# Patient Record
Sex: Male | Born: 1950 | Race: White | Hispanic: No | Smoking: Current every day smoker
Health system: Southern US, Community
[De-identification: ages and names within clinical notes are randomized; demographics above are authoritative.]

## PROBLEM LIST (undated history)

## (undated) DIAGNOSIS — F1010 Alcohol abuse, uncomplicated: Secondary | ICD-10-CM

## (undated) DIAGNOSIS — N400 Enlarged prostate without lower urinary tract symptoms: Secondary | ICD-10-CM

## (undated) DIAGNOSIS — K219 Gastro-esophageal reflux disease without esophagitis: Secondary | ICD-10-CM

## (undated) HISTORY — PX: CHOLECYSTECTOMY: SHX55

---

## 1998-04-15 ENCOUNTER — Emergency Department (HOSPITAL_COMMUNITY): Admission: EM | Admit: 1998-04-15 | Discharge: 1998-04-15 | Payer: Self-pay | Admitting: Emergency Medicine

## 2009-08-29 ENCOUNTER — Inpatient Hospital Stay: Payer: Self-pay | Admitting: Psychiatry

## 2012-04-21 ENCOUNTER — Emergency Department: Payer: Self-pay | Admitting: Unknown Physician Specialty

## 2012-04-21 LAB — BASIC METABOLIC PANEL
Anion Gap: 7 (ref 7–16)
BUN: 9 mg/dL (ref 7–18)
Chloride: 108 mmol/L — ABNORMAL HIGH (ref 98–107)
EGFR (African American): >60
EGFR (Non-African Amer.): >60
Glucose: 85 mg/dL (ref 65–99)
Osmolality: 283 (ref 275–301)
Potassium: 4 mmol/L (ref 3.5–5.1)

## 2012-04-21 LAB — CBC WITH DIFFERENTIAL/PLATELET
Basophil %: 1.3 %
Eosinophil %: 0.7 %
HCT: 40.6 % (ref 40.0–52.0)
HGB: 14.1 g/dL (ref 13.0–18.0)
Lymphocyte #: 2 10*3/uL (ref 1.0–3.6)
MCH: 33.5 pg (ref 26.0–34.0)
MCV: 96 fL (ref 80–100)
Monocyte #: 1.1 x10 3/mm — ABNORMAL HIGH (ref 0.2–1.0)
Neutrophil #: 7.1 10*3/uL — ABNORMAL HIGH (ref 1.4–6.5)
Neutrophil %: 68.2 %
Platelet: 376 10*3/uL (ref 150–440)
RBC: 4.21 10*6/uL — ABNORMAL LOW (ref 4.40–5.90)

## 2012-04-21 LAB — URIC ACID: Uric Acid: 5.7 mg/dL (ref 3.5–7.2)

## 2012-05-23 ENCOUNTER — Ambulatory Visit: Payer: Self-pay

## 2012-06-28 ENCOUNTER — Ambulatory Visit: Payer: Self-pay | Admitting: Internal Medicine

## 2012-10-27 ENCOUNTER — Inpatient Hospital Stay: Payer: Self-pay | Admitting: Internal Medicine

## 2012-10-27 LAB — CBC
HCT: 33.9 % — ABNORMAL LOW (ref 40.0–52.0)
HGB: 11.4 g/dL — ABNORMAL LOW (ref 13.0–18.0)
MCH: 29.2 pg (ref 26.0–34.0)
MCHC: 33.8 g/dL (ref 32.0–36.0)
MCV: 87 fL (ref 80–100)
Platelet: 524 10*3/uL — ABNORMAL HIGH (ref 150–440)
RBC: 3.92 10*6/uL — ABNORMAL LOW (ref 4.40–5.90)

## 2012-10-27 LAB — URINALYSIS, COMPLETE
Glucose,UR: NEGATIVE mg/dL (ref 0–75)
Ketone: NEGATIVE
Specific Gravity: 1.049 (ref 1.003–1.030)
Squamous Epithelial: NONE SEEN

## 2012-10-27 LAB — LIPASE, BLOOD
Lipase: >3000 U/L (ref 73–393)
Lipase: >3000 U/L (ref 73–393)

## 2012-10-27 LAB — CBC WITH DIFFERENTIAL/PLATELET
Basophil #: 0.1 10*3/uL (ref 0.0–0.1)
Eosinophil #: 0 10*3/uL (ref 0.0–0.7)
HCT: 29.7 % — ABNORMAL LOW (ref 40.0–52.0)
HGB: 10 g/dL — ABNORMAL LOW (ref 13.0–18.0)
Lymphocyte #: 1.3 10*3/uL (ref 1.0–3.6)
Lymphocyte %: 6.7 %
Monocyte #: 1.2 x10 3/mm — ABNORMAL HIGH (ref 0.2–1.0)
Neutrophil #: 16.6 10*3/uL — ABNORMAL HIGH (ref 1.4–6.5)
RDW: 16.5 % — ABNORMAL HIGH (ref 11.5–14.5)

## 2012-10-27 LAB — BILIRUBIN, DIRECT: Bilirubin, Direct: 8 mg/dL — ABNORMAL HIGH (ref 0.00–0.20)

## 2012-10-27 LAB — COMPREHENSIVE METABOLIC PANEL
Anion Gap: 8 (ref 7–16)
Bilirubin,Total: 11 mg/dL — ABNORMAL HIGH (ref 0.2–1.0)
Calcium, Total: 9.9 mg/dL (ref 8.5–10.1)
Creatinine: 0.74 mg/dL (ref 0.60–1.30)
EGFR (African American): >60
Osmolality: 275 (ref 275–301)
Sodium: 136 mmol/L (ref 136–145)

## 2012-10-27 LAB — HEPATIC FUNCTION PANEL A (ARMC)
Albumin: 2.5 g/dL — ABNORMAL LOW (ref 3.4–5.0)
Alkaline Phosphatase: 521 U/L — ABNORMAL HIGH (ref 50–136)
Bilirubin,Total: 9.4 mg/dL — ABNORMAL HIGH (ref 0.2–1.0)
SGOT(AST): 46 U/L — ABNORMAL HIGH (ref 15–37)
Total Protein: 6.5 g/dL (ref 6.4–8.2)

## 2012-10-27 LAB — PROTIME-INR
INR: 0.9
Prothrombin Time: 12.3 secs (ref 11.5–14.7)

## 2012-10-28 LAB — CBC WITH DIFFERENTIAL/PLATELET
Basophil #: 0.1 10*3/uL (ref 0.0–0.1)
Basophil %: 0.4 %
Eosinophil #: 0 10*3/uL (ref 0.0–0.7)
HCT: 28.3 % — ABNORMAL LOW (ref 40.0–52.0)
Lymphocyte #: 2 10*3/uL (ref 1.0–3.6)
Lymphocyte %: 12.3 %
Lymphocytes: 8 %
MCH: 29.2 pg (ref 26.0–34.0)
MCV: 88 fL (ref 80–100)
Monocyte %: 7.2 %
Monocytes: 8 %
Neutrophil #: 13.3 10*3/uL — ABNORMAL HIGH (ref 1.4–6.5)
Neutrophil %: 79.9 %
Platelet: 414 10*3/uL (ref 150–440)
RBC: 3.22 10*6/uL — ABNORMAL LOW (ref 4.40–5.90)
RDW: 16 % — ABNORMAL HIGH (ref 11.5–14.5)
RDW: 16.8 % — ABNORMAL HIGH (ref 11.5–14.5)

## 2012-10-28 LAB — BASIC METABOLIC PANEL
BUN: 22 mg/dL — ABNORMAL HIGH (ref 7–18)
Calcium, Total: 8.8 mg/dL (ref 8.5–10.1)
Co2: 28 mmol/L (ref 21–32)
EGFR (African American): >60
EGFR (Non-African Amer.): >60
Glucose: 53 mg/dL — ABNORMAL LOW (ref 65–99)
Osmolality: 282 (ref 275–301)
Sodium: 141 mmol/L (ref 136–145)

## 2012-10-28 LAB — HEPATIC FUNCTION PANEL A (ARMC)
Albumin: 2.4 g/dL — ABNORMAL LOW (ref 3.4–5.0)
Bilirubin, Direct: 8.5 mg/dL — ABNORMAL HIGH (ref 0.00–0.20)
Bilirubin,Total: 10.1 mg/dL — ABNORMAL HIGH (ref 0.2–1.0)
SGOT(AST): 45 U/L — ABNORMAL HIGH (ref 15–37)
SGPT (ALT): 53 U/L (ref 12–78)
Total Protein: 6.6 g/dL (ref 6.4–8.2)

## 2012-10-28 LAB — LIPASE, BLOOD: Lipase: >3000 U/L (ref 73–393)

## 2012-10-28 LAB — COMPREHENSIVE METABOLIC PANEL
Anion Gap: 7 (ref 7–16)
BUN: 19 mg/dL — ABNORMAL HIGH (ref 7–18)
Chloride: 107 mmol/L (ref 98–107)
Co2: 26 mmol/L (ref 21–32)
Glucose: 63 mg/dL — ABNORMAL LOW (ref 65–99)
Osmolality: 280 (ref 275–301)
SGPT (ALT): 56 U/L (ref 12–78)
Sodium: 140 mmol/L (ref 136–145)
Total Protein: 6.5 g/dL (ref 6.4–8.2)

## 2012-11-02 LAB — CULTURE, BLOOD (SINGLE)

## 2014-06-11 IMAGING — CR DG CHEST 2V
1 series · 2 of 2 positions shown · non-contrast
Comparison: none

REASON FOR EXAM: tobacco use disorder
COMMENTS:

[Series 1: pa · 0.17mm/px · 2 of 2 slices shown]
[im 1/2]
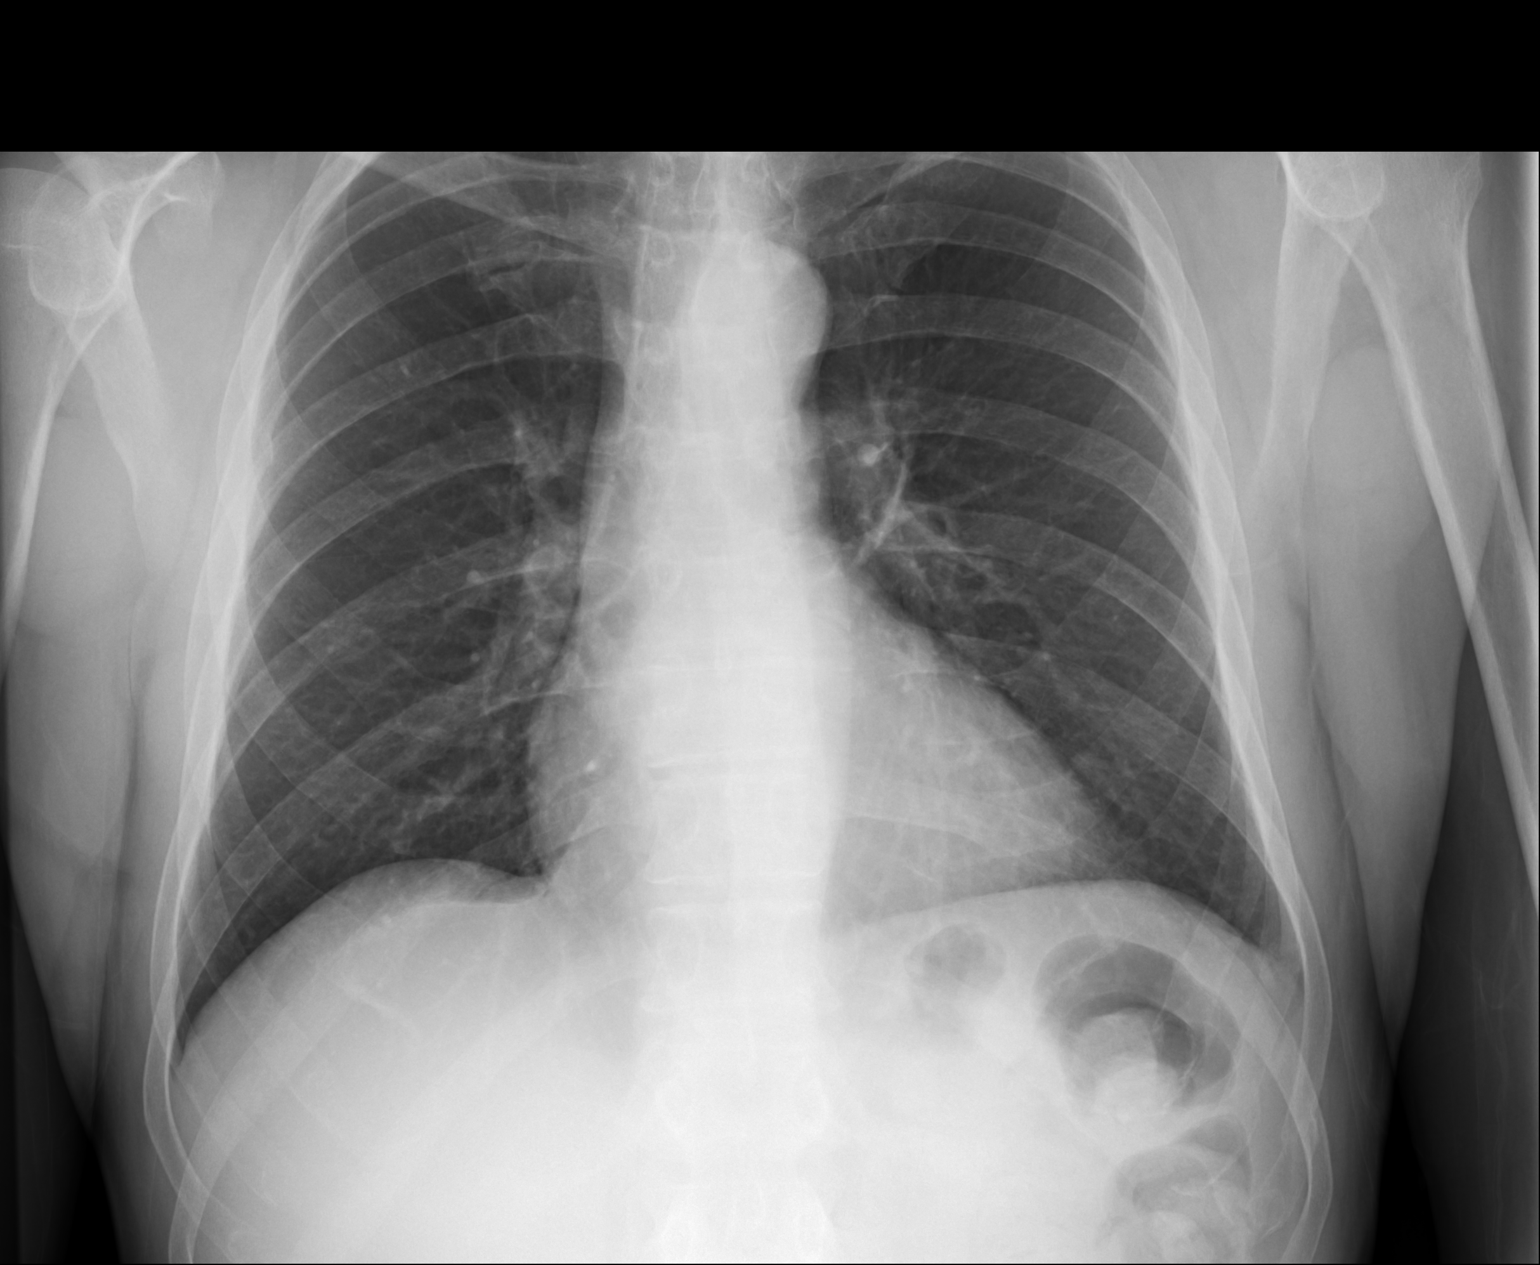
[im 2/2]
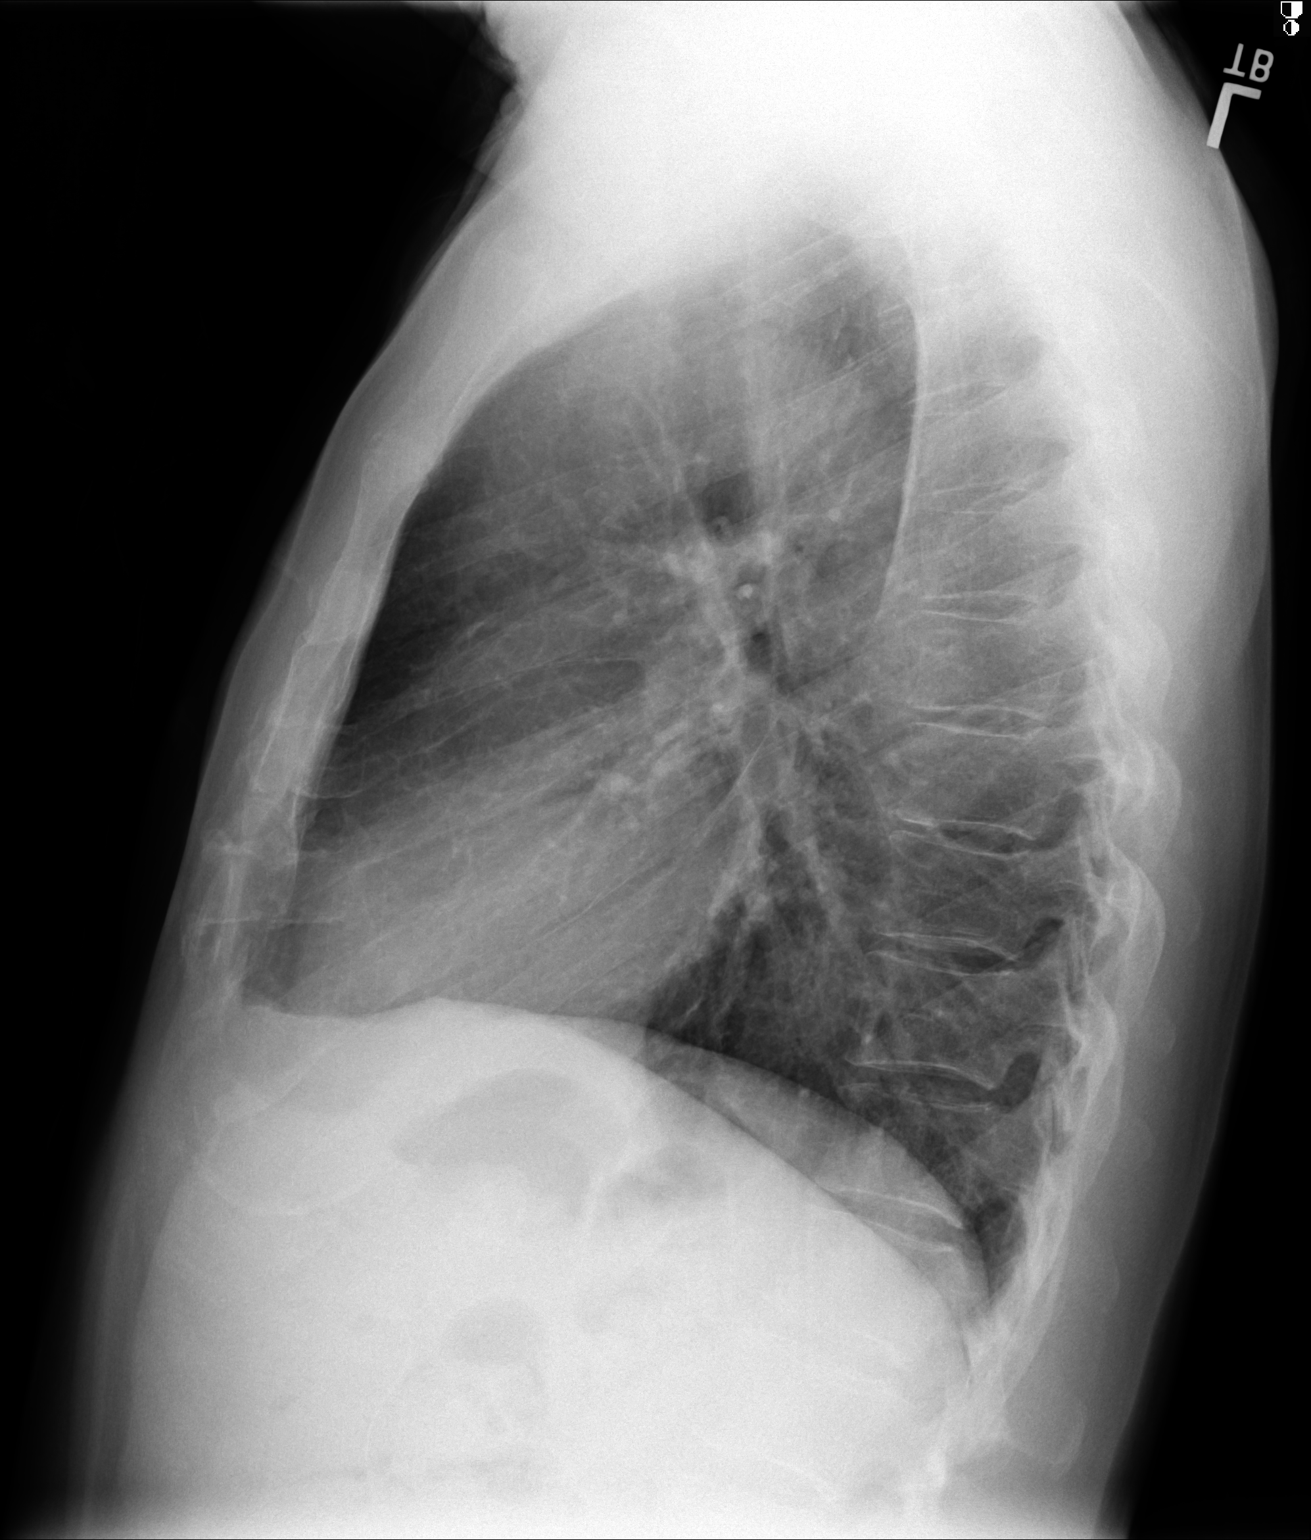

[2 of 2 positions shown; findings below may reference images not displayed]

PROCEDURE:     DXR - DXR CHEST PA (OR AP) AND LATERAL  - May 23, 2012 [DATE]

RESULT:     There is no previous exam for comparison.

The lungs are clear. The heart and pulmonary vessels are normal. The bony
and mediastinal structures are unremarkable. There is no effusion. There is
no pneumothorax or evidence of congestive failure.
IMPRESSION: No acute cardiopulmonary disease.

[REDACTED]

## 2015-01-16 NOTE — Consult Note (Signed)
Chief Complaint:   Subjective/Chief Complaint seen for pancreatitis.  patient with continued abdominal pain, about the same as yesterday.  patietn has had no bm, but has been passing flatus.  no emesis.   VITAL SIGNS/ANCILLARY NOTES: **Vital Signs.:   02-Feb-14 05:32   Vital Signs Type Routine   Temperature Temperature (F) 98.2   Celsius 36.7   Temperature Source Oral   Pulse Pulse 80   Respirations Respirations 18   Systolic BP Systolic BP 116   Diastolic BP (mmHg) Diastolic BP (mmHg) 72   Mean BP 86   Pulse Ox % Pulse Ox % 97   Pulse Ox Activity Level  At rest   Oxygen Delivery Room Air/ 21 %    12:45   Vital Signs Type Routine   Temperature Temperature (F) 97.8   Celsius 36.5   Temperature Source PediatricAxillary   Pulse Pulse 81   Respirations Respirations 20   Systolic BP Systolic BP 145   Diastolic BP (mmHg) Diastolic BP (mmHg) 82   Mean BP 103   Brief Assessment:   Cardiac Regular    Respiratory clear BS    Gastrointestinal details normal tender to palpation generaally ,  mostly in the epigastrum and ruq.  no rebound, bs positive, but decreased from yesterday.  minimal distension.   Assessment/Plan:  Assessment/Plan:   Assessment 1) biliary pancreatitis, choledocholithiasis-stable, some improvement of labs.  Pain seems adequately controlled.  2) h/o previous pancreatitis, remote, likely etoh related 3) h/o etoh abuse, however abstinent for about 6 months.    Plan 1) continue current, low threshold for transfer to tertiary facility for ERCP, as ERCP will not be available here until tomorrow.  Will recheck labs late this afternoon.   Electronic Signatures: Barnetta ChapelSkulskie, Martin (MD)  (Signed 02-Feb-14 15:14)  Authored: Chief Complaint, VITAL SIGNS/ANCILLARY NOTES, Brief Assessment, Assessment/Plan   Last Updated: 02-Feb-14 15:14 by Barnetta ChapelSkulskie, Martin (MD)

## 2015-01-16 NOTE — H&P (Signed)
PATIENT NAME:  Luis [name], Luis [name] MR#:  [id number] DATE OF BIRTH:  16-Jul-1951  DATE OF ADMISSION:  10/27/2012  PRIMARY CARE PROVIDER:  None.  He usually goes to the walk-in clinic  ED REFERRING PHYSICIAN:  Dr. Jasmine December   CHIEF COMPLAINT:  Epigastric pain.   HISTORY OF PRESENT ILLNESS: The patient is a 64 year old white male with previous history of alcohol abuse in the past, but reports that he has not had anything to drink since July 1st who has been having abdominal pain ongoing for about a one-month duration. He describes the pain as a constant type of pain. Initially it would get worse at certain time of the day but now has gotten constant and sharp in nature and progressive. He also has had some emesis since yesterday. He reports that eating food makes it worse. He otherwise has not had any diarrhea; otherwise, reports that he feels hot and cold, but has not had any fevers, denies any chest pains or shortness of breath.   PAST MEDICAL HISTORY:  Significant for: 1.  GERD. 2.  Hypertension.   PAST SURGICAL HISTORY:  None.   ALLERGIES:  None.   MEDICATIONS:  The patient stays on a antihypertensive. He is not sure what the name of it is. He does use ibuprofen 2 to 3 tablets at nighttime to help him sleep.   SOCIAL HISTORY:  He smokes 6 to 7 cigarettes per day.  History of heavy drinking in the past but none since July 1st, according to him. History of cocaine use 4 years ago.   FAMILY HISTORY:  No history of coronary artery disease, hypertension.  REVIEW OF SYSTEMS:   CONSTITUTIONAL:  He complains of feeling hot and cold, but no measurable fevers. Complains of generalized weakness, abdominal pain. Reports 5-pound weight loss.  EYES:  No blurred or double vision. No pain. No redness. No inflammation. No glaucoma. No cataracts.  ENT:  No tinnitus. No ear pain. No hearing loss. No seasonal or year-round allergies. No epistaxis. No nasal discharge. No postnasal drip. No difficulty swallowing.   RESPIRATORY:  Denies any cough, wheezing, hemoptysis. No dyspnea. No asthma. No painful respirations.  CARDIOVASCULAR:  Denies any chest pain, orthopnea, edema or arrhythmia.  GASTROINTESTINAL:  Complains of some nausea, vomiting. Epigastric pain as above. No hematemesis. No melena. Does have history of GERD.  Denies any IBS.  He has not noticed jaundice until he came to the ED and was pointed out that his skin was yellow. Denies any rectal bleeding. No changes in bowel habits.  GENITOURINARY:  Denies any dysuria, hematuria, renal colic or frequency. ENDOCRINE:  Denies any polyuria, nocturia or thyroid problems.  HEMATOLOGIC AND LYMPHATICS:  Denies anemia, easy bruisability or bleeding.  SKIN:  No acne. No rash. No changes in mole, hair or skin.  MUSCULOSKELETAL:  Denies any pain in the neck, back or shoulder.  NEUROLOGIC:  No numbness. No CVA. No TIA.  No seizures.  PSYCHIATRIC: Denies any anxiety, insomnia. No ADD. No OCD. Has had psychiatric admissions for detox.   PHYSICAL EXAMINATION: VITAL SIGNS:  Temperature 98, pulse 100, respirations 18, blood pressure 106/56.  GENERAL: The patient is a well-developed, well-nourished male currently not in any acute distress.  HEENT: Head atraumatic, normocephalic. Pupils equally round, reactive to light and accommodation. The lid has scleral icterus. There is no conjunctivae or pallor. Nasal exam shows no drainage or ulceration.  Oropharynx is clear without any exudate.  NECK:  No thyromegaly. No carotid bruits.  CARDIOVASCULAR:  Regular rate and rhythm. No murmurs, rubs, clicks or gallops. PMI is not displaced.  LUNGS:  Clear to auscultation bilaterally without any rales, rhonchi or wheezing.  ABDOMEN:  Soft. There is mild tenderness. There is no guarding. No rebound. SKIN:  He does have jaundice. There are no other rashes noted.  LYMPHATICS:  No lymph nodes palpable.  MUSCULOSKELETAL:  There is no erythema or swelling.  VASCULAR:  Good DP, PT  pulses.  PSYCHIATRIC:  Not anxious or depressed. NEUROLOGIC:  Awake, alert, oriented x 3. No focal deficits.  PERTINENT LABORATORY EVALUATIONS:  WBC count 28.5, hemoglobin 11.4, platelet count 524, glucose 111, BUN 18, creatinine 0.74, sodium 136, potassium 3.4, chloride 100, CO2 28, bili total 11.0, alk phos is 651, ALT is 73 AST 58, total protein 7.7, albumin of 3.1, lipase greater than 3000, INR 0.9. LDH is 212.  Ultrasound of the abdomen shows a nonspecific area of relative hypoechogenicity which may be within the gallbladder wall versus adjacent to the gallbladder wall measuring 1.5 cm may represent a gallbladder mass versus hepatic mass.  CT scan of the abdomen shows acute pancreatitis likely resulting from 5 mm cholelithiasis at the level of the ampulla of Vater. There is no evidence of pancreatic necrosis.   ASSESSMENT AND PLAN: The patient is a 64 year old white male with history of heavy alcohol use, last use July of last year presents with abdominal pain, has acute gallstone pancreatitis.  1.  Acute gallstone pancreatitis:  At this time, IV fluids, pain control and gastrointestinal evaluation will eventually need surgical evaluation for cholecystectomy.  2.  Leukocytosis:  Likely due to pancreatitis, but due to severity of leukocytosis, I will place him on Zosyn for now.  3.  Gastroesophageal reflux disease:  Will place him on IV proton pump inhibitors.  4.  Hypertension:  Blood pressure is currently normal, unknown home medication.  Hold for now.  5.  Miscellaneous:  Will place him on Lovenox for deep vein thrombosis prophylaxis.  TIME SPENT:  35 minutes   ____________________________ Chana Bode H. Posey Pronto, MD shp:ce D: 10/27/2012 13:41:10 ET T: 10/27/2012 15:08:38 ET JOB#: [id number]  cc: Himmat Enberg H. Posey Pronto, MD, <Dictator> Alric Seton MD ELECTRONICALLY SIGNED 10/28/2012 16:32

## 2015-01-16 NOTE — Consult Note (Signed)
Brief Consult Note: Diagnosis: biliary pancreatitis, obstructive jaundice.   Patient was seen by consultant.   Consult note dictated.   Recommend further assessment or treatment.   Discussed with Attending MD.   Comments: Patient seen and examined.  Patient admitted with jaundice, pancreatitis.   CT indicating stone impacted at the level of the ampulla.  Patient does not appear toxic or overtly ill, pain seems to be controlled, no abdominal distension.   Recommend to increase ivf rate, low threshold for transfer to tertiary institution over the weekend if there is any evidence of decline.  ERCP may be needed to open the dut/removal of stone, and this is not available at [facility] until monday.  Discussed with Dr Eliane DecreeS Patel. Will recheck labs this evening and am.  Electronic Signatures: Barnetta ChapelSkulskie, Leavy Heatherly (MD)  (Signed 01-Feb-14 19:01)  Authored: Brief Consult Note   Last Updated: 01-Feb-14 19:01 by Barnetta ChapelSkulskie, Verne Lanuza (MD)

## 2015-01-16 NOTE — Consult Note (Signed)
PATIENT NAME:  Luis [name], Kin MR#:  [id number] DATE OF BIRTH:  01-May-1951  DATE OF CONSULTATION:  10/28/2012  REFERRING PHYSICIAN:   CONSULTING PHYSICIAN:  Christena DeemMartin U. Skulskie, MD  REASON FOR CONSULTATION: Pancreatitis.   HISTORY OF PRESENT ILLNESS: The patient is a 64 year old Caucasian male who presented today to the Emergency Room with a complaint of nausea, vomiting and epigastric pain. He states he has been having abdominal pain intermittently for about 5 weeks. He says it increases in the evening. He has felt weak. His stomach has continued to hurt. He has had problems of  increasing frequency of nausea and emesis. Several weeks ago, he thought he had an ulcer but as his symptoms got worse over the past couple of days he decided to come to the ER. Pain increases with eating. He states he has had a weight loss about 8 to 10 pounds over the period of the past 2 months. He states he has been taking ibuprofen 2 to 3 a night. This is for problems with gout as well as the abdominal pain. He states that his urine became dark about 2 days ago. Although he denies pancreatitis in the past, review of his chart indicates not only an alcohol history but also a history of pancreatitis in the past. He states that this is "first time I have been in the hospital." He states that he has had no alcohol for about 8 months. Previous to that, he used to drink 6 to 12 beers on a daily basis for a number of years. He has a history of being seen at the [facility] for routine medical problems. He had a barium swallow done on 06/28/2012 because of reflux. This stated that a barium pill passed without difficulty. There was a small reducible hiatal hernia, asymmetry of the hypopharyngeal wall on the right compared to the left. Direct visualization was recommended. Currently, he states his pain is 8 out of 10 although he appears to be relatively comfortable.   PAST MEDICAL HISTORY:  He has a history of gastroesophageal  reflux, hypertension and gout.  History of pancreatitis as noted. History of alcohol abuse as noted, states no alcohol since this past July. There is a remote history of cocaine use.   GI FAMILY HISTORY: Negative for colorectal cancer, liver disease, or ulcers. His father did have a history of coronary artery disease and coronary bypasses.   REVIEW OF SYSTEMS: Ten systems reviewed per admission history and physical, agree with same. It is of note that he has had detox in the past for alcohol abuse and he has undergone alcohol withdrawal in the past.   PHYSICAL EXAMINATION: VITAL SIGNS: Temperature 97.9, pulse 76, respirations 18, blood pressure 123/80.  GENERAL: He is a 64 year old Caucasian male, no acute distress, thin.  HEENT: Normocephalic, atraumatic. Eyes show a scleral icterus. Nose septum midline. Oropharynx fair dentition.  NECK: No JVD.  HEART: Regular rate and rhythm.  LUNGS: Clear.  ABDOMEN: Soft. He is tender to palpation throughout the epigastrium, less so into the left upper quadrant but more so into the right upper quadrant. There are no masses or rebound. Bowel sounds are positive.  EXTREMITIES: No clubbing, cyanosis or edema.  NEUROLOGICAL: Cranial nerves II through XII grossly intact. Muscle strength bilaterally equal and symmetric. DTRs bilaterally equal and symmetric.   LABORATORY DATA: Includes the following:  He had a glucose of 111, BUN 18, creatinine 0.74, sodium 136, potassium 3.4, chloride 100, bicarb 28, calcium 9.9. LDH 212,  lipase greater than 3000. Hepatic profile showing a total protein of 7.7, albumin of 3.1, total bilirubin this morning at 10:26 was 11.0, direct bilirubin at 16:18 was 8.0, alkaline phosphatase 651, AST 58, ALT 73. His hemogram shows a white count of 28.5, H and H  of 11.4/33.9, platelet count 524, MCV is 87. INR was 0.9. Urinalysis shows 2+ bilirubin, negative protein, negative nitrite, negative leukocyte esterase.   He has had 2 imaging studies.  He had an abdominal ultrasound for right upper quadrant pain and jaundice, this showing no cholelithiasis or sonographic evidence of acute cholecystitis. It did, however, indicate that there was a nonspecific dilatation of the common bile duct without evidence of obstructing mass or choledocholithiasis. Recommendation was for ERCP or MRCP.  There was an unusual finding in that there was a nonspecific area of relative hypoechogenicity along the gallbladder wall versus adjacent to the gallbladder wall measuring 1.5 cm in size, possibly a gallbladder wall mass versus a hepatic mass abutting the gallbladder. There was a CT scan of the abdomen and pelvis done with contrast, this showing peripancreatic inflammatory changes around the pancreatic head consistent with pancreatitis. There was a 5 mm stone in the common bile duct common with common bile duct dilatation up to 15 mm. There was some mild intrahepatic biliary ductal dilatation. There was likely ileus.   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 There is evidence of biliary ductal dilatation and obstructive jaundice. The patient currently is stable. He does, however, have a markedly elevated white count. He has been placed on IV antibiotics. My concern is for further problems related to common bile duct obstruction including worsening of the pancreatitis. ERCP is not available at [facility] until Monday. MRCP  would be helpful in looking at the duct, however, the CT scan was fairly explicit in the finding of choledocholithiasis and ductal dilatation. Laboratory-wise this looks to be also obstructive jaundice in addition to biliary pancreatitis.   RECOMMENDATION:   1.  Continue antibiotics as you are. We will recheck laboratories tonight and again in the morning. If there is any evidence of decompensation with change of kidney function, would recommend transfer to tertiary institution for further  evaluation. We will follow with you.  2.  Would increase his IV fluid rate from 175 mL to 200 mL/h.     ____________________________ Christena Deem, MD mus:cs D: 10/27/2012 18:51:28 ET T: 10/28/2012 15:52:59 ET JOB#: [id number]  cc: Christena Deem, MD, <Dictator> Christena Deem MD ELECTRONICALLY SIGNED 11/02/2012 6:14

## 2015-01-16 NOTE — Discharge Summary (Signed)
DATE OF BIRTH:  March 28, 1951  DATE OF ADMISSION:  10/27/2012 DATE OF DISCHARGE:  11/15/2012  REASON FOR TRANSFER:  Unavailability of ERCP at [facility].   TRANSFER TO:  [facility].   ACCEPTING PHYSICIAN:  Dr. Payton Doughty.  TRANSFER DIAGNOSES: 1.  Gallstone pancreatitis with ampulla of Vater stone.  2.  Obstructive jaundice.  3.  Leukocytosis.  4.  Normocytic anemia.  5.  Hypertension.  6.  Sepsis.   CONSULTANTS:  Dr. Gustavo Lah of GI.   IMAGING STUDIES:   1.  A CT scan of the abdomen and pelvis showed acute pancreatitis with 5-mm choledocholithiasis at level of ampulla of Vater.  2.  Ultrasound of the abdomen showed no cholecystitis or gallstones.   ADMITTING HISTORY AND PHYSICAL:  Please see detailed H and P dictated on 10/27/1012.   A 64 year old white male patient with prior history of alcohol abuse presented to the hospital with acute onset of epigastric pain. The patient was found to have acute pancreatitis with lipase greater than 3000 and CT scan showing acute pancreatitis with 5-mm ampulla of Vater gallstone. The patient was admitted to the hospitalist service for an ERCP.   HOSPITAL COURSE:  The patient was admitted onto a medical floor, aggressive IV fluid hydration with IV antibiotics of Zosyn. The patient did have obstructive jaundice with elevated bilirubin. Dr. Gustavo Lah of GI was constituted. Initially, the patient was kept at [facility] to see if any ERCP can be done, but ERCP is unavailable at this time, and the patient is being transferred to [facility]. Case was discussed with Dr. Payton Doughty at [facility], who has graciously accepted transfer, and the patient will be transferred when a bed is available.   Presently, the patient's vitals are temperature 97.8, pulse of 81, respirations 20, blood pressure 145/82, saturating 97% on room air.   The patient's initial liver function tests showed bilirubin of 11, alkaline phosphatase  651 with AST of 58 and ALT 73. The last blood work shows bilirubin 10.1 with alk phos of 499. His white blood cell count has trended down from 28.5 to 14.3. The patient is afebrile at this time.   ACTIVE INPATIENT MEDICATIONS INCLUDE:  1.  Lovenox 40 mg subcutaneous once a day.  2.  Dilaudid 1 to 2 mg IV q. 4 hours p.r.n.  3.  Zofran 4 mg IV q. 4 p.r.n.  4.  Protonix 40 mg IV once a day.  5.  Zosyn 3.375 grams IV q. 8 hours.   Thank you for accepting the transfer.   Total time spent on this severely ill patient with obstructive jaundice, needing emergent transfer to [facility] for an ERCP, was 35 minutes.    ____________________________ Leia Alf Romani Wilbon, MD srs:ms D: 10/28/2012 18:53:13 ET T: 10/28/2012 19:38:03 ET JOB#: [id number]  cc: Alveta Heimlich R. Darvin Neighbours, MD, <Dictator> Dr. Johnna Acosta, MD Neita Carp MD ELECTRONICALLY SIGNED 11/01/2012 13:18

## 2015-01-16 NOTE — Consult Note (Signed)
Chief Complaint:   Subjective/Chief Complaint labs repeated this evening.  although the wbc is improving, bili is increasing, minimal change of other lfts.  Gallstone likely still  in distal cbd,  no availability of ercp here until tomottow, possibly pm.  Discussed with Dr Elpidio AnisSudini, recommend transfer to tertiary instution for ercp.   VITAL SIGNS/ANCILLARY NOTES:  **Vital Signs.:   02-Feb-14 12:45   Vital Signs Type Routine   Temperature Temperature (F) 97.8   Celsius 36.5   Temperature Source PediatricAxillary   Pulse Pulse 81   Respirations Respirations 20   Systolic BP Systolic BP 145   Diastolic BP (mmHg) Diastolic BP (mmHg) 82   Mean BP 103   Electronic Signatures: Barnetta ChapelSkulskie, Wyndell Cardiff (MD)  (Signed 02-Feb-14 17:36)  Authored: Chief Complaint, VITAL SIGNS/ANCILLARY NOTES   Last Updated: 02-Feb-14 17:36 by Barnetta ChapelSkulskie, Galen Malkowski (MD)

## 2015-06-18 ENCOUNTER — Inpatient Hospital Stay: Payer: Medicare Other

## 2015-06-18 ENCOUNTER — Inpatient Hospital Stay
Admission: EM | Admit: 2015-06-18 | Discharge: 2015-06-20 | DRG: 184 | Disposition: A | Discharge status: Home / Self Care | Payer: Medicare Other | Attending: Internal Medicine | Admitting: Internal Medicine

## 2015-06-18 ENCOUNTER — Encounter: Payer: Self-pay | Admitting: Emergency Medicine

## 2015-06-18 DIAGNOSIS — S2242XA Multiple fractures of ribs, left side, initial encounter for closed fracture: Principal | ICD-10-CM | POA: Diagnosis present

## 2015-06-18 DIAGNOSIS — F17210 Nicotine dependence, cigarettes, uncomplicated: Secondary | ICD-10-CM | POA: Diagnosis present

## 2015-06-18 DIAGNOSIS — K219 Gastro-esophageal reflux disease without esophagitis: Secondary | ICD-10-CM | POA: Diagnosis present

## 2015-06-18 DIAGNOSIS — E860 Dehydration: Secondary | ICD-10-CM | POA: Diagnosis present

## 2015-06-18 DIAGNOSIS — W010XXA Fall on same level from slipping, tripping and stumbling without subsequent striking against object, initial encounter: Secondary | ICD-10-CM | POA: Diagnosis present

## 2015-06-18 DIAGNOSIS — R0781 Pleurodynia: Secondary | ICD-10-CM

## 2015-06-18 DIAGNOSIS — R0789 Other chest pain: Secondary | ICD-10-CM

## 2015-06-18 DIAGNOSIS — F1010 Alcohol abuse, uncomplicated: Secondary | ICD-10-CM | POA: Diagnosis present

## 2015-06-18 DIAGNOSIS — E871 Hypo-osmolality and hyponatremia: Secondary | ICD-10-CM

## 2015-06-18 HISTORY — DX: Gastro-esophageal reflux disease without esophagitis: K21.9

## 2015-06-18 HISTORY — DX: Alcohol abuse, uncomplicated: F10.10

## 2015-06-18 LAB — APTT: aPTT: 34 seconds (ref 24–36)

## 2015-06-18 LAB — CBC WITH DIFFERENTIAL/PLATELET
Basophils Absolute: 0 10*3/uL (ref 0–0.1)
Basophils Relative: 1 %
EOS ABS: 0.1 10*3/uL (ref 0–0.7)
Eosinophils Relative: 2 %
HEMATOCRIT: 38.1 % — AB (ref 40.0–52.0)
HEMOGLOBIN: 12.9 g/dL — AB (ref 13.0–18.0)
LYMPHS ABS: 0.9 10*3/uL — AB (ref 1.0–3.6)
Lymphocytes Relative: 16 %
MCH: 31.5 pg (ref 26.0–34.0)
MCHC: 33.9 g/dL (ref 32.0–36.0)
MCV: 92.9 fL (ref 80.0–100.0)
MONOS PCT: 9 %
Monocytes Absolute: 0.5 10*3/uL (ref 0.2–1.0)
NEUTROS ABS: 4.2 10*3/uL (ref 1.4–6.5)
NEUTROS PCT: 72 %
Platelets: 220 10*3/uL (ref 150–440)
RBC: 4.1 MIL/uL — ABNORMAL LOW (ref 4.40–5.90)
RDW: 16.9 % — ABNORMAL HIGH (ref 11.5–14.5)
WBC: 5.7 10*3/uL (ref 3.8–10.6)

## 2015-06-18 LAB — PROTIME-INR
INR: 1.03
Prothrombin Time: 13.7 seconds (ref 11.4–15.0)

## 2015-06-18 LAB — TROPONIN I: Troponin I: <0.03 ng/mL (ref <0.031)

## 2015-06-18 LAB — SODIUM
SODIUM: 119 mmol/L — AB (ref 135–145)
Sodium: 120 mmol/L — ABNORMAL LOW (ref 135–145)
Sodium: 122 mmol/L — ABNORMAL LOW (ref 135–145)

## 2015-06-18 LAB — SODIUM, URINE, RANDOM

## 2015-06-18 LAB — ETHANOL: Alcohol, Ethyl (B): 275 mg/dL — ABNORMAL HIGH (ref <5)

## 2015-06-18 MED ORDER — SODIUM CHLORIDE 0.9 % IJ SOLN
3.0000 mL | Freq: Two times a day (BID) | INTRAMUSCULAR | Status: DC
  Administered 2015-06-18 – 2015-06-19 (×2): 3 mL via INTRAVENOUS

## 2015-06-18 MED ORDER — FOLIC ACID 1 MG PO TABS
1.0000 mg | ORAL_TABLET | Freq: Every day | ORAL | Status: DC
  Administered 2015-06-18 – 2015-06-20 (×3): 1 mg via ORAL
  Filled 2015-06-18 (×3): qty 1

## 2015-06-18 MED ORDER — PANTOPRAZOLE SODIUM 40 MG PO TBEC
40.0000 mg | DELAYED_RELEASE_TABLET | Freq: Every day | ORAL | Status: DC
  Administered 2015-06-19 – 2015-06-20 (×2): 40 mg via ORAL
  Filled 2015-06-18 (×2): qty 1

## 2015-06-18 MED ORDER — ADULT MULTIVITAMIN W/MINERALS CH
1.0000 | ORAL_TABLET | Freq: Every day | ORAL | Status: DC
  Administered 2015-06-18 – 2015-06-20 (×3): 1 via ORAL
  Filled 2015-06-18 (×3): qty 1

## 2015-06-18 MED ORDER — LORAZEPAM 1 MG PO TABS
1.0000 mg | ORAL_TABLET | Freq: Four times a day (QID) | ORAL | Status: DC | PRN
  Administered 2015-06-18: 13:00:00 1 mg via ORAL
  Filled 2015-06-18 (×6): qty 1

## 2015-06-18 MED ORDER — ACETAMINOPHEN 650 MG RE SUPP: 650.0000 mg | Freq: Four times a day (QID) | RECTAL | Status: DC | PRN

## 2015-06-18 MED ORDER — VITAMIN B-1 100 MG PO TABS
100.0000 mg | ORAL_TABLET | Freq: Every day | ORAL | Status: DC
  Administered 2015-06-19 – 2015-06-20 (×2): 100 mg via ORAL
  Filled 2015-06-18 (×3): qty 1

## 2015-06-18 MED ORDER — LORAZEPAM 2 MG/ML IJ SOLN: 1.0000 mg | Freq: Four times a day (QID) | INTRAMUSCULAR | Status: DC | PRN

## 2015-06-18 MED ORDER — LORAZEPAM 2 MG PO TABS
0.0000 mg | ORAL_TABLET | Freq: Four times a day (QID) | ORAL | Status: AC
  Administered 2015-06-18 – 2015-06-19 (×4): 1 mg via ORAL

## 2015-06-18 MED ORDER — OXYCODONE HCL 5 MG PO TABS: 5.0000 mg | ORAL_TABLET | Freq: Four times a day (QID) | ORAL | Status: DC | PRN

## 2015-06-18 MED ORDER — ENOXAPARIN SODIUM 40 MG/0.4ML ~~LOC~~ SOLN
40.0000 mg | SUBCUTANEOUS | Status: DC
  Administered 2015-06-18 – 2015-06-20 (×3): 40 mg via SUBCUTANEOUS
  Filled 2015-06-18 (×3): qty 0.4

## 2015-06-18 MED ORDER — SODIUM CHLORIDE 0.9 % IV BOLUS (SEPSIS)
1000.0000 mL | Freq: Once | INTRAVENOUS | Status: AC
  Administered 2015-06-18: 1000 mL via INTRAVENOUS

## 2015-06-18 MED ORDER — ACETAMINOPHEN 325 MG PO TABS
650.0000 mg | ORAL_TABLET | Freq: Four times a day (QID) | ORAL | Status: DC | PRN
  Administered 2015-06-19 – 2015-06-20 (×3): 650 mg via ORAL
  Filled 2015-06-18 (×3): qty 2

## 2015-06-18 MED ORDER — THIAMINE HCL 100 MG/ML IJ SOLN
Freq: Once | INTRAVENOUS | Status: AC
  Administered 2015-06-18: 13:00:00 via INTRAVENOUS
  Filled 2015-06-18: qty 1000

## 2015-06-18 MED ORDER — THIAMINE HCL 100 MG/ML IJ SOLN
100.0000 mg | Freq: Every day | INTRAMUSCULAR | Status: DC
  Filled 2015-06-18: qty 2

## 2015-06-18 MED ORDER — LORAZEPAM 2 MG PO TABS: 0.0000 mg | ORAL_TABLET | Freq: Two times a day (BID) | ORAL | Status: DC

## 2015-06-18 MED ORDER — SODIUM CHLORIDE 0.9 % IV SOLN
INTRAVENOUS | Status: DC
  Administered 2015-06-18: 23:00:00 via INTRAVENOUS

## 2015-06-18 NOTE — Plan of Care (Signed)
Problem: Discharge Progression Outcomes Goal: Hemodynamically stable Individualization of care . Pt resides at [facility] . Hx of alcohol abuse. Came in with Na level of 115. Outcome: Not Progressing Na  Level 115.  Na levels ordered  Specific times. Will moniter levels and  Report  To md for further   orderes if needed Goal: Complications resolved/controlled Outcome: Not Progressing See above note Goal: Tolerating diet Outcome: Progressing Regular diet. Pt reports  Having not eaten in a week. Will moniter diet  And ability  To tol.

## 2015-06-18 NOTE — H&P (Signed)
[facility] Physicians - [place] at [facility]   PATIENT NAME: Luis [name]    MR#:  [id number]  DATE OF BIRTH:  1950/11/10  DATE OF ADMISSION:  06/18/2015  PRIMARY CARE PHYSICIAN: No PCP Per Patient   REQUESTING/REFERRING PHYSICIAN: Dr Minna Antis  CHIEF COMPLAINT:   Chief Complaint  Patient presents with  . Fall    HISTORY OF PRESENT ILLNESS:  Luis [name]  is a 64 y.o. male with a known history of gastroesophageal reflux disease and alcohol abuse. He was out drinking last night and he slipped on his flip flops with the rain and he had a fall onto the bushes. The police picked him up and called EMS. The patient states that he was drinking last night 12 beers last night and his last drink was around 3 AM. Of note last week he was moving cushions on the couch and his side hurts. He has not been eating in the past one week he has no appetite. He continues to drink 8-10 beers a day. In the ER he was found to have a sodium of 115 and hospitalist services were contacted for further evaluation.  PAST MEDICAL HISTORY:   Past Medical History  Diagnosis Date  . GERD (gastroesophageal reflux disease)   . Alcohol abuse     PAST SURGICAL HISTORY:   Past Surgical History  Procedure Laterality Date  . Cholecystectomy      SOCIAL HISTORY:   Social History  Substance Use Topics  . Smoking status: Current Every Day Smoker -- 0.50 packs/day  . Smokeless tobacco: Not on file  . Alcohol Use: 36.0 oz/week    60 Cans of beer per week    FAMILY HISTORY:   Family History  Problem Relation Age of Onset  . Alcohol abuse Mother   . Alcohol abuse Father     DRUG ALLERGIES:  No Known Allergies  REVIEW OF SYSTEMS:  CONSTITUTIONAL: No fever. Positive for fatigue. Positive for weight loss 6 pounds. Positive for chills EYES: No blurred or double vision. Wears reading glasses. Positive for dysphagia occasionally to solids. EARS, NOSE, AND THROAT: No tinnitus or ear  pain. No sore throat RESPIRATORY: Positive for cough cream colored phlegm. No shortness of breath, wheezing or hemoptysis.  CARDIOVASCULAR: No chest pain. Lower left rib pain GASTROINTESTINAL: No nausea, vomiting, or abdominal pain. No blood in bowel movements. Positive for diarrhea GENITOURINARY: No dysuria, hematuria.  ENDOCRINE: No polyuria, nocturia,  HEMATOLOGY: No anemia, easy bruising or bleeding SKIN: No rash or lesion. MUSCULOSKELETAL: No joint pain or arthritis.   NEUROLOGIC: No tingling, numbness, weakness. Unsure if he passed out.  PSYCHIATRY: No anxiety or depression.   MEDICATIONS AT HOME:   Prior to Admission medications   Medication Sig Start Date End Date Taking? Authorizing [name]  esomeprazole (NEXIUM) 20 MG capsule Take 20 mg by mouth daily at 12 noon.   Yes Historical Georgeanna Radziewicz, MD      VITAL SIGNS:  Blood pressure 134/82, pulse 75, temperature 97.5 F (36.4 C), temperature source Oral, resp. rate 13, height 6' (1.829 m), weight 77.111 kg (170 lb), SpO2 93 %.  PHYSICAL EXAMINATION:  GENERAL:  64 y.o.-year-old patient lying in the bed with no acute distress.  EYES: Pupils equal, round, reactive to light and accommodation. No scleral icterus. Extraocular muscles intact.  HEENT: Head atraumatic, normocephalic. Oropharynx and nasopharynx clear.  NECK:  Supple, no jugular venous distention. No thyroid enlargement, no tenderness.  LUNGS: Normal breath sounds bilaterally, slight expiratory wheezing  right lung, no rales,rhonchi or crepitation. No use of accessory muscles of respiration.  CARDIOVASCULAR: S1, S2 normal. No murmurs, rubs, or gallops. Pain to palpation over left lower rib anteriorly ABDOMEN: Soft, nontender, nondistended. Bowel sounds present. No organomegaly or mass.  EXTREMITIES: No pedal edema, cyanosis, or clubbing.  NEUROLOGIC: Cranial nerves II through XII are intact. Muscle strength 5/5 in all extremities. Sensation intact. Gait not checked.   PSYCHIATRIC: The patient is alert and oriented x 3.  SKIN: No rash, lesion, or ulcer.   LABORATORY PANEL:   CBC  Recent Labs Lab 06/18/15 0929  WBC 5.7  HGB 12.9*  HCT 38.1*  PLT 220   ------------------------------------------------------------------------------------------------------------------  Chemistries   Recent Labs Lab 06/18/15 0929  NA 115*  K 3.7  CL 80*  CO2 22  GLUCOSE 109*  BUN <5*  CREATININE 0.89  CALCIUM 8.9  AST 189*  ALT 117*  ALKPHOS 136*  BILITOT 1.2   ------------------------------------------------------------------------------------------------------------------  Cardiac Enzymes  Recent Labs Lab 06/18/15 0929  TROPONINI <0.03   ------------------------------------------------------------------------------------------------------------------    EKG:   Normal sinus rhythm with First-degree AV block and left axis deviation Q waves inferiorly.  IMPRESSION AND PLAN:   1. Severe hyponatremia. Likely this is beer Potomania. I will get serial sodiums. Give IV fluid hydration with normal saline. Send off a urine osmolarity and urine sodium. The patient is holding a conversation with me so likely this is gone down over a gradual period of time. 2. Alcohol abuse and elevated liver function tests. Patient interested in quitting drinking. He has never stopped drinking before so he doesn't know if he will go through withdrawal or not. I will put on Ciwa protocol. 3. Tobacco abuse- smoking cessation counseling done 3 minutes by me refused nicotine patch. 4. Gastroesophageal reflux disease without esophagitis- patient on Nexium as outpatient will put on Protonix while here 5. Left rib pain- I will get x-ray of the left ribs and chest x-ray.   All the records are reviewed and case discussed with ED Ellianah Cordy. Management plans discussed with the patient, family and they are in agreement.  CODE STATUS: Full code  TOTAL TIME TAKING CARE OF THIS  PATIENT: 50 minutes.    Alford Highland M.D on 06/18/2015 at 10:49 AM  Between 7am to 6pm - Pager - [phone]  After 6pm call admission pager [phone]  [facility] Hospitalists  Office  [phone]  CC: Primary care physician; No PCP Per Patient

## 2015-06-18 NOTE — Plan of Care (Signed)
Problem: Discharge Progression Outcomes Goal: Activity appropriate for discharge plan Outcome: Not Progressing Pt  Unsteady on feet.  Instructed to call for assist  To be oob.   Goal: Other Discharge Outcomes/Goals Outcome: Progressing Plan to go back to [place] [facility] per  Pt.

## 2015-06-18 NOTE — ED Provider Notes (Signed)
[facility] Emergency Department Provider Note  Time seen: 9:14 AM  I have reviewed the triage vital signs and the nursing notes.   HISTORY  Chief Complaint Fall    HPI Luis [name] is a 64 y.o. male with a past medical history of gastric reflux and alcohol abuse presents the emergency department for evaluation. According to EMS the patient was found in some bushes in downtown [place]. Patient states he fell into the bushes. Admits to drinking alcohol overnight. Denies hitting his head or any loss of consciousness. Denies any weakness or numbness, denies any headache. Patient states he has not eaten for several days and is feeling weak. Denies any pain complaints.     No past medical history on file.  There are no active problems to display for this patient.   No past surgical history on file.  No current outpatient prescriptions on file.  Allergies Review of patient's allergies indicates not on file.  No family history on file.  Social History Social History  Substance Use Topics  . Smoking status: Not on file  . Smokeless tobacco: Not on file  . Alcohol Use: Not on file    Review of Systems Constitutional: Negative for fever. Positive for generalized weakness. Cardiovascular: Negative for chest pain. Respiratory: Negative for shortness of breath. Gastrointestinal: Negative for abdominal pain Musculoskeletal: Negative for back pain. Negative for neck pain. Skin: No abrasions or lacerations  Neurological: Negative for headaches, focal weakness or numbness. 10-point ROS otherwise negative.  ____________________________________________   PHYSICAL EXAM:  Constitutional: Alert and oriented. Well appearing and in no distress. Admits recent alcohol intake. Eyes: Normal exam, 2 mm PERRL. ENT   Head: Normocephalic and atraumatic.   Mouth/Throat: Mucous membranes are moist. Cardiovascular: Normal rate, regular rhythm. No  murmur Respiratory: Normal respiratory effort without tachypnea nor retractions. Breath sounds are clear and equal bilaterally. No wheezes/rales/rhonchi. Gastrointestinal: Soft and nontender. No distention.   Musculoskeletal: Nontender with normal range of motion in all extremities. No lower extremity tenderness or edema. Neurologic:  Normal speech and language. No gross focal neurologic deficits are appreciated. Speech is normal. Skin:  Skin is warm, dry and intact. No lacerations/abrasions noted. Psychiatric: Mood and affect are normal. Speech and behavior are normal. Patient exhibits appropriate insight and judgment.  ____________________________________________    EKG  EKG reviewed and interpreted by myself shows normal sinus rhythm at 83 bpm, narrow QRS, left axis deviation, prolonged PR interval consistent with first-degree AV block. No ST changes noted.  ____________________________________________     INITIAL IMPRESSION / ASSESSMENT AND PLAN / ED COURSE  Pertinent labs & imaging results that were available during my care of the patient were reviewed by me and considered in my medical decision making (see chart for details).  Patient presents to the emergency department for evaluation. Patient was found in bushes, and somebody called EMS. Patient admits alcohol intoxication. States he fell into the bushes but denies hitting his head or loss of consciousness. Denies weakness or numbness. Denies any pain. His only complaint is of hunger and weakness. We will check basic labs on the patient, monitor in the emergency department, and feed him.  Labs have resulted showing hyponatremia. I discussed this with the patient, he admits drinking large amounts of alcohol without consuming any food. Patient is agreeable to admission. We will start on IV fluids, patient is eating currently. Denies any known seizures.  ____________________________________________   FINAL CLINICAL IMPRESSION(S) /  ED DIAGNOSES  Hyponatremia Alcohol intoxication Generalized  weakness   Minna Antis, MD 06/18/15 1019

## 2015-06-18 NOTE — ED Notes (Signed)
Critical value sodium from lab of 115. Dr Lenard Lance notified

## 2015-06-18 NOTE — ED Notes (Signed)
Report called to [place], RN.  

## 2015-06-18 NOTE — Progress Notes (Signed)
Clinical Child psychotherapist (CSW) received substance abuse consult. CSW attempted to meet with patient however he was asleep. CSW will attempt to meet with patient at a later time.   Jetta Lout, LCSWA [phone]

## 2015-06-18 NOTE — ED Notes (Signed)
Pt via ems from [place] where he fell after a night of drinking. He admits to 10 beers last night. Pt alert & laughing during triage.

## 2015-06-18 NOTE — ED Notes (Signed)
Dr Weiting at bedside. 

## 2015-06-19 LAB — BASIC METABOLIC PANEL
ANION GAP: 8 (ref 5–15)
BUN: 5 mg/dL — AB (ref 6–20)
CALCIUM: 8.7 mg/dL — AB (ref 8.9–10.3)
CO2: 24 mmol/L (ref 22–32)
Chloride: 93 mmol/L — ABNORMAL LOW (ref 101–111)
Creatinine, Ser: 0.78 mg/dL (ref 0.61–1.24)
GFR calc Af Amer: >60 mL/min (ref >60)
GFR calc non Af Amer: >60 mL/min (ref >60)
GLUCOSE: 93 mg/dL (ref 65–99)
Potassium: 4.2 mmol/L (ref 3.5–5.1)
SODIUM: 125 mmol/L — AB (ref 135–145)

## 2015-06-19 LAB — COMPREHENSIVE METABOLIC PANEL
ALBUMIN: 4 g/dL (ref 3.5–5.0)
ALK PHOS: 136 U/L — AB (ref 38–126)
ALT: 117 U/L — ABNORMAL HIGH (ref 17–63)
ANION GAP: 13 (ref 5–15)
AST: 189 U/L — ABNORMAL HIGH (ref 15–41)
BUN: <5 mg/dL — ABNORMAL LOW (ref 6–20)
CALCIUM: 8.9 mg/dL (ref 8.9–10.3)
CO2: 22 mmol/L (ref 22–32)
Chloride: 80 mmol/L — ABNORMAL LOW (ref 101–111)
Creatinine, Ser: 0.89 mg/dL (ref 0.61–1.24)
GFR calc Af Amer: >60 mL/min (ref >60)
GFR calc non Af Amer: >60 mL/min (ref >60)
GLUCOSE: 109 mg/dL — AB (ref 65–99)
Potassium: 3.7 mmol/L (ref 3.5–5.1)
SODIUM: 115 mmol/L — AB (ref 135–145)
Total Bilirubin: 1.2 mg/dL (ref 0.3–1.2)
Total Protein: 7.7 g/dL (ref 6.5–8.1)

## 2015-06-19 LAB — CBC
HCT: 32.5 % — ABNORMAL LOW (ref 40.0–52.0)
HEMOGLOBIN: 11.4 g/dL — AB (ref 13.0–18.0)
MCH: 32.5 pg (ref 26.0–34.0)
MCHC: 35 g/dL (ref 32.0–36.0)
MCV: 92.8 fL (ref 80.0–100.0)
Platelets: 209 10*3/uL (ref 150–440)
RBC: 3.5 MIL/uL — ABNORMAL LOW (ref 4.40–5.90)
RDW: 17 % — AB (ref 11.5–14.5)
WBC: 5.5 10*3/uL (ref 3.8–10.6)

## 2015-06-19 LAB — SODIUM: SODIUM: 125 mmol/L — AB (ref 135–145)

## 2015-06-19 LAB — OSMOLALITY, URINE: OSMOLALITY UR: 73 mosm/kg — AB (ref 300–900)

## 2015-06-19 MED ORDER — METOPROLOL TARTRATE 25 MG PO TABS
25.0000 mg | ORAL_TABLET | Freq: Two times a day (BID) | ORAL | Status: DC
  Administered 2015-06-19 – 2015-06-20 (×3): 25 mg via ORAL
  Filled 2015-06-19 (×3): qty 1

## 2015-06-19 MED ORDER — LABETALOL HCL 5 MG/ML IV SOLN
10.0000 mg | Freq: Once | INTRAVENOUS | Status: AC
Start: 2015-06-19 — End: 2015-06-19
  Administered 2015-06-19: 10 mg via INTRAVENOUS
  Filled 2015-06-19: qty 4

## 2015-06-19 NOTE — Progress Notes (Signed)
[facility] Physicians - [place] at [facility]   PATIENT NAME: Luis [name]    MR#:  [id number]  DATE OF BIRTH:  08/23/1951  SUBJECTIVE:  Patient is sitting up eating breakfast and drinking quite well.  REVIEW OF SYSTEMS:    Review of Systems  Constitutional: Negative for fever, chills and malaise/fatigue.  HENT: Negative for sore throat.   Eyes: Negative for blurred vision.  Respiratory: Negative for cough, hemoptysis, shortness of breath and wheezing.   Cardiovascular: Negative for chest pain, palpitations and leg swelling.  Gastrointestinal: Negative for nausea, vomiting, abdominal pain, diarrhea and blood in stool.  Genitourinary: Negative for dysuria.  Musculoskeletal: Negative for back pain.  Neurological: Negative for dizziness, tremors and headaches.  Endo/Heme/Allergies: Does not bruise/bleed easily.    Tolerating Diet:yes      DRUG ALLERGIES:  No Known Allergies  VITALS:  Blood pressure 160/87, pulse 85, temperature 98.8 F (37.1 C), temperature source Oral, resp. rate 18, height 6' (1.829 m), weight 77.111 kg (170 lb), SpO2 97 %.  PHYSICAL EXAMINATION:   Physical Exam  Constitutional: He is oriented to person, place, and time and well-developed, well-nourished, and in no distress. No distress.  HENT:  Head: Normocephalic.  Eyes: No scleral icterus.  Neck: Normal range of motion. Neck supple. No JVD present. No tracheal deviation present.  Cardiovascular: Normal rate, regular rhythm and normal heart sounds.  Exam reveals no gallop and no friction rub.   No murmur heard. Pulmonary/Chest: Effort normal and breath sounds normal. No respiratory distress. He has no wheezes. He has no rales. He exhibits no tenderness.  Abdominal: Soft. Bowel sounds are normal. He exhibits no distension and no mass. There is no tenderness. There is no rebound and no guarding.  Musculoskeletal: Normal range of motion. He exhibits no edema.  Left lower rib pain   Neurological: He is alert and oriented to person, place, and time.  Skin: Skin is warm. No rash noted. No erythema.  Psychiatric: Affect and judgment normal.      LABORATORY PANEL:   CBC  Recent Labs Lab 06/19/15 0505  WBC 5.5  HGB 11.4*  HCT 32.5*  PLT 209   ------------------------------------------------------------------------------------------------------------------  Chemistries   Recent Labs Lab 06/18/15 0929  06/19/15 0505  NA 115*  < > 125*  K 3.7  --  4.2  CL 80*  --  93*  CO2 22  --  24  GLUCOSE 109*  --  93  BUN <5*  --  5*  CREATININE 0.89  --  0.78  CALCIUM 8.9  --  8.7*  AST 189*  --   --   ALT 117*  --   --   ALKPHOS 136*  --   --   BILITOT 1.2  --   --   < > = values in this interval not displayed. ------------------------------------------------------------------------------------------------------------------  Cardiac Enzymes  Recent Labs Lab 06/18/15 0929  TROPONINI <0.03   ------------------------------------------------------------------------------------------------------------------  RADIOLOGY:  Dg Ribs Unilateral Left  06/18/2015   ADDENDUM REPORT: 06/18/2015 12:46 ADDENDUM: Normal mediastinum and cardiac silhouette. Normal pulmonary vasculature. No evidence of effusion, infiltrate, or pneumothorax. Electronically Signed   By: Genevive Bi M.D.   On: 06/18/2015 12:46  06/18/2015   CLINICAL DATA:  Fall.  Rib pain.  EXAM: LEFT RIBS - 2 VIEW  COMPARISON:  None.  FINDINGS: There is a minimally displaced fracture of the anterior lateral seventh rib. Potential fracture the eighth rib additionally. No pneumothorax.  IMPRESSION: Minimally displaced fracture of  the anterior lateral seventh and eighth ribs on the LEFT.  Electronically Signed: By: Genevive Bi M.D. On: 06/18/2015 11:44     ASSESSMENT AND PLAN:    64 year old male with EtOH abuse who was brought in via EMS after a fall and found to have minimally displaced fracture  of the anterior seventh and eighth ribs on the left along with severe hyponatremia.  1. Severe hyponatremia: This is secondary to EtOH abuse with a component of dehydration. Patient's sodium is improving. I will check another sodium level at noon to assure that he is increasing.  2. Minimally displaced left seventh and eighth anterior Rib fractures: Continue when necessary pain meds.  3. GERD: Continue PPI  4. EtOH abuse: Patient is on CIWA protocol.   Patient needs PT and social work consult    Management plans discussed with the patient and he is in agreement.  CODE STATUS: full  TOTAL TIME TAKING CARE OF THIS PATIENT: 0 minutes.     POSSIBLE D/C tomorrow, DEPENDING ON CLINICAL CONDITION.   MODY, SITAL M.D on 06/19/2015 at 11:46 AM  Between 7am to 6pm - Pager - [phone] After 6pm go to [email or web address] - password [email or web address] [facility]  [place] Hospitalists  Office  [phone]  CC: Primary care physician; No PCP Per Patient

## 2015-06-19 NOTE — Plan of Care (Signed)
Problem: Discharge Progression Outcomes Goal: Other Discharge Outcomes/Goals Became hypertensive this am, MD notified, labetalol  IV once given per MD.

## 2015-06-19 NOTE — Care Management (Signed)
Admitted to [facility] with the diagnosis of hyponatremia. Daughter is Nash Dimmer [phone]) and she lives in [place]. Lives at [facility] x 2.5 years. Takes care of all basic activities of daily living himself, doesn't drive. No skilled facility. No home health. Uses no aids for ambulation. Uses local bus or [employer] for transportation. States he has been to the [facility] about 3 years ago. States he hasn'y seen a physician in 3 years. Will give physician's accepting new patient's list. Gwenette Greet RN MSN Care Management [phone]

## 2015-06-19 NOTE — Clinical Social Work Note (Signed)
Clinical Social Work Assessment  Patient Details  Name: Luis [name] MRN: [id number] Date of Birth: Sep 16, 1951  Date of referral:  06/12/15               Reason for consult:  Substance Use/ETOH Abuse                Permission sought to share information with:    Permission granted to share information::  No  Name::        Agency::     Relationship::     Contact Information:     Housing/Transportation Living arrangements for the past 2 months:  [place] of Information:  Patient Patient Interpreter Needed:  None Criminal Activity/Legal Involvement Pertinent to Current Situation/Hospitalization:  No - Comment as needed Significant Relationships:  Adult Children Lives with:  Self Do you feel safe going back to the place where you live?  Yes Need for family participation in patient care:  No (Coment)  Care giving concerns: Patient lives in [address], [place] in [place].    Social Worker assessment / plan: Holiday representative (CSW) received substance abuse consult. CSW met with patient alone at bedside. CSW introduced self and explained role of CSW department. Patient was alert and oriented and laying in the bed. Patient reported that he lives at [facility] alone. Per patient his social worker Luis [name] assists him at [employer]. Patient spoke fondly of his social worker Luis [name]. Patient reported that he has a wife that he has been separated from for 16 years but still offers him support. Patient reported that he has 2 children and 3 grandchildren. Per patient he drinks 8 to 10 beers a day. Patient stated that he has been drinking that much for about a month now. CSW provided substance abuse resources including [facility]. Patient was open to resources. Patient reported that he has plenty of food in the apartment and denied other needs. Please reconsult if future social work needs arise. CSW signing off.   Employment status:  Disabled  (Comment on whether or not currently receiving Disability), Retired Forensic scientist:  Medicare PT Recommendations:  No Follow Up Information / Referral to community resources:  Outpatient Substance Abuse Treatment Options  Patient/Family's Response to care: Patient is agreeable to following up on substance abuse resources.   Patient/Family's Understanding of and Emotional Response to Diagnosis, Current Treatment, and Prognosis: Patient was pleasant and thanked CSW for visit.   Emotional Assessment Appearance:  Appears stated age Attitude/Demeanor/Rapport:    Affect (typically observed):  Accepting, Adaptable, Pleasant Orientation:  Oriented to Self, Oriented to Place, Oriented to  Time, Oriented to Situation Alcohol / Substance use:  Alcohol Use Psych involvement (Current and /or in the community):  No (Comment)  Discharge Needs  Concerns to be addressed:  Discharge Planning Concerns Readmission within the last 30 days:  No Current discharge risk:  Substance Abuse Barriers to Discharge:  Continued Medical Work up   Loralyn Freshwater, LCSW 06/19/2015, 5:09 PM

## 2015-06-19 NOTE — Plan of Care (Addendum)
Problem: Discharge Progression Outcomes Goal: Other Discharge Outcomes/Goals Outcome: Progressing Plan of care progress to goal for: 1. Pain-pt c/o head ache, prn meds given with improvement 2. Hemodynamically-             -VSS, pt remains afebrile this shift             -IV fluids continue per orders 3. Complications-no evidence of this shift 4. Diet-pt is tolerating diet this shift 5. Activity-pt worked with PT and up to BR with assistance

## 2015-06-19 NOTE — Evaluation (Signed)
Physical Therapy Evaluation Patient Details Name: Luis [name] MRN: [id number] DOB: 01-26-1951 Today's Date: 06/19/2015   History of Present Illness  Pt is a 64 y.o. male presenting to hospital s/p fall after drinking and slipped on flip flop d/t rain and fell into the bushes.  Per notes, pt drinks 8-10 beer/day.  Imaging shows minimally displaced fx of anterior lateral 7th and 8th ribs L.  PMH:  GERD, EtOH.  Clinical Impression  Currently pt demonstrates impairments with balance and limitations with functional mobility related to balance.  Prior to admission, pt was independent without AD.  Pt lives alone at [facility].  Currently pt is CGA to min assist with ambulation (no AD) d/t occasional altered stepping pattern (pt usually able to self correct or steady himself with railing; pt also c/o hallway being too bright which may also complicate balance).  Pt would benefit from skilled PT to address above noted impairments and functional limitations although anticipate with continued mobility with staff pt's ambulation and balance with continue to improve.  Recommend pt discharge to home when medically appropriate.     Follow Up Recommendations No PT follow up    Equipment Recommendations       Recommendations for Other Services       Precautions / Restrictions Precautions Precautions: Fall Restrictions Weight Bearing Restrictions: No      Mobility  Bed Mobility Overal bed mobility: Independent             General bed mobility comments: supine to/from sit  Transfers Overall transfer level: Needs assistance Equipment used: None Transfers: Sit to/from Stand;Stand Pivot Transfers Sit to Stand: Min guard Stand pivot transfers: Min guard          Ambulation/Gait Ambulation/Gait assistance: Min guard;Min assist Ambulation Distance (Feet): 200 Feet Assistive device: None   Gait velocity: WNL   General Gait Details: occasional altered stepping  pattern (pt able to regain balance by himself or used railing in hallway to steady as needed on L side)  Stairs            Wheelchair Mobility    Modified Rankin (Stroke Patients Only)       Balance Overall balance assessment: Needs assistance Sitting-balance support: No upper extremity supported;Feet supported Sitting balance-Leahy Scale: Normal     Standing balance support: No upper extremity supported;During functional activity (washing face at sink) Standing balance-Leahy Scale: Good                               Pertinent Vitals/Pain Pain Assessment: No/denies pain  Vitals stable and WFL throughout treatment session.    Home Living Family/patient expects to be discharged to:: Independent living               Home Equipment: None Additional Comments: [employer] Living    Prior Function Level of Independence: Independent         Comments: Does not drive     Hand Dominance        Extremity/Trunk Assessment   Upper Extremity Assessment: Overall WFL for tasks assessed           Lower Extremity Assessment: Overall WFL for tasks assessed      Cervical / Trunk Assessment: Normal  Communication   Communication: No difficulties  Cognition Arousal/Alertness: Awake/alert Behavior During Therapy: WFL for tasks assessed/performed Overall Cognitive Status: Within Functional Limits for tasks assessed  General Comments   Nursing cleared pt for participation in physical therapy.  Pt agreeable to PT session.  Pt initially sleeping in bed with room darkened and requesting to wash his face prior to ambulating with PT.    Exercises        Assessment/Plan    PT Assessment Patient needs continued PT services  PT Diagnosis Difficulty walking   PT Problem List Decreased balance;Decreased mobility  PT Treatment Interventions DME instruction;Gait training;Functional mobility  training;Therapeutic activities;Therapeutic exercise;Balance training;Patient/family education   PT Goals (Current goals can be found in the Care Plan section) Acute Rehab PT Goals Patient Stated Goal: To go home PT Goal Formulation: With patient Time For Goal Achievement: 07/03/15 Potential to Achieve Goals: Good    Frequency Min 2X/week   Barriers to discharge        Co-evaluation               End of Session Equipment Utilized During Treatment: Gait belt Activity Tolerance: Patient tolerated treatment well Patient left: in bed;with call bell/phone within reach;with bed alarm set Nurse Communication: Mobility status         Time: 1610-9604 PT Time Calculation (min) (ACUTE ONLY): 19 min   Charges:   PT Evaluation $Initial PT Evaluation Tier I: 1 Procedure     PT G CodesHendricks Limes July 06, 2015, 3:12 PM Hendricks Limes, PT [phone]

## 2015-06-19 NOTE — Plan of Care (Signed)
Problem: Discharge Progression Outcomes Goal: Other Discharge Outcomes/Goals Outcome: Progressing Patient on CIWA started showing some signs of DT ativan given and symptoms improved, Patient's sodium also improved on last check.  Patient remains alert and oriented, cooperative and pleasant.  No other changes at this time.

## 2015-06-20 LAB — CBC
HCT: 32.2 % — ABNORMAL LOW (ref 40.0–52.0)
Hemoglobin: 10.9 g/dL — ABNORMAL LOW (ref 13.0–18.0)
MCH: 31.7 pg (ref 26.0–34.0)
MCHC: 33.7 g/dL (ref 32.0–36.0)
MCV: 94.1 fL (ref 80.0–100.0)
PLATELETS: 210 10*3/uL (ref 150–440)
RBC: 3.43 MIL/uL — AB (ref 4.40–5.90)
RDW: 16.7 % — AB (ref 11.5–14.5)
WBC: 4.3 10*3/uL (ref 3.8–10.6)

## 2015-06-20 LAB — BASIC METABOLIC PANEL
Anion gap: 6 (ref 5–15)
CALCIUM: 8.8 mg/dL — AB (ref 8.9–10.3)
CHLORIDE: 97 mmol/L — AB (ref 101–111)
CO2: 25 mmol/L (ref 22–32)
CREATININE: 0.85 mg/dL (ref 0.61–1.24)
GFR calc non Af Amer: >60 mL/min (ref >60)
Glucose, Bld: 94 mg/dL (ref 65–99)
Potassium: 3.8 mmol/L (ref 3.5–5.1)
Sodium: 128 mmol/L — ABNORMAL LOW (ref 135–145)

## 2015-06-20 LAB — MAGNESIUM: MAGNESIUM: 1.8 mg/dL (ref 1.7–2.4)

## 2015-06-20 MED ORDER — METOPROLOL TARTRATE 25 MG PO TABS: 25.0000 mg | ORAL_TABLET | Freq: Two times a day (BID) | ORAL | Status: DC

## 2015-06-20 MED ORDER — THIAMINE HCL 100 MG PO TABS: 100.0000 mg | ORAL_TABLET | Freq: Every day | ORAL | Status: DC

## 2015-06-20 MED ORDER — FOLIC ACID 1 MG PO TABS: 1.0000 mg | ORAL_TABLET | Freq: Every day | ORAL | Status: DC

## 2015-06-20 NOTE — Discharge Instructions (Signed)
Heart healthy diet. Activity as tolerated. Follow up BMP with PCP Smoking cessation. Outpatient alcohol detox.

## 2015-06-20 NOTE — Progress Notes (Signed)
Discussed discharge instructions and medications with pt.  No questions at this time.  IV removed per policy. Pt transported home via car by his ex-wife.  Orson Ape, RN

## 2015-06-20 NOTE — Care Management Note (Signed)
Case Management Note  Patient Details  Name: Kellen Dutch MRN: [id number] Date of Birth: [birth year]  Subjective/Objective:     Discharged back to [facility]. Has a Child psychotherapist on site there. Provided with resources for substance abuse assistance by [facility] social worker, and was given a [employer] physicians accepting new patients by Terrilee Croak, CM last week. No home health orders.                Action/Plan:   Expected Discharge Date:                  Expected Discharge Plan:     In-House Referral:     Discharge planning Services     Post Acute Care Choice:    Choice offered to:     DME Arranged:    DME Agency:     HH Arranged:    HH Agency:     Status of Service:     Medicare Important Message Given:    Date Medicare IM Given:    Medicare IM give by:    Date Additional Medicare IM Given:    Additional Medicare Important Message give by:     If discussed at Long Length of Stay Meetings, dates discussed:    Additional Comments:  Rockett,Marilyn A, RN 06/20/2015, 9:34 AM

## 2015-06-20 NOTE — Discharge Summary (Signed)
[facility] [place] at [facility]   PATIENT NAME: Luis [name]    MR#:  [id number]  DATE OF BIRTH:  1950/10/07  DATE OF ADMISSION:  06/18/2015 ADMITTING PHYSICIAN: Alford Highland, MD  DATE OF DISCHARGE: 06/20/2015 11:52 AM  PRIMARY CARE PHYSICIAN: No PCP Per Patient    ADMISSION DIAGNOSIS:  Alcohol abuse [F10.10] Hyponatremia [E87.1] Rib pain on left side [R07.81]   DISCHARGE DIAGNOSIS:  Hyponatremia  SECONDARY DIAGNOSIS:   Past Medical History  Diagnosis Date  . GERD (gastroesophageal reflux disease)   . Alcohol abuse     HOSPITAL COURSE:   1. Severe hyponatremia: This is secondary to EtOH abuse with a component of dehydration. Patient's sodium has been improving. Sodium increased from 115 to 128. The patient has no complaints.  2. Minimally displaced left seventh and eighth anterior Rib fractures: Continue when necessary pain meds.  3. GERD: Continue PPI  4. EtOH abuse: Patient is on CIWA protocol. No signs of withdrawal. He has been treated with folic acid and thiamine.  DISCHARGE CONDITIONS:   Stable, discharged home today.  CONSULTS OBTAINED:  Treatment Team:  Shaune Pollack, MD  DRUG ALLERGIES:  No Known Allergies  DISCHARGE MEDICATIONS:   Discharge Medication List as of 06/20/2015 10:23 AM    START taking these medications   Details  folic acid (FOLVITE) 1 MG tablet Take 1 tablet (1 mg total) by mouth daily., Starting 06/20/2015, Until Discontinued, Print    metoprolol tartrate (LOPRESSOR) 25 MG tablet Take 1 tablet (25 mg total) by mouth 2 (two) times daily., Starting 06/20/2015, Until Discontinued, Print    thiamine 100 MG tablet Take 1 tablet (100 mg total) by mouth daily., Starting 06/20/2015, Until Discontinued, Print      CONTINUE these medications which have NOT CHANGED   Details  esomeprazole (NEXIUM) 20 MG capsule Take 20 mg by mouth daily at 12 noon., Until Discontinued, Historical Med         DISCHARGE  INSTRUCTIONS:    If you experience worsening of your admission symptoms, develop shortness of breath, life threatening emergency, suicidal or homicidal thoughts you must seek medical attention immediately by calling 911 or calling your MD immediately  if symptoms less severe.  You Must read complete instructions/literature along with all the possible adverse reactions/side effects for all the Medicines you take and that have been prescribed to you. Take any new Medicines after you have completely understood and accept all the possible adverse reactions/side effects.   Please note  You were cared for by a hospitalist during your hospital stay. If you have any questions about your discharge medications or the care you received while you were in the hospital after you are discharged, you can call the unit and asked to speak with the hospitalist on call if the hospitalist that took care of you is not available. Once you are discharged, your primary care physician will handle any further medical issues. Please note that NO REFILLS for any discharge medications will be authorized once you are discharged, as it is imperative that you return to your primary care physician (or establish a relationship with a primary care physician if you do not have one) for your aftercare needs so that they can reassess your need for medications and monitor your lab values.    Today   SUBJECTIVE   No complaint   VITAL SIGNS:  Blood pressure 168/93, pulse 72, temperature 97.4 F (36.3 C), temperature source Oral, resp. rate 18, height 6' (  1.829 m), weight 77.111 kg (170 lb), SpO2 99 %.  I/O:   Intake/Output Summary (Last 24 hours) at 06/20/15 1539 Last data filed at 06/20/15 0900  Gross per 24 hour  Intake    240 ml  Output    250 ml  Net    -10 ml    PHYSICAL EXAMINATION:  GENERAL:  64 y.o.-year-old patient lying in the bed with no acute distress.  EYES: Pupils equal, round, reactive to light and  accommodation. No scleral icterus. Extraocular muscles intact.  HEENT: Head atraumatic, normocephalic. Oropharynx and nasopharynx clear. Moist oral mucosa. NECK:  Supple, no jugular venous distention. No thyroid enlargement, no tenderness.  LUNGS: Normal breath sounds bilaterally, no wheezing, rales,rhonchi or crepitation. No use of accessory muscles of respiration.  CARDIOVASCULAR: S1, S2 normal. No murmurs, rubs, or gallops.  ABDOMEN: Soft, non-tender, non-distended. Bowel sounds present. No organomegaly or mass.  EXTREMITIES: No pedal edema, cyanosis, or clubbing.  NEUROLOGIC: Cranial nerves II through XII are intact. Muscle strength 5/5 in all extremities. Sensation intact. Gait not checked.  PSYCHIATRIC: The patient is alert and oriented x 3.  SKIN: No obvious rash, lesion, or ulcer.   DATA REVIEW:   CBC  Recent Labs Lab 06/20/15 0542  WBC 4.3  HGB 10.9*  HCT 32.2*  PLT 210    Chemistries   Recent Labs Lab 06/18/15 0929  06/20/15 0542  NA 115*  < > 128*  K 3.7  < > 3.8  CL 80*  < > 97*  CO2 22  < > 25  GLUCOSE 109*  < > 94  BUN <5*  < > <5*  CREATININE 0.89  < > 0.85  CALCIUM 8.9  < > 8.8*  MG  --   --  1.8  AST 189*  --   --   ALT 117*  --   --   ALKPHOS 136*  --   --   BILITOT 1.2  --   --   < > = values in this interval not displayed.  Cardiac Enzymes  Recent Labs Lab 06/18/15 0929  TROPONINI <0.03    Microbiology Results  Results for orders placed or performed in visit on 10/27/12  Culture, blood (single)     Status: None   Collection Time: 10/27/12  2:12 PM  Result Value Ref Range Status   Micro Text Report   Final       COMMENT                   NO GROWTH AEROBICALLY/ANAEROBICALLY IN 5 DAYS   ANTIBIOTIC                                                        RADIOLOGY:  No results found.      Management plans discussed with the patient, family and they are in agreement.  CODE STATUS:   TOTAL TIME TAKING CARE OF THIS PATIENT: 35   minutes.    Shaune Pollack M.D on 06/20/2015 at 3:39 PM  Between 7am to 6pm - Pager - [phone]  After 6pm go to [email or web address] - password [email or web address] [facility]  [facility]  Office  [phone]  CC: Primary care physician; No PCP Per Patient

## 2015-07-21 ENCOUNTER — Encounter: Payer: Self-pay | Admitting: Emergency Medicine

## 2015-07-21 ENCOUNTER — Emergency Department: Payer: Medicare Other

## 2015-07-21 ENCOUNTER — Emergency Department
Admission: EM | Admit: 2015-07-21 | Discharge: 2015-07-21 | Disposition: A | Discharge status: Home / Self Care | Payer: Medicare Other | Attending: Student | Admitting: Student

## 2015-07-21 DIAGNOSIS — Z720 Tobacco use: Secondary | ICD-10-CM | POA: Diagnosis not present

## 2015-07-21 DIAGNOSIS — M79672 Pain in left foot: Secondary | ICD-10-CM | POA: Diagnosis present

## 2015-07-21 DIAGNOSIS — Z79899 Other long term (current) drug therapy: Secondary | ICD-10-CM | POA: Insufficient documentation

## 2015-07-21 DIAGNOSIS — M25572 Pain in left ankle and joints of left foot: Secondary | ICD-10-CM

## 2015-07-21 DIAGNOSIS — R600 Localized edema: Secondary | ICD-10-CM

## 2015-07-21 DIAGNOSIS — L03116 Cellulitis of left lower limb: Secondary | ICD-10-CM | POA: Diagnosis not present

## 2015-07-21 DIAGNOSIS — R51 Headache: Secondary | ICD-10-CM | POA: Diagnosis not present

## 2015-07-21 LAB — BASIC METABOLIC PANEL
Anion gap: 12 (ref 5–15)
BUN: 6 mg/dL (ref 6–20)
CALCIUM: 9.2 mg/dL (ref 8.9–10.3)
CO2: 24 mmol/L (ref 22–32)
CREATININE: 0.88 mg/dL (ref 0.61–1.24)
Chloride: 94 mmol/L — ABNORMAL LOW (ref 101–111)
GFR calc Af Amer: >60 mL/min (ref >60)
GLUCOSE: 100 mg/dL — AB (ref 65–99)
POTASSIUM: 4.5 mmol/L (ref 3.5–5.1)
SODIUM: 130 mmol/L — AB (ref 135–145)

## 2015-07-21 LAB — CBC
HCT: 36.3 % — ABNORMAL LOW (ref 40.0–52.0)
Hemoglobin: 12.2 g/dL — ABNORMAL LOW (ref 13.0–18.0)
MCH: 32.3 pg (ref 26.0–34.0)
MCHC: 33.6 g/dL (ref 32.0–36.0)
MCV: 96.1 fL (ref 80.0–100.0)
PLATELETS: 555 10*3/uL — AB (ref 150–440)
RBC: 3.78 MIL/uL — AB (ref 4.40–5.90)
RDW: 15.3 % — AB (ref 11.5–14.5)
WBC: 8.9 10*3/uL (ref 3.8–10.6)

## 2015-07-21 LAB — URIC ACID: URIC ACID, SERUM: 5.5 mg/dL (ref 4.4–7.6)

## 2015-07-21 MED ORDER — SULFAMETHOXAZOLE-TRIMETHOPRIM 800-160 MG PO TABS: 1.0000 | ORAL_TABLET | Freq: Two times a day (BID) | ORAL | Status: DC

## 2015-07-21 MED ORDER — TRAMADOL HCL 50 MG PO TABS: 50.0000 mg | ORAL_TABLET | Freq: Four times a day (QID) | ORAL | Status: DC | PRN

## 2015-07-21 MED ORDER — IBUPROFEN 800 MG PO TABS: 800.0000 mg | ORAL_TABLET | Freq: Three times a day (TID) | ORAL | Status: DC | PRN

## 2015-07-21 MED ORDER — TRAMADOL HCL 50 MG PO TABS
50.0000 mg | ORAL_TABLET | Freq: Once | ORAL | Status: AC
  Administered 2015-07-21: 50 mg via ORAL
  Filled 2015-07-21: qty 1

## 2015-07-21 MED ORDER — COLCHICINE 0.6 MG PO TABS: 0.6000 mg | ORAL_TABLET | Freq: Every day | ORAL | Status: DC

## 2015-07-21 MED ORDER — KETOROLAC TROMETHAMINE 60 MG/2ML IM SOLN
60.0000 mg | Freq: Once | INTRAMUSCULAR | Status: AC
  Administered 2015-07-21: 60 mg via INTRAMUSCULAR
  Filled 2015-07-21: qty 2

## 2015-07-21 NOTE — Discharge Instructions (Signed)
Cellulitis °Cellulitis is an infection of the skin and the tissue under the skin. The infected area is usually red and tender. This happens most often in the arms and lower legs. °HOME CARE  °· Take your antibiotic medicine as told. Finish the medicine even if you start to feel better. °· Keep the infected arm or leg raised (elevated). °· Put a warm cloth on the area up to 4 times per day. °· Only take medicines as told by your doctor. °· Keep all doctor visits as told. °GET HELP IF: °· You see red streaks on the skin coming from the infected area. °· Your red area gets bigger or turns a dark color. °· Your bone or joint under the infected area is painful after the skin heals. °· Your infection comes back in the same area or different area. °· You have a puffy (swollen) bump in the infected area. °· You have new symptoms. °· You have a fever. °GET HELP RIGHT AWAY IF:  °· You feel very sleepy. °· You throw up (vomit) or have watery poop (diarrhea). °· You feel sick and have muscle aches and pains. °  °This information is not intended to replace advice given to you by your health care provider. Make sure you discuss any questions you have with your health care provider. °  °Document Released: 02/29/2008 Document Revised: 06/03/2015 Document Reviewed: 11/28/2011 °Elsevier Interactive Patient Education ©2016 Elsevier Inc. ° °

## 2015-07-21 NOTE — ED Provider Notes (Signed)
[facility] Emergency Department Provider Note  ____________________________________________  Time seen: Approximately 12:37 PM  I have reviewed the triage vital signs and the nursing notes.   HISTORY  Chief Complaint Foot Pain    HPI Luis [name] is a 64 y.o. male who presents with left foot pain for one week. Last week he was in the [place] hospital in [place] and was wearing compression hose; Monday night he took off the hose to sleep. Tuesday morning the ankle was swollen and felt like a sprain, though he recalls no trauma during the night. He was discharged home Tuesday. Wednesday morning he could barely move the ankle. Sunday he noticed small color change on the medial ankle; it has since spread up the calf. The pain is now at a 10/10. He reports chills, and that the foot feels very hot, with occasional numbness. Walking and sitting with the foot dangling increases the pain. Elevation, rubbing alcohol helps. Ibuprofen for a concurrent tension HA has not helped. He wonders if it is a spider bite; but he saw no insect or spider.  He also reports he has had gout in the MTP joint before. He drinks 10-12 beers a day. He denies history of heart problems.     Past Medical History  Diagnosis Date  . GERD (gastroesophageal reflux disease)   . Alcohol abuse     Patient Active Problem List   Diagnosis Date Noted  . Hyponatremia 06/18/2015    Past Surgical History  Procedure Laterality Date  . Cholecystectomy      Current Outpatient Rx  Name  Route  Sig  Dispense  Refill  . esomeprazole (NEXIUM) 20 MG capsule   Oral   Take 20 mg by mouth daily at 12 noon.         . folic acid (FOLVITE) 1 MG tablet   Oral   Take 1 tablet (1 mg total) by mouth daily.   30 tablet   0   . metoprolol tartrate (LOPRESSOR) 25 MG tablet   Oral   Take 1 tablet (25 mg total) by mouth 2 (two) times daily.   60 tablet   0   . thiamine 100 MG tablet   Oral   Take 1  tablet (100 mg total) by mouth daily.   14 tablet   0     Allergies Review of patient's allergies indicates no known allergies.  Family History  Problem Relation Age of Onset  . Alcohol abuse Mother   . Alcohol abuse Father     Social History Social History  Substance Use Topics  . Smoking status: Current Every Day Smoker -- 0.50 packs/day  . Smokeless tobacco: None  . Alcohol Use: 36.0 oz/week    60 Cans of beer per week    Review of Systems Constitutional: Positive for chills.  ENT: No sore throat. No ear or nose drainage. Cardiovascular: Denies chest pain. Respiratory: Denies shortness of breath. Gastrointestinal: No abdominal pain.  No nausea, no vomiting.  No diarrhea.  No constipation. Genitourinary: Negative for dysuria. Musculoskeletal: Negative for back pain. Positive for pain in left foot. Skin: Positive for reddish-purple skin change from ankle spreading up medial side of leg. Negative for red streaking. Neurological: Positive for headache; positive for numbness in left foot.  Negative for focal weakness.  10-point ROS otherwise negative.  ____________________________________________   PHYSICAL EXAM:  VITAL SIGNS: ED Triage Vitals  Enc Vitals Group     BP 07/21/15 1030 170/83 mmHg  Pulse Rate 07/21/15 1030 107     Resp 07/21/15 1030 20     Temp 07/21/15 1030 97.8 F (36.6 C)     Temp Source 07/21/15 1030 Oral     SpO2 07/21/15 1030 96 %     Weight 07/21/15 1030 170 lb (77.111 kg)     Height 07/21/15 1030 6' (1.829 m)     Head Cir --      Peak Flow --      Pain Score 07/21/15 1031 10     Pain Loc --      Pain Edu? --      Excl. in GC? --     Constitutional: Alert and oriented. In no acute distress. Cooperative. Head: Atraumatic. Nose: No congestion/rhinnorhea. Mouth/Throat: Mucous membranes are moist.  Oropharynx non-erythematous. Neck: No stridor.   Cardiovascular: Normal rate, regular rhythm. Grossly normal heart sounds.  Good  peripheral circulation.  Respiratory: Normal respiratory effort.  No retractions. Lungs CTAB. Gastrointestinal: Soft and nontender. No distention.  Musculoskeletal: Left foot, ankle, and calf are swollen, no pitting edema; limited ROM.  Strength in hips, knees, ankles 5/5 bilaterally. MCP joints mildly tender to palpation. Neurologic:  Normal speech and language. No gross focal neurologic deficits are appreciated. No gait instability. Skin:  Skin is warm, dry and intact. Left foot petechial rash that does not blanch extending from medial ankle to mid-calf. Psychiatric: Mood and affect are normal. Speech and behavior are normal.  ____________________________________________   LABS (all labs ordered are listed, but only abnormal results are displayed)  Labs Reviewed  CBC - Abnormal; Notable for the following:    RBC 3.78 (*)    Hemoglobin 12.2 (*)    HCT 36.3 (*)    RDW 15.3 (*)    Platelets 555 (*)    All other components within normal limits  BASIC METABOLIC PANEL - Abnormal; Notable for the following:    Sodium 130 (*)    Chloride 94 (*)    Glucose, Bld 100 (*)    All other components within normal limits  URIC ACID   ____________________________________________  EKG  None ____________________________________________  RADIOLOGY  Ultrasound was negative for DVT ____________________________________________   PROCEDURES  Procedure(s) performed: None  Critical Care performed: No  ____________________________________________   INITIAL IMPRESSION / ASSESSMENT AND PLAN / ED COURSE  Pertinent labs & imaging results that were available during my care of the patient were reviewed by me and considered in my medical decision making (see chart for details).  Cellulitis right lower extremity. Discussed labs and ultrasound results with patient. Patient be discharged prescription for Bactrim DS, tramadol, ibuprofen, and colchicine . Patient advised to [facility] with [facility] for [facility] care. ____________________________________________   FINAL CLINICAL IMPRESSION(S) / ED DIAGNOSES  Final diagnoses:  Cellulitis of left foot     Joni ReiningRonald K Smith, PA-C 07/21/15 1348  Gayla DossEryka A Gayle, MD 07/21/15 [id number]

## 2015-07-21 NOTE — ED Notes (Signed)
States possible spider bite to left ankle   Left ankle red and swollen

## 2015-07-21 NOTE — ED Notes (Signed)
Discussed discharge instructions, prescriptions, and follow-up care with patient. No questions or concerns at this time. Pt stable at discharge. Pt called friend to pick him up.

## 2015-07-21 NOTE — ED Notes (Signed)
Pt to ed with c/o left foot and lower leg pain and swelling x 1 week.  Pt states he may have been bit by a spider.

## 2015-12-03 ENCOUNTER — Inpatient Hospital Stay
Admission: EM | Admit: 2015-12-03 | Discharge: 2015-12-09 | DRG: 896 | Disposition: A | Discharge status: Home / Self Care | Payer: Medicare HMO | Attending: Internal Medicine | Admitting: Internal Medicine

## 2015-12-03 DIAGNOSIS — Z23 Encounter for immunization: Secondary | ICD-10-CM | POA: Diagnosis not present

## 2015-12-03 DIAGNOSIS — Z811 Family history of alcohol abuse and dependence: Secondary | ICD-10-CM

## 2015-12-03 DIAGNOSIS — F1010 Alcohol abuse, uncomplicated: Secondary | ICD-10-CM

## 2015-12-03 DIAGNOSIS — D649 Anemia, unspecified: Secondary | ICD-10-CM | POA: Diagnosis present

## 2015-12-03 DIAGNOSIS — G9341 Metabolic encephalopathy: Secondary | ICD-10-CM | POA: Diagnosis present

## 2015-12-03 DIAGNOSIS — E440 Moderate protein-calorie malnutrition: Secondary | ICD-10-CM | POA: Diagnosis present

## 2015-12-03 DIAGNOSIS — R296 Repeated falls: Secondary | ICD-10-CM | POA: Diagnosis present

## 2015-12-03 DIAGNOSIS — F10231 Alcohol dependence with withdrawal delirium: Secondary | ICD-10-CM | POA: Diagnosis present

## 2015-12-03 DIAGNOSIS — I10 Essential (primary) hypertension: Secondary | ICD-10-CM | POA: Diagnosis present

## 2015-12-03 DIAGNOSIS — J449 Chronic obstructive pulmonary disease, unspecified: Secondary | ICD-10-CM | POA: Diagnosis present

## 2015-12-03 DIAGNOSIS — F17210 Nicotine dependence, cigarettes, uncomplicated: Secondary | ICD-10-CM | POA: Diagnosis present

## 2015-12-03 DIAGNOSIS — E876 Hypokalemia: Secondary | ICD-10-CM | POA: Diagnosis present

## 2015-12-03 DIAGNOSIS — E871 Hypo-osmolality and hyponatremia: Secondary | ICD-10-CM | POA: Diagnosis present

## 2015-12-03 DIAGNOSIS — F10931 Alcohol use, unspecified with withdrawal delirium: Secondary | ICD-10-CM

## 2015-12-03 LAB — COMPREHENSIVE METABOLIC PANEL
ALT: 79 U/L — ABNORMAL HIGH (ref 17–63)
ANION GAP: 12 (ref 5–15)
AST: 110 U/L — AB (ref 15–41)
Albumin: 3.8 g/dL (ref 3.5–5.0)
Alkaline Phosphatase: 227 U/L — ABNORMAL HIGH (ref 38–126)
BUN: 18 mg/dL (ref 6–20)
CHLORIDE: 80 mmol/L — AB (ref 101–111)
CO2: 21 mmol/L — ABNORMAL LOW (ref 22–32)
Calcium: 9 mg/dL (ref 8.9–10.3)
Creatinine, Ser: 1.19 mg/dL (ref 0.61–1.24)
GFR calc Af Amer: >60 mL/min (ref >60)
Glucose, Bld: 87 mg/dL (ref 65–99)
POTASSIUM: 4.1 mmol/L (ref 3.5–5.1)
Sodium: 113 mmol/L — CL (ref 135–145)
Total Bilirubin: 1.3 mg/dL — ABNORMAL HIGH (ref 0.3–1.2)
Total Protein: 7.4 g/dL (ref 6.5–8.1)

## 2015-12-03 LAB — URINE DRUG SCREEN, QUALITATIVE (ARMC ONLY)
AMPHETAMINES, UR SCREEN: NOT DETECTED
Barbiturates, Ur Screen: NOT DETECTED
Benzodiazepine, Ur Scrn: NOT DETECTED
COCAINE METABOLITE, UR ~~LOC~~: NOT DETECTED
Cannabinoid 50 Ng, Ur ~~LOC~~: NOT DETECTED
MDMA (ECSTASY) UR SCREEN: NOT DETECTED
METHADONE SCREEN, URINE: NOT DETECTED
OPIATE, UR SCREEN: NOT DETECTED
Phencyclidine (PCP) Ur S: NOT DETECTED
TRICYCLIC, UR SCREEN: NOT DETECTED

## 2015-12-03 LAB — CBC
HEMATOCRIT: 32.8 % — AB (ref 40.0–52.0)
HEMOGLOBIN: 11.6 g/dL — AB (ref 13.0–18.0)
MCH: 31.8 pg (ref 26.0–34.0)
MCHC: 35.3 g/dL (ref 32.0–36.0)
MCV: 90.2 fL (ref 80.0–100.0)
Platelets: 209 10*3/uL (ref 150–440)
RBC: 3.64 MIL/uL — AB (ref 4.40–5.90)
RDW: 14.9 % — ABNORMAL HIGH (ref 11.5–14.5)
WBC: 11 10*3/uL — AB (ref 3.8–10.6)

## 2015-12-03 LAB — SODIUM, URINE, RANDOM

## 2015-12-03 LAB — OSMOLALITY, URINE: Osmolality, Ur: 207 mOsm/kg — ABNORMAL LOW (ref 300–900)

## 2015-12-03 LAB — ETHANOL

## 2015-12-03 MED ORDER — FOLIC ACID 1 MG PO TABS: 1.0000 mg | ORAL_TABLET | Freq: Every day | ORAL | Status: DC

## 2015-12-03 MED ORDER — ONDANSETRON HCL 4 MG/2ML IJ SOLN: 4.0000 mg | Freq: Four times a day (QID) | INTRAMUSCULAR | Status: DC | PRN

## 2015-12-03 MED ORDER — THIAMINE HCL 100 MG/ML IJ SOLN
100.0000 mg | Freq: Once | INTRAMUSCULAR | Status: AC
  Administered 2015-12-03: 100 mg via INTRAVENOUS
  Filled 2015-12-03: qty 2

## 2015-12-03 MED ORDER — SODIUM CHLORIDE 0.9 % IV SOLN
INTRAVENOUS | Status: DC
  Administered 2015-12-03 – 2015-12-07 (×7): via INTRAVENOUS

## 2015-12-03 MED ORDER — COLCHICINE 0.6 MG PO TABS
0.6000 mg | ORAL_TABLET | Freq: Every day | ORAL | Status: DC
  Administered 2015-12-03 – 2015-12-08 (×6): 0.6 mg via ORAL
  Filled 2015-12-03 (×6): qty 1

## 2015-12-03 MED ORDER — IPRATROPIUM-ALBUTEROL 0.5-2.5 (3) MG/3ML IN SOLN: 3.0000 mL | Freq: Four times a day (QID) | RESPIRATORY_TRACT | Status: DC | PRN

## 2015-12-03 MED ORDER — PANTOPRAZOLE SODIUM 40 MG IV SOLR: 40.0000 mg | INTRAVENOUS | Status: DC

## 2015-12-03 MED ORDER — ONDANSETRON HCL 4 MG PO TABS: 4.0000 mg | ORAL_TABLET | Freq: Four times a day (QID) | ORAL | Status: DC | PRN

## 2015-12-03 MED ORDER — TIOTROPIUM BROMIDE MONOHYDRATE 18 MCG IN CAPS
18.0000 ug | ORAL_CAPSULE | Freq: Every day | RESPIRATORY_TRACT | Status: DC
  Administered 2015-12-04 – 2015-12-09 (×6): 18 ug via RESPIRATORY_TRACT
  Filled 2015-12-03 (×2): qty 5

## 2015-12-03 MED ORDER — METOPROLOL TARTRATE 25 MG PO TABS
25.0000 mg | ORAL_TABLET | Freq: Two times a day (BID) | ORAL | Status: DC
  Administered 2015-12-03 – 2015-12-09 (×13): 25 mg via ORAL
  Filled 2015-12-03 (×13): qty 1

## 2015-12-03 MED ORDER — ALUM & MAG HYDROXIDE-SIMETH 200-200-20 MG/5ML PO SUSP
30.0000 mL | Freq: Four times a day (QID) | ORAL | Status: DC | PRN
  Administered 2015-12-04: 30 mL via ORAL
  Filled 2015-12-03: qty 30

## 2015-12-03 MED ORDER — VITAMIN B-1 100 MG PO TABS
100.0000 mg | ORAL_TABLET | Freq: Every day | ORAL | Status: DC
  Administered 2015-12-04: 100 mg via ORAL
  Filled 2015-12-03: qty 1

## 2015-12-03 MED ORDER — PNEUMOCOCCAL VAC POLYVALENT 25 MCG/0.5ML IJ INJ
0.5000 mL | INJECTION | INTRAMUSCULAR | Status: AC
  Administered 2015-12-04: 0.5 mL via INTRAMUSCULAR
  Filled 2015-12-03: qty 0.5

## 2015-12-03 MED ORDER — FOLIC ACID 5 MG/ML IJ SOLN
1.0000 mg | Freq: Once | INTRAMUSCULAR | Status: AC
  Administered 2015-12-03: 1 mg via INTRAVENOUS
  Filled 2015-12-03: qty 0.2

## 2015-12-03 MED ORDER — ENOXAPARIN SODIUM 40 MG/0.4ML ~~LOC~~ SOLN
40.0000 mg | SUBCUTANEOUS | Status: DC
  Administered 2015-12-03 – 2015-12-05 (×3): 40 mg via SUBCUTANEOUS
  Filled 2015-12-03 (×4): qty 0.4

## 2015-12-03 MED ORDER — SODIUM CHLORIDE 0.9 % IV BOLUS (SEPSIS)
1000.0000 mL | Freq: Once | INTRAVENOUS | Status: AC
  Administered 2015-12-03: 1000 mL via INTRAVENOUS

## 2015-12-03 NOTE — Progress Notes (Signed)
°   12/03/15 1800  Clinical Encounter Type  Visited With Patient  Visit Type Initial  Referral From Nurse  Consult/Referral To Chaplain  Pastoral care visit. Chaplain [employer]elyn Crews Ext [phone]

## 2015-12-03 NOTE — ED Notes (Signed)
Pt arrives via [facility] from [facility] where he lives  Pt reports weakness that has gotten worse over the last three days  Pt reports that he drinks 12- 18 beers everyday but has not had any in two days  Staff at [facility] reports that his gait has been unsteady and we must find out if something is wrong or make him stop drinking or he will not be able to live there anymore  EMS - BP  138/88  Pt reports that he has not had his BP meds in one month  P108 97% RA

## 2015-12-03 NOTE — ED Notes (Signed)
[facility]  Pt to transfer to room 143

## 2015-12-03 NOTE — ED Notes (Addendum)

## 2015-12-03 NOTE — ED Notes (Signed)
BEHAVIORAL HEALTH ROUNDING Patient sleeping: Yes.   Patient alert and oriented: eyes closed  Appears asleep Behavior appropriate: Yes.  ; If no, describe:  Nutrition and fluids offered: Yes  Toileting and hygiene offered: sleeping Sitter present: q 15 minute observations and security monitoring Law enforcement present: yes  ODS 

## 2015-12-03 NOTE — ED Notes (Signed)
Lunch provided  Pt observed lying in hallway bed  NAD  Continue to monitor

## 2015-12-03 NOTE — Progress Notes (Signed)
Spoke to Dr Luberta MutterKonidena regarding patients NA level being 113 and being on a med/surg floor. She acknowledged my concerns no change in placement at this time.

## 2015-12-03 NOTE — Care Management (Addendum)
RNCM consult received for Rx assistance and will follow up with patient. [facility]. Per September 2016 visit RNCM note: [facility] with the diagnosis of hyponatremia. Daughter is Nash DimmerKerry [phone]) and [place]. Lives [facility] 2.5 years. Takes care of all basic activities of daily living himself, doesn't drive. No skilled facility. No home health. Uses no aids for ambulation. Uses local bus or [employer] for transportation. States he has been to the [facility] about 3 years ago. States he hasn't seen a physician in 3 years. Will give physician's accepting new patient's list. [facility] is for patient's without insurance. Per CSW note resources for alcohol treatment also delivered. I will meet with patient tomorrow. Thanks for the consult.

## 2015-12-03 NOTE — ED Notes (Signed)
BEHAVIORAL HEALTH ROUNDING Patient sleeping: No. Patient alert and oriented: yes Behavior appropriate: Yes.  ; If no, describe:  Nutrition and fluids offered: yes Toileting and hygiene offered: Yes  Sitter present: q15 minute observations and security  monitoring Law enforcement present: Yes  ODS  

## 2015-12-03 NOTE — ED Provider Notes (Signed)
[facility] Emergency Department Provider Note  ____________________________________________  Time seen: Approximately 1 PM  I have reviewed the triage vital signs and the nursing notes.   HISTORY  Chief Complaint Alcohol Intoxication and Weakness    HPI Luis [name] is a 65 y.o. male with a history of alcohol abuse who is presenting today with weakness over the past month. He says that he normally drinks 12-18 beers per day but has not had anything in 2 days. He denies any other drugs. The staff at [facility] reporting that he is also having unsteady gait.The patient denies any pain at this time. Denies any focal weakness.   Past Medical History  Diagnosis Date  . GERD (gastroesophageal reflux disease)   . Alcohol abuse     Patient Active Problem List   Diagnosis Date Noted  . Hyponatremia 06/18/2015    Past Surgical History  Procedure Laterality Date  . Cholecystectomy      Current Outpatient Rx  Name  Route  Sig  Dispense  Refill  . colchicine 0.6 MG tablet   Oral   Take 1 tablet (0.6 mg total) by mouth daily.   30 tablet   0   . esomeprazole (NEXIUM) 20 MG capsule   Oral   Take 20 mg by mouth daily at 12 noon.         . folic acid (FOLVITE) 1 MG tablet   Oral   Take 1 tablet (1 mg total) by mouth daily.   30 tablet   0   . ibuprofen (ADVIL,MOTRIN) 800 MG tablet   Oral   Take 1 tablet (800 mg total) by mouth every 8 (eight) hours as needed for moderate pain.   15 tablet   0   . metoprolol tartrate (LOPRESSOR) 25 MG tablet   Oral   Take 1 tablet (25 mg total) by mouth 2 (two) times daily.   60 tablet   0   . sulfamethoxazole-trimethoprim (BACTRIM DS,SEPTRA DS) 800-160 MG tablet   Oral   Take 1 tablet by mouth 2 (two) times daily.   20 tablet   0   . thiamine 100 MG tablet   Oral   Take 1 tablet (100 mg total) by mouth daily.   14 tablet   0   . traMADol (ULTRAM) 50 MG tablet   Oral   Take 1 tablet (50 mg  total) by mouth every 6 (six) hours as needed for moderate pain.   12 tablet   0     Allergies Review of patient's allergies indicates no known allergies.  Family History  Problem Relation Age of Onset  . Alcohol abuse Mother   . Alcohol abuse Father     Social History Social History  Substance Use Topics  . Smoking status: Current Every Day Smoker -- 0.50 packs/day  . Smokeless tobacco: Not on file  . Alcohol Use: 36.0 oz/week    60 Cans of beer per week    Review of Systems Constitutional: No fever/chills Eyes: No visual changes. ENT: No sore throat. Cardiovascular: Denies chest pain. Respiratory: Denies shortness of breath. Gastrointestinal: No abdominal pain.  No nausea, no vomiting.  No diarrhea.  No constipation. Genitourinary: Negative for dysuria. Musculoskeletal: Negative for back pain. Skin: Negative for rash. Neurological: Negative for headaches, focal weakness or numbness.  10-point ROS otherwise negative.  ____________________________________________   PHYSICAL EXAM:  VITAL SIGNS: ED Triage Vitals  Enc Vitals Group     BP 12/03/15 1045 134/92  mmHg     Pulse Rate 12/03/15 1045 108     Resp 12/03/15 1045 18     Temp 12/03/15 1045 98.7 F (37.1 C)     Temp Source 12/03/15 1045 Oral     SpO2 12/03/15 1045 99 %     Weight 12/03/15 1045 165 lb (74.844 kg)     Height 12/03/15 1045 6' (1.829 m)     Head Cir --      Peak Flow --      Pain Score --      Pain Loc --      Pain Edu? --      Excl. in GC? --     Constitutional: Alert and oriented. Well appearing and in no acute distress. Eyes: Conjunctivae are normal. PERRL. EOMI. Head: Atraumatic. Nose: No congestion/rhinnorhea. Mouth/Throat: Mucous membranes are moist.  Oropharynx non-erythematous. Neck: No stridor.   Cardiovascular: Normal rate, regular rhythm. Grossly normal heart sounds.  Good peripheral circulation. Respiratory: Normal respiratory effort.  No retractions. Lungs  CTAB. Gastrointestinal: Soft and nontender. No distention. No abdominal bruits. No CVA tenderness. Musculoskeletal: No lower extremity tenderness nor edema.  No joint effusions. Neurologic:  Normal speech and language. No gross focal neurologic deficits are appreciated. Mild ataxia on finger to nose testing bilaterally. Skin:  Skin is warm, dry and intact. No rash noted. Psychiatric: Mood and affect are normal. Speech and behavior are normal.  ____________________________________________   LABS (all labs ordered are listed, but only abnormal results are displayed)  Labs Reviewed  COMPREHENSIVE METABOLIC PANEL - Abnormal; Notable for the following:    Sodium 113 (*)    Chloride 80 (*)    CO2 21 (*)    AST 110 (*)    ALT 79 (*)    Alkaline Phosphatase 227 (*)    Total Bilirubin 1.3 (*)    All other components within normal limits  CBC - Abnormal; Notable for the following:    WBC 11.0 (*)    RBC 3.64 (*)    Hemoglobin 11.6 (*)    HCT 32.8 (*)    RDW 14.9 (*)    All other components within normal limits  ETHANOL  URINE DRUG SCREEN, QUALITATIVE ([facility] ONLY)   ____________________________________________  EKG  ED ECG REPORT I, Arelia LongestSchaevitz,  Shaunak Kreis M, the attending physician, personally viewed and interpreted this ECG.   Date: 12/03/2015  EKG Time: 1428  Rate: 78  Rhythm: normal sinus rhythm  Axis: Left axis deviation  Intervals:none  ST&T Change: No ST segment elevation or depression. No abnormal T-wave inversion.  ____________________________________________  RADIOLOGY   ____________________________________________   PROCEDURES  CRITICAL CARE Performed by: Arelia LongestSchaevitz,  [place] M   Total critical care time: 35 minutes  Critical care time was exclusive of separately billable procedures and treating other patients.  Critical care was necessary to treat or prevent imminent or life-threatening deterioration.  Critical care was time spent personally by me on the  following activities: development of treatment plan with patient and/or surrogate as well as nursing, discussions with consultants, evaluation of patient's response to treatment, examination of patient, obtaining history from patient or surrogate, ordering and performing treatments and interventions, ordering and review of laboratory studies, ordering and review of radiographic studies, pulse oximetry and re-evaluation of patient's condition.   ____________________________________________   INITIAL IMPRESSION / ASSESSMENT AND PLAN / ED COURSE  Pertinent labs & imaging results that were available during my care of the patient were reviewed by me and considered in  my medical decision making (see chart for details).  ----------------------------------------- 2:36 PM on 12/03/2015 -----------------------------------------  Patient's sodium is critically low at 113. I asked the patient if he drinks an excessive amount of water and he says "no, only when a lot of beer." Hyponatremia likely related to drinking. I believe this also explains his ambulatory dysfunction. ____________________________________________   FINAL CLINICAL IMPRESSION(S) / ED DIAGNOSES  I put a treatment area.    Myrna Blazer, MD 12/03/15 [phone]

## 2015-12-03 NOTE — H&P (Signed)
[facility] -  at [facility]   PATIENT NAME: Luis [name]    MR#:  [id number]  DATE OF BIRTH:  April 05, 1951  DATE OF ADMISSION:  12/03/2015  PRIMARY CARE PHYSICIAN: No PCP Per Patient   REQUESTING/REFERRING PHYSICIAN: Dr. Langston Masker  CHIEF COMPLAINT:   Chief Complaint  Patient presents with  . Alcohol Intoxication  . Weakness    HISTORY OF PRESENT ILLNESS:  Jobanny Mavis  is a 65 y.o. male with a known history of htn,, alcohol abuse, tobacco abuse brought in because of multiple falls in the last 3 days. Patient denies any dizziness. Denies any nausea or fever or vomiting. No abdominal pain. No loss of consciousness. No weakness in hands or legs. No slurred speech. Patient says that he just falls. Sodium 113 on now lab data.  PAST MEDICAL HISTORY:   Past Medical History  Diagnosis Date  . GERD (gastroesophageal reflux disease)   . Alcohol abuse     PAST SURGICAL HISTOIRY:   Past Surgical History  Procedure Laterality Date  . Cholecystectomy      SOCIAL HISTORY:   Social History  Substance Use Topics  . Smoking status: Current Every Day Smoker -- 0.50 packs/day  . Smokeless tobacco: Not on file  . Alcohol Use: 36.0 oz/week    60 Cans of beer per week    FAMILY HISTORY:   Family History  Problem Relation Age of Onset  . Alcohol abuse Mother   . Alcohol abuse Father     DRUG ALLERGIES:  No Known Allergies  REVIEW OF SYSTEMS:  CONSTITUTIONAL: No fever, fatigue or weakness.  EYES: No blurred or double vision.  EARS, NOSE, AND THROAT: No tinnitus or ear pain.  RESPIRATORY: Has chronic cough. shortness of breath, wheezing or hemoptysis.  CARDIOVASCULAR: No chest pain, orthopnea, edema.  GASTROINTESTINAL: No nausea, vomiting, diarrhea or abdominal pain.  GENITOURINARY: No dysuria, hematuria.  ENDOCRINE: No polyuria, nocturia,  HEMATOLOGY: No anemia, easy bruising or bleeding SKIN: No rash or lesion. MUSCULOSKELETAL: No joint  pain or arthritis.   NEUROLOGIC: No tingling, numbness, weakness.  PSYCHIATRY: No anxiety or depression.   MEDICATIONS AT HOME:   Prior to Admission medications   Medication Sig Start Date End Date Taking? Authorizing Provider  esomeprazole (NEXIUM) 20 MG capsule Take 20 mg by mouth daily at 12 noon.   Yes Historical Provider, MD  colchicine 0.6 MG tablet Take 1 tablet (0.6 mg total) by mouth daily. 07/21/15 07/20/16  Joni Reining, PA-C  folic acid (FOLVITE) 1 MG tablet Take 1 tablet (1 mg total) by mouth daily. 06/20/15   Shaune Pollack, MD      VITAL SIGNS:  Blood pressure 146/98, pulse 78, temperature 98.6 F (37 C), temperature source Oral, resp. rate 18, height 6' (1.829 m), weight 74.844 kg (165 lb), SpO2 99 %.  PHYSICAL EXAMINATION:  GENERAL:  65 y.o.-year-old patient lying in the bed with no acute distress.  EYES: Pupils equal, round, reactive to light and accommodation. No scleral icterus. Extraocular muscles intact.  HEENT: Head atraumatic, normocephalic. Oropharynx and nasopharynx clear.  NECK:  Supple, no jugular venous distention. No thyroid enlargement, no tenderness.  LUNGS: Normal breath sounds bilaterally, faint  wheezing bilaterally. rales,rhonchi or crepitation. No use of accessory muscles of respiration.  CARDIOVASCULAR: S1, S2 normal. No murmurs, rubs, or gallops.  ABDOMEN: Soft, nontender, nondistended. Bowel sounds present. No organomegaly or mass.  EXTREMITIES: No pedal edema, cyanosis, or clubbing.  NEUROLOGIC: Cranial nerves II through XII are  intact. Muscle strength 5/5 in all extremities. Sensation intact. Gait not checked.  PSYCHIATRIC: The patient is alert and oriented x 3.  SKIN: No obvious rash, lesion, or ulcer.   LABORATORY PANEL:   CBC  Recent Labs Lab 12/03/15 1057  WBC 11.0*  HGB 11.6*  HCT 32.8*  PLT 209   ------------------------------------------------------------------------------------------------------------------  Chemistries    Recent Labs Lab 12/03/15 1057  NA 113*  K 4.1  CL 80*  CO2 21*  GLUCOSE 87  BUN 18  CREATININE 1.19  CALCIUM 9.0  AST 110*  ALT 79*  ALKPHOS 227*  BILITOT 1.3*   ------------------------------------------------------------------------------------------------------------------  Cardiac Enzymes No results for input(s): TROPONINI in the last 168 hours. ------------------------------------------------------------------------------------------------------------------  RADIOLOGY:  No results found.  EKG:   Orders placed or performed during the hospital encounter of 12/03/15  . ED EKG  . ED EKG   normal sinus rhythm 78 bpm  IMPRESSION AND PLAN:   #1 acute hyponatremia due beer potomania; start IV hydration, check serum osmolality urine osmolality and urine sodium.;bmp q 6hrs 2. Fall secondary to hyponatremia: Physical therapy consult. #3 COPD with continued tobacco abuse: Continue inhalers and Spiriva, Combivent. Advised to quit smoking And Offered Assistance. Counseled for 10 min. 4.htn;continue B blocker.    All the records are reviewed and case discussed with ED provider. Management plans discussed with the patient, family and they are in agreement.  CODE STATUS: ful  TOTAL TIME TAKING CARE OF THIS PATIENT: 55minutes.    Katha HammingKONIDENA,Sian Joles M.D on 12/03/2015 at 2:54 PM  Between 7am to 6pm - Pager - [phone]  After 6pm go to [email or web address] - password [email or web address] [facility]  [facility] [place] [facility]  Office  [phone]  CC: Primary care physician; No PCP Per Patient  Note: This dictation was prepared with Dragon dictation along with smaller phrase technology. Any transcriptional errors that result from this process are unintentional.

## 2015-12-04 DIAGNOSIS — E440 Moderate protein-calorie malnutrition: Secondary | ICD-10-CM | POA: Insufficient documentation

## 2015-12-04 DIAGNOSIS — F10231 Alcohol dependence with withdrawal delirium: Secondary | ICD-10-CM | POA: Diagnosis not present

## 2015-12-04 LAB — BASIC METABOLIC PANEL
ANION GAP: 6 (ref 5–15)
BUN: 14 mg/dL (ref 6–20)
CHLORIDE: 92 mmol/L — AB (ref 101–111)
CO2: 22 mmol/L (ref 22–32)
CREATININE: 1.18 mg/dL (ref 0.61–1.24)
Calcium: 7.8 mg/dL — ABNORMAL LOW (ref 8.9–10.3)
GFR calc non Af Amer: >60 mL/min (ref >60)
Glucose, Bld: 92 mg/dL (ref 65–99)
POTASSIUM: 3.5 mmol/L (ref 3.5–5.1)
SODIUM: 120 mmol/L — AB (ref 135–145)

## 2015-12-04 LAB — CBC
HEMATOCRIT: 27.5 % — AB (ref 40.0–52.0)
Hemoglobin: 9.7 g/dL — ABNORMAL LOW (ref 13.0–18.0)
MCH: 31.9 pg (ref 26.0–34.0)
MCHC: 35.4 g/dL (ref 32.0–36.0)
MCV: 90.3 fL (ref 80.0–100.0)
PLATELETS: 185 10*3/uL (ref 150–440)
RBC: 3.05 MIL/uL — AB (ref 4.40–5.90)
RDW: 14.5 % (ref 11.5–14.5)
WBC: 8 10*3/uL (ref 3.8–10.6)

## 2015-12-04 MED ORDER — LORAZEPAM 2 MG PO TABS
0.0000 mg | ORAL_TABLET | Freq: Four times a day (QID) | ORAL | Status: AC
Start: 2015-12-04 — End: 2015-12-06
  Administered 2015-12-04 – 2015-12-06 (×6): 2 mg via ORAL
  Administered 2015-12-06: 4 mg via ORAL
  Filled 2015-12-04 (×7): qty 1
  Filled 2015-12-04: qty 2

## 2015-12-04 MED ORDER — FOLIC ACID 1 MG PO TABS
1.0000 mg | ORAL_TABLET | Freq: Every day | ORAL | Status: DC
  Administered 2015-12-04 – 2015-12-09 (×6): 1 mg via ORAL
  Filled 2015-12-04 (×6): qty 1

## 2015-12-04 MED ORDER — ENSURE ENLIVE PO LIQD
237.0000 mL | Freq: Two times a day (BID) | ORAL | Status: DC
  Administered 2015-12-05 – 2015-12-09 (×9): 237 mL via ORAL

## 2015-12-04 MED ORDER — PANTOPRAZOLE SODIUM 40 MG PO TBEC
40.0000 mg | DELAYED_RELEASE_TABLET | Freq: Every day | ORAL | Status: DC
  Administered 2015-12-04 – 2015-12-09 (×6): 40 mg via ORAL
  Filled 2015-12-04 (×6): qty 1

## 2015-12-04 MED ORDER — LORAZEPAM 1 MG PO TABS: 1.0000 mg | ORAL_TABLET | Freq: Four times a day (QID) | ORAL | Status: AC | PRN

## 2015-12-04 MED ORDER — VITAMIN B-1 100 MG PO TABS
100.0000 mg | ORAL_TABLET | Freq: Every day | ORAL | Status: DC
  Administered 2015-12-04 – 2015-12-09 (×6): 100 mg via ORAL
  Filled 2015-12-04 (×6): qty 1

## 2015-12-04 MED ORDER — LORAZEPAM 2 MG PO TABS
0.0000 mg | ORAL_TABLET | Freq: Two times a day (BID) | ORAL | Status: AC
  Administered 2015-12-06 – 2015-12-07 (×2): 2 mg via ORAL
  Filled 2015-12-04: qty 1

## 2015-12-04 MED ORDER — NICOTINE 14 MG/24HR TD PT24
14.0000 mg | MEDICATED_PATCH | Freq: Every day | TRANSDERMAL | Status: DC
Start: 2015-12-04 — End: 2015-12-09
  Administered 2015-12-04 – 2015-12-09 (×6): 14 mg via TRANSDERMAL
  Filled 2015-12-04 (×6): qty 1

## 2015-12-04 MED ORDER — LORAZEPAM 2 MG/ML IJ SOLN
1.0000 mg | Freq: Four times a day (QID) | INTRAMUSCULAR | Status: AC | PRN
  Administered 2015-12-06: 1 mg via INTRAVENOUS
  Filled 2015-12-04: qty 1

## 2015-12-04 MED ORDER — ADULT MULTIVITAMIN W/MINERALS CH
1.0000 | ORAL_TABLET | Freq: Every day | ORAL | Status: DC
  Administered 2015-12-04 – 2015-12-09 (×6): 1 via ORAL
  Filled 2015-12-04 (×6): qty 1

## 2015-12-04 MED ORDER — THIAMINE HCL 100 MG/ML IJ SOLN
100.0000 mg | Freq: Every day | INTRAMUSCULAR | Status: DC
  Filled 2015-12-04: qty 2

## 2015-12-04 NOTE — Progress Notes (Signed)
Initial Nutrition Assessment  DOCUMENTATION CODES:   Non-severe (moderate) malnutrition in context of social or environmental circumstances  INTERVENTION:  Meals and snacks: Cater to pt preferences, monitor intake Medical Nutrition Supplement Therapy: Recommend ensure enlive BID for added nutrition   NUTRITION DIAGNOSIS:   Inadequate oral intake related to acute illness as evidenced by per patient/family report.    GOAL:   Patient will meet greater than or equal to 90% of their needs    MONITOR:    (Energy intake, Electrolyte and renal profile)  REASON FOR ASSESSMENT:   Malnutrition Screening Tool    ASSESSMENT:      Pt admitted with hyponatremia, Etoh use, weakness, falls  Past Medical History  Diagnosis Date  . GERD (gastroesophageal reflux disease)   . Alcohol abuse     Current Nutrition: eating about 50% of meals per pt repor  Food/Nutrition-Related History: pt reports decreased intake for the past 3 weeks, eating 50% or less of meals   Scheduled Medications:  . colchicine  0.6 mg Oral Daily  . enoxaparin (LOVENOX) injection  40 mg Subcutaneous Q24H  . metoprolol tartrate  25 mg Oral BID  . nicotine  14 mg Transdermal Daily  . pantoprazole  40 mg Oral Daily  . thiamine  100 mg Oral Daily  . tiotropium  18 mcg Inhalation Daily    Continuous Medications:  . sodium chloride 75 mL/hr at 12/03/15 1653     Electrolyte/Renal Profile and Glucose Profile:   Recent Labs Lab 12/03/15 1057 12/04/15 0502  NA 113* 120*  K 4.1 3.5  CL 80* 92*  CO2 21* 22  BUN 18 14  CREATININE 1.19 1.18  CALCIUM 9.0 7.8*  GLUCOSE 87 92   Protein Profile:   Recent Labs Lab 12/03/15 1057  ALBUMIN 3.8    Gastrointestinal Profile: Last BM: 3/10   Nutrition-Focused Physical Exam Findings: Nutrition-Focused physical exam completed. Findings are mild fat depletion, mild muscle depletion, and no edema.      Weight Change: 16% wt loss in the last 5  months    Diet Order:  Diet regular Room service appropriate?: Yes; Fluid consistency:: Thin  Skin:   reviewed   Height:   Ht Readings from Last 1 Encounters:  12/03/15 6' (1.829 m)    Weight:   Wt Readings from Last 1 Encounters:  12/03/15 142 lb 6.4 oz (64.592 kg)    Ideal Body Weight:     BMI:  Body mass index is 19.31 kg/(m^2).  Estimated Nutritional Needs:   Kcal:  BEE 1473 kcals (IF 1.0-1.3, AF 1.3) [id number] kcals/d  Protein:  (1.0-1.2 g/kg) 65-78 g/d  Fluid:  (30-6135ml/kg) 1950-222375ml/d  EDUCATION NEEDS:   No education needs identified at this time  MODERATE Care Level  Abayomi Pattison B. Freida BusmanAllen, RD, LDN [phone] (pager) Weekend/On-Call pager [phone])

## 2015-12-04 NOTE — Progress Notes (Signed)
Patient diaphoretic and having tremors. Spoke with family and states that patient drinks daily until passing out. Patient admits to drinking 12 plus beers a day. Dr. Anne HahnWillis notified placed orders for [facility].

## 2015-12-04 NOTE — Care Management Note (Addendum)
Case Management Note  Patient Details  Name: Luis [name] MRN: [id number] Date of Birth: Sep 17, 1951  Subjective/Objective:                  Met with patient to discuss discharge planning. He is independent with daily activities. Still not PCP and admits to not trying to find one but agrees for me to find one [place]". He is [employer] so the provider should be with his insurance so I'm not sure if that is feasible. I am checking on [facility] providers in [place] to see if I can assist patient. He states the only medication problem he's been having is "getting his sleeping pill" probably because he doesn't have a physician to write an order for it. He wants a Rx for sleeping pill prior to discharge. He states his insurance will cover his Rx. However, he goes to [place] on [address] for Rx but not willing to go to [place] to physician. He still uses friends, taxi, or [employer] research (physical sciences) for transportation.   Action/Plan: I will attempt to find MD for patient.   Expected Discharge Date:                  Expected Discharge Plan:     In-House Referral:     Discharge planning Services  CM Consult  Post Acute Care Choice:    Choice offered to:  Patient  DME Arranged:    DME Agency:     HH Arranged:    [place] Agency:     Status of Service:  Completed, signed off  Medicare Important Message Given:    Date Medicare IM Given:    Medicare IM give by:    Date Additional Medicare IM Given:    Additional Medicare Important Message give by:     If discussed at [place] of Stay Meetings, dates discussed:    Additional Comments: I learned that patient is a [facility] patient- [facility] denies [facility] with [facility].  I attempted to contact [facility] at [facility] for assistance with PCP- no answer. I have asked that unit clerk to try to find patient a PCP in "downtown [place]" per patient request.    Marshell Garfinkel, RN 12/04/2015, 8:38 AM

## 2015-12-04 NOTE — Progress Notes (Addendum)
Patient ID: Luis SatoMichael [name], male   DOB: 02/06/1951, 65 y.o.   MRN: [id number] [facility] -  at [facility]   PATIENT NAME: Luis SatoMichael [name]    MR#:  [id number]  DATE OF BIRTH:  07/11/1951  SUBJECTIVE:  Came in after several non traumatic falls  REVIEW OF SYSTEMS:   Review of Systems  Constitutional: Negative for fever, chills and weight loss.  HENT: Negative for ear discharge, ear pain and nosebleeds.   Eyes: Negative for blurred vision, pain and discharge.  Respiratory: Negative for sputum production, shortness of breath, wheezing and stridor.   Cardiovascular: Negative for chest pain, palpitations, orthopnea and PND.  Gastrointestinal: Negative for nausea, vomiting, abdominal pain and diarrhea.  Genitourinary: Negative for urgency and frequency.  Musculoskeletal: Positive for falls. Negative for back pain and joint pain.  Neurological: Positive for weakness. Negative for sensory change, speech change and focal weakness.  Psychiatric/Behavioral: Negative for depression and hallucinations. The patient is not nervous/anxious.   All other systems reviewed and are negative.  Tolerating Diet:yes Tolerating PT: pending  DRUG ALLERGIES:  No Known Allergies  VITALS:  Blood pressure 139/81, pulse 69, temperature 97 F (36.1 C), temperature source Axillary, resp. rate 18, height 6' (1.829 m), weight 64.592 kg (142 lb 6.4 oz), SpO2 100 %.  PHYSICAL EXAMINATION:   Physical Exam  GENERAL:  65 y.o.-year-old patient lying in the bed with no acute distress. disheveled EYES: Pupils equal, round, reactive to light and accommodation. No scleral icterus. Extraocular muscles intact.  HEENT: Head atraumatic, normocephalic. Oropharynx and nasopharynx clear.  NECK:  Supple, no jugular venous distention. No thyroid enlargement, no tenderness.  LUNGS: Normal breath sounds bilaterally, no wheezing, rales, rhonchi. No use of accessory muscles of respiration.   CARDIOVASCULAR: S1, S2 normal. No murmurs, rubs, or gallops.  ABDOMEN: Soft, nontender, nondistended. Bowel sounds present. No organomegaly or mass.  EXTREMITIES: No cyanosis, clubbing or edema b/l.    NEUROLOGIC: Cranial nerves II through XII are intact. No focal Motor or sensory deficits b/l.   PSYCHIATRIC:  patient is alert and oriented x 3.  SKIN: No obvious rash, lesion, or ulcer.   LABORATORY PANEL:  CBC  Recent Labs Lab 12/04/15 0502  WBC 8.0  HGB 9.7*  HCT 27.5*  PLT 185    Chemistries   Recent Labs Lab 12/03/15 1057 12/04/15 0502  NA 113* 120*  K 4.1 3.5  CL 80* 92*  CO2 21* 22  GLUCOSE 87 92  BUN 18 14  CREATININE 1.19 1.18  CALCIUM 9.0 7.8*  AST 110*  --   ALT 79*  --   ALKPHOS 227*  --   BILITOT 1.3*  --    Cardiac Enzymes No results for input(s): TROPONINI in the last 168 hours. RADIOLOGY:  No results found. ASSESSMENT AND PLAN:   Luis [name] is a 65 y.o. male with a known history of htn,, alcohol abuse, tobacco abuse brought in because of multiple falls in the last 3 days. Patient denies any dizziness. Denies any nausea or fever or vomiting.   #1 acute hyponatremia due beer potomania - IV hydration, check serum osmolality urine osmolality and urine sodium.;bmp q 6hrs Sodium improving 113---120  2. Fall secondary to hyponatremia: Physical therapy consult.  #3 COPD with continued tobacco abuse: Continue inhalers and Spiriva, Combivent. Advised to quit smoking  4.htn -continue B blocker.  Case discussed with Care Management/Social Worker. Management plans discussed with the patient, family and they are in agreement.  CODE STATUS: full  DVT Prophylaxis: lovenox  TOTAL TIME TAKING CARE OF THIS PATIENT: * .  >50% time spent on counselling and coordination of care  POSSIBLE D/C IN 1-2 DAYS, DEPENDING ON CLINICAL CONDITION.  Note: This dictation was prepared with Dragon dictation along with smaller phrase technology. Any  transcriptional errors that result from this process are unintentional.  Tejasvi Brissett M.D on 12/04/2015 at 5:17 PM  Between 7am to 6pm - Pager - [phone]  After 6pm go to [email or web address] - password [email or web address] [facility]  [place] [facility]  Office  [phone]  CC: Primary care physician; No PCP Per Patient

## 2015-12-05 LAB — BASIC METABOLIC PANEL
Anion gap: 5 (ref 5–15)
BUN: 10 mg/dL (ref 6–20)
CO2: 20 mmol/L — ABNORMAL LOW (ref 22–32)
CREATININE: 0.97 mg/dL (ref 0.61–1.24)
Calcium: 7.7 mg/dL — ABNORMAL LOW (ref 8.9–10.3)
Chloride: 99 mmol/L — ABNORMAL LOW (ref 101–111)
Glucose, Bld: 92 mg/dL (ref 65–99)
POTASSIUM: 3.8 mmol/L (ref 3.5–5.1)
SODIUM: 124 mmol/L — AB (ref 135–145)

## 2015-12-05 NOTE — Progress Notes (Signed)
Patient ID: Luis [name], male   DOB: 07/18/1951, 65 y.o.   MRN: [id number] [facility] [place] at [facility]   PATIENT NAME: Luis SatoMichael [name]    MR#:  [id number]  DATE OF BIRTH:  09/23/1951  SUBJECTIVE:  Came in after several non traumatic falls- noted to have low sodium. Significant alcohol history. Drowsy- sedated this AM- received ativan last night for tremors and diaphoresis- withdrawal symptoms. Denies any pain or complaints.  REVIEW OF SYSTEMS:   Review of Systems  Unable to perform ROS: acuity of condition  Patient sedated.  Tolerating Diet:yes Tolerating PT: pending  DRUG ALLERGIES:  No Known Allergies  VITALS:  Blood pressure 115/69, pulse 63, temperature 97.6 F (36.4 C), temperature source Axillary, resp. rate 12, height 6' (1.829 m), weight 64.592 kg (142 lb 6.4 oz), SpO2 99 %.  PHYSICAL EXAMINATION:   Physical Exam  GENERAL:  65 y.o.-year-old patient lying in the bed with no acute distress. disheveled EYES: Pupils equal, round, reactive to light and accommodation. No scleral icterus. Extraocular muscles intact.  HEENT: Head atraumatic, normocephalic. Oropharynx and nasopharynx clear.  NECK:  Supple, no jugular venous distention. No thyroid enlargement, no tenderness.  LUNGS: Normal breath sounds bilaterally, no wheezing, rales, rhonchi. No use of accessory muscles of respiration.  CARDIOVASCULAR: S1, S2 normal. No murmurs, rubs, or gallops.  ABDOMEN: Soft, nontender, nondistended. Bowel sounds present. No organomegaly or mass.  EXTREMITIES: No cyanosis, clubbing or edema b/l.    NEUROLOGIC: Cranial nerves II through XII are intact. No focal Motor or sensory deficits b/l.   PSYCHIATRIC:  patient is sleeping- due to ativan dose- but arousable. Unable to test orientation as not very cooperative for exam. SKIN: No obvious rash, lesion, or ulcer.   LABORATORY PANEL:  CBC  Recent Labs Lab 12/04/15 0502  WBC 8.0  HGB 9.7*  HCT 27.5*   PLT 185    Chemistries   Recent Labs Lab 12/03/15 1057  12/05/15 0335  NA 113*  < > 124*  K 4.1  < > 3.8  CL 80*  < > 99*  CO2 21*  < > 20*  GLUCOSE 87  < > 92  BUN 18  < > 10  CREATININE 1.19  < > 0.97  CALCIUM 9.0  < > 7.7*  AST 110*  --   --   ALT 79*  --   --   ALKPHOS 227*  --   --   BILITOT 1.3*  --   --   < > = values in this interval not displayed. Cardiac Enzymes No results for input(s): TROPONINI in the last 168 hours. RADIOLOGY:  No results found. ASSESSMENT AND PLAN:   Luis [name] is a 65 y.o. male with a known history of htn,, alcohol abuse, tobacco abuse brought in because of multiple falls in the last 3 days. Patient denies any dizziness. Denies any nausea or fever or vomiting.   #1 Acute hyponatremia due beer potomania- continue  IV hydration with NS - slowly improving sodium- upto 124 now, from 113 on admission -Low serum osm. Cont to monitor  2. Fall secondary to hyponatremia: Physical therapy consulted.  #3 COPD with continued tobacco abuse: Continue prn nebs and Spiriva. Advised to quit smoking. On nicotine patch  4.htn- on metoprolol  5. DVT Prophylaxis: lovenox  6,. Alcohol abuse- started on CIWA protocol. Cont ativan.  7. Anemia- drop since admission due to dilutional. Monitor, no active bleeding at this time. Recheck in AM  Case  discussed with Care Management/Social Worker. Management plans discussed with the patient, family and they are in agreement.  CODE STATUS: full   TOTAL TIME TAKING CARE OF THIS PATIENT: 32 minutes.  >50% time spent on counselling and coordination of care  POSSIBLE D/C IN 1-2 DAYS, DEPENDING ON CLINICAL CONDITION.  Note: This dictation was prepared with Dragon dictation along with smaller phrase technology. Any transcriptional errors that result from this process are unintentional.  Ramadan Couey M.D on 12/05/2015 at 7:23 AM  Between 7am to 6pm - Pager - [phone]  After 6pm go to [email or web address]  - password [email or web address] [facility]  [place] [facility]  Office  [phone]  CC: Primary care physician; No PCP Per Patient

## 2015-12-05 NOTE — Plan of Care (Signed)
Problem: Safety: Goal: Ability to remain free from injury will improve Outcome: Progressing Floor mats intact, room near nursing station, fall risk assessment complete

## 2015-12-06 LAB — BASIC METABOLIC PANEL
Anion gap: 6 (ref 5–15)
BUN: 10 mg/dL (ref 6–20)
CALCIUM: 7.7 mg/dL — AB (ref 8.9–10.3)
CHLORIDE: 102 mmol/L (ref 101–111)
CO2: 20 mmol/L — AB (ref 22–32)
CREATININE: 0.9 mg/dL (ref 0.61–1.24)
GFR calc Af Amer: >60 mL/min (ref >60)
GFR calc non Af Amer: >60 mL/min (ref >60)
GLUCOSE: 87 mg/dL (ref 65–99)
Potassium: 3.7 mmol/L (ref 3.5–5.1)
Sodium: 128 mmol/L — ABNORMAL LOW (ref 135–145)

## 2015-12-06 LAB — IRON AND TIBC
Iron: 54 ug/dL (ref 45–182)
SATURATION RATIOS: 29 % (ref 17.9–39.5)
TIBC: 188 ug/dL — ABNORMAL LOW (ref 250–450)
UIBC: 134 ug/dL

## 2015-12-06 LAB — CBC
HEMATOCRIT: 24.5 % — AB (ref 40.0–52.0)
HEMOGLOBIN: 8.5 g/dL — AB (ref 13.0–18.0)
MCH: 31.8 pg (ref 26.0–34.0)
MCHC: 34.6 g/dL (ref 32.0–36.0)
MCV: 91.9 fL (ref 80.0–100.0)
Platelets: 211 10*3/uL (ref 150–440)
RBC: 2.67 MIL/uL — ABNORMAL LOW (ref 4.40–5.90)
RDW: 14.5 % (ref 11.5–14.5)
WBC: 7 10*3/uL (ref 3.8–10.6)

## 2015-12-06 LAB — RETICULOCYTES
RBC.: 2.65 MIL/uL — AB (ref 4.40–5.90)
RETIC CT PCT: 0.9 % (ref 0.4–3.1)
Retic Count, Absolute: 23.9 10*3/uL (ref 19.0–183.0)

## 2015-12-06 LAB — FOLATE: Folate: 12.9 ng/mL (ref >5.9)

## 2015-12-06 LAB — FERRITIN: FERRITIN: 386 ng/mL — AB (ref 24–336)

## 2015-12-06 MED ORDER — TAMSULOSIN HCL 0.4 MG PO CAPS
0.4000 mg | ORAL_CAPSULE | Freq: Every day | ORAL | Status: DC
  Administered 2015-12-06 – 2015-12-09 (×4): 0.4 mg via ORAL
  Filled 2015-12-06 (×4): qty 1

## 2015-12-06 MED ORDER — POLYETHYLENE GLYCOL 3350 17 G PO PACK: 17.0000 g | PACK | Freq: Every day | ORAL | Status: DC | PRN

## 2015-12-06 MED ORDER — DOCUSATE SODIUM 100 MG PO CAPS
100.0000 mg | ORAL_CAPSULE | Freq: Two times a day (BID) | ORAL | Status: DC
  Administered 2015-12-06 – 2015-12-09 (×6): 100 mg via ORAL
  Filled 2015-12-06 (×6): qty 1

## 2015-12-06 NOTE — Progress Notes (Signed)
Patient ID: Luis [name], male   DOB: [birth year], 65 y.o.   MRN: [id number] [facility] - [place] at [facility]   PATIENT NAME: Luis [name]    MR#:  [id number]  DATE OF BIRTH:  [birth year]  SUBJECTIVE:   - Patient is more alert and coherent today, still has periods of agitation and tremors present too. - worried about being discharged too soon- comforted him.  REVIEW OF SYSTEMS:   Review of Systems  Constitutional: Negative for fever and chills.  Respiratory: Negative for cough, shortness of breath and wheezing.   Cardiovascular: Negative for chest pain, palpitations and leg swelling.  Gastrointestinal: Negative for nausea, vomiting, abdominal pain, diarrhea and constipation.  Genitourinary: Negative for dysuria.  Neurological: Positive for tremors and weakness. Negative for dizziness, seizures and headaches.  Psychiatric/Behavioral: The patient is nervous/anxious.   Patient sedated.  Tolerating Diet:yes Tolerating PT: pending  DRUG ALLERGIES:  No Known Allergies  VITALS:  Blood pressure 145/78, pulse 72, temperature 98.2 F (36.8 C), temperature source Oral, resp. rate 20, height 6' (1.829 m), weight 64.592 kg (142 lb 6.4 oz), SpO2 100 %.  PHYSICAL EXAMINATION:   Physical Exam  GENERAL:  65 y.o.-year-old patient lying in the bed with no acute distress. disheveled EYES: Pupils equal, round, reactive to light and accommodation. No scleral icterus. Extraocular muscles intact.  HEENT: Head atraumatic, normocephalic. Oropharynx and nasopharynx clear.  NECK:  Supple, no jugular venous distention. No thyroid enlargement, no tenderness.  LUNGS: Normal breath sounds bilaterally, no wheezing, rales, rhonchi. No use of accessory muscles of respiration.  CARDIOVASCULAR: S1, S2 normal. No murmurs, rubs, or gallops.  ABDOMEN: Soft, nontender, nondistended. Bowel sounds present. No organomegaly or mass.  EXTREMITIES: No cyanosis, clubbing or edema b/l.     NEUROLOGIC: Cranial nerves II through XII are intact. No focal Motor or sensory deficits b/l.   PSYCHIATRIC:  Alert and oriented x 2, with occasional periods of agitation. Speech is thick and slurred SKIN: No obvious rash, lesion, or ulcer.   LABORATORY PANEL:  CBC  Recent Labs Lab 12/06/15 0337  WBC 7.0  HGB 8.5*  HCT 24.5*  PLT 211    Chemistries   Recent Labs Lab 12/03/15 1057  12/06/15 0337  NA 113*  < > 128*  K 4.1  < > 3.7  CL 80*  < > 102  CO2 21*  < > 20*  GLUCOSE 87  < > 87  BUN 18  < > 10  CREATININE 1.19  < > 0.90  CALCIUM 9.0  < > 7.7*  AST 110*  --   --   ALT 79*  --   --   ALKPHOS 227*  --   --   BILITOT 1.3*  --   --   < > = values in this interval not displayed. Cardiac Enzymes No results for input(s): TROPONINI in the last 168 hours. RADIOLOGY:  No results found. ASSESSMENT AND PLAN:   Luis [name] is a 65 y.o. male with a known history of htn,, alcohol abuse, tobacco abuse brought in because of multiple falls in the last 3 days. Patient denies any dizziness. Denies any nausea or fever or vomiting.   #1 Acute hyponatremia due beer potomania- continue  IV hydration with NS - slowly improving sodium- upto 128 now- close to his baseline, from 113 on admission -Low serum osm. Cont to monitor Improving mental status now  #2 Metabolic encephalopathy- from hyponatremia on admission Improving  2. Fall secondary to hyponatremia:  Physical therapy consulted.  #3 COPD with continued tobacco abuse: Continue prn nebs and Spiriva. Advised to quit smoking. On nicotine patch  4.htn- on metoprolol  5. DVT Prophylaxis: lovenox - on hold today  6,. Alcohol abuse- started on CIWA protocol. Cont ativan. Psych consult if remains alert tomorrow for alc rehab  7. Anemia- drop since admission due to dilutional. Monitor, no active bleeding at this time. Check anemia labs and also hold lovenox now  Case discussed with Care Management/Social  Worker. Management plans discussed with the patient, family and they are in agreement.  CODE STATUS: full Left a voice mail for his wife Sofie Hartiganina Valliant today.   TOTAL TIME TAKING CARE OF THIS PATIENT: 32 minutes.  >50% time spent on counselling and coordination of care  POSSIBLE D/C IN 1-2 DAYS, DEPENDING ON CLINICAL CONDITION.  Note: This dictation was prepared with Dragon dictation along with smaller phrase technology. Any transcriptional errors that result from this process are unintentional.  Cyann Venti M.D on 12/06/2015 at 12:54 PM  Between 7am to 6pm - Pager - [phone]  After 6pm go to [email or web address] - password [email or web address] [facility]  [facility] [place] [facility]  Office  [phone]  CC: Primary care physician; No PCP Per Patient

## 2015-12-06 NOTE — Progress Notes (Signed)
Pt has not voided since 4am. Last bladder scan 324. rn informed dr Nemiah Commanderkalisetti . md ordered in and out ,flomax .4mg  po qd. Bowel regime colace 100mg  po bid,miralax 17gr qd prn

## 2015-12-06 NOTE — Progress Notes (Signed)
rn spoke with dr Nemiah Commanderkalisetti WU:[id number]:EXWIFE CONCERNS  Re;MAYBE UNDERLINING PSYCHE ISSUES AND REQUESTING PSYCHE EVAL. MD TO ORDER PSYCHE CONSULT FOR DR CLAPACS ON MONDAY

## 2015-12-06 NOTE — Care Management Important Message (Signed)
Important Message  Patient Details  Name: Luis SatoMichael [name] MRN: [id number] Date of Birth: 01/19/1951   Medicare Important Message Given:  Yes    Harkirat Orozco A, RN 12/06/2015, 4:06 PM

## 2015-12-07 DIAGNOSIS — F10231 Alcohol dependence with withdrawal delirium: Principal | ICD-10-CM

## 2015-12-07 DIAGNOSIS — F10931 Alcohol use, unspecified with withdrawal delirium: Secondary | ICD-10-CM

## 2015-12-07 DIAGNOSIS — F1010 Alcohol abuse, uncomplicated: Secondary | ICD-10-CM

## 2015-12-07 LAB — CBC
HCT: 24 % — ABNORMAL LOW (ref 40.0–52.0)
HEMOGLOBIN: 8.2 g/dL — AB (ref 13.0–18.0)
MCH: 31.3 pg (ref 26.0–34.0)
MCHC: 34.2 g/dL (ref 32.0–36.0)
MCV: 91.3 fL (ref 80.0–100.0)
Platelets: 239 10*3/uL (ref 150–440)
RBC: 2.63 MIL/uL — AB (ref 4.40–5.90)
RDW: 14.6 % — ABNORMAL HIGH (ref 11.5–14.5)
WBC: 6.3 10*3/uL (ref 3.8–10.6)

## 2015-12-07 LAB — BASIC METABOLIC PANEL
Anion gap: 6 (ref 5–15)
BUN: 10 mg/dL (ref 6–20)
CHLORIDE: 104 mmol/L (ref 101–111)
CO2: 20 mmol/L — ABNORMAL LOW (ref 22–32)
Calcium: 8.1 mg/dL — ABNORMAL LOW (ref 8.9–10.3)
Creatinine, Ser: 0.85 mg/dL (ref 0.61–1.24)
GFR calc Af Amer: >60 mL/min (ref >60)
GFR calc non Af Amer: >60 mL/min (ref >60)
Glucose, Bld: 97 mg/dL (ref 65–99)
POTASSIUM: 3.7 mmol/L (ref 3.5–5.1)
SODIUM: 130 mmol/L — AB (ref 135–145)

## 2015-12-07 LAB — VITAMIN B12: Vitamin B-12: 978 pg/mL — ABNORMAL HIGH (ref 180–914)

## 2015-12-07 MED ORDER — TRAMADOL HCL 50 MG PO TABS
50.0000 mg | ORAL_TABLET | Freq: Four times a day (QID) | ORAL | Status: DC | PRN
  Administered 2015-12-07: 50 mg via ORAL
  Filled 2015-12-07: qty 1

## 2015-12-07 MED ORDER — AMLODIPINE BESYLATE 5 MG PO TABS
5.0000 mg | ORAL_TABLET | Freq: Every day | ORAL | Status: DC
  Administered 2015-12-07 – 2015-12-09 (×3): 5 mg via ORAL
  Filled 2015-12-07 (×3): qty 1

## 2015-12-07 NOTE — Progress Notes (Signed)
PT Cancellation Note  Patient Details Name: Luis [name] MRN: [id number] DOB: 08/25/1951   Cancelled Treatment:    Reason Eval/Treat Not Completed: Patient declined, no reason specified.  Pt denied PT at this time due to wanting to eat breakfast.  PT will be re-attempted at a later date and time.  [name], SPT [name] 12/07/2015, 9:29 AM

## 2015-12-07 NOTE — Progress Notes (Signed)
Pt is requesting something for a headache. Dr Nemiah Commanderkalisetti ordered tramadol

## 2015-12-07 NOTE — Care Management Note (Signed)
Case Management Note  Patient Details  Name: Leanna SatoMichael Huot MRN: [id number] Date of Birth: 10/14/1950  Subjective/Objective:      [facility] with Mr [name] who was calm and lucid. Oriented x 3. Mr [name] was adamant that [place] not [facility] [place] [facility] "because they [facility] [place] out the last time." Mr [name] signed a Refusal of [facility]. A copy of the refusal form was placed in Mr Enerson's chart. Copy of Refusal of Transfer was faxed to the [facility].             Action/Plan:   Expected Discharge Date:                  Expected Discharge Plan:     In-House Referral:     Discharge planning Services  CM Consult  Post Acute Care Choice:    Choice offered to:  Patient  DME Arranged:    DME Agency:     HH Arranged:    HH Agency:     Status of Service:  Completed, signed off  Medicare Important Message Given:  Yes Date Medicare IM Given:    Medicare IM give by:    Date Additional Medicare IM Given:    Additional Medicare Important Message give by:     If discussed at Long Length of Stay Meetings, dates discussed:    Additional Comments:  Agustin Swatek A, RN 12/07/2015, 1:32 PM

## 2015-12-07 NOTE — Progress Notes (Signed)
Patient ID: Luis [name], male   DOB: 26-Feb-1951, 65 y.o.   MRN: [id number] [facility] - [place] at [facility]   PATIENT NAME: Luis [name]    MR#:  [id number]  DATE OF BIRTH:  [birth year]  SUBJECTIVE:   - Patient more alert today. It seems like he is confused, possibly hallucinating too. -Speech is still thick and slurred but improving than yesterday. - Tremors present  REVIEW OF SYSTEMS:   Review of Systems  Constitutional: Negative for fever and chills.  Respiratory: Negative for cough, shortness of breath and wheezing.   Cardiovascular: Negative for chest pain, palpitations and leg swelling.  Gastrointestinal: Negative for nausea, vomiting, abdominal pain, diarrhea and constipation.  Genitourinary: Negative for dysuria.  Neurological: Positive for tremors and weakness. Negative for dizziness, seizures and headaches.  Psychiatric/Behavioral: The patient is nervous/anxious.   Patient sedated.  Tolerating Diet:yes Tolerating PT: pending  DRUG ALLERGIES:  No Known Allergies  VITALS:  Blood pressure 154/76, pulse 84, temperature 98.4 F (36.9 C), temperature source Oral, resp. rate 19, height 6' (1.829 m), weight 64.592 kg (142 lb 6.4 oz), SpO2 98 %.  PHYSICAL EXAMINATION:   Physical Exam  GENERAL:  65 y.o.-year-old patient lying in the bed with no acute distress. disheveled EYES: Pupils equal, round, reactive to light and accommodation. No scleral icterus. Extraocular muscles intact.  HEENT: Head atraumatic, normocephalic. Oropharynx and nasopharynx clear.  NECK:  Supple, no jugular venous distention. No thyroid enlargement, no tenderness.  LUNGS: Normal breath sounds bilaterally, no wheezing, rales, rhonchi. No use of accessory muscles of respiration.  CARDIOVASCULAR: S1, S2 normal. No murmurs, rubs, or gallops.  ABDOMEN: Soft, nontender, nondistended. Bowel sounds present. No organomegaly or mass.  EXTREMITIES: No cyanosis, clubbing or edema  b/l.    NEUROLOGIC: Cranial nerves II through XII are intact. No focal Motor or sensory deficits b/l.   PSYCHIATRIC:  Alert and oriented x 2, with occasional periods of agitation. Also occasional confusion Speech is thick and slurred SKIN: No obvious rash, lesion, or ulcer.   LABORATORY PANEL:  CBC  Recent Labs Lab 12/07/15 0340  WBC 6.3  HGB 8.2*  HCT 24.0*  PLT 239    Chemistries   Recent Labs Lab 12/03/15 1057  12/07/15 0340  NA 113*  < > 130*  K 4.1  < > 3.7  CL 80*  < > 104  CO2 21*  < > 20*  GLUCOSE 87  < > 97  BUN 18  < > 10  CREATININE 1.19  < > 0.85  CALCIUM 9.0  < > 8.1*  AST 110*  --   --   ALT 79*  --   --   ALKPHOS 227*  --   --   BILITOT 1.3*  --   --   < > = values in this interval not displayed. Cardiac Enzymes No results for input(s): TROPONINI in the last 168 hours. RADIOLOGY:  No results found. ASSESSMENT AND PLAN:   Luis [name] is a 65 y.o. male with a known history of htn,, alcohol abuse, tobacco abuse brought in because of multiple falls in the last 3 days. Patient denies any dizziness. Denies any nausea or fever or vomiting.   #1 Acute hyponatremia due beer potomania- continue  IV hydration with NS - slowly improving sodium- upto 130  now- close to his baseline, from 113 on admission -Low serum osm. Cont to monitor Improving mental status now  #2 Metabolic encephalopathy- from hyponatremia on admission Improving  2. Fall secondary to hyponatremia: Physical therapy consulted.  #3 COPD with continued tobacco abuse: Continue prn nebs and Spiriva. Advised to quit smoking. On nicotine patch  4.htn- on metoprolol  5. DVT Prophylaxis: lovenox - on hold now  6,. Alcohol abuse- started on CIWA protocol. Cont ativan. Psych consult if remains alert tomorrow for alc rehab  7. Anemia- drop since admission due to dilutional. Monitor, no active bleeding at this time.  hold lovenox now - normal iron panel, B12 is within normal limits as  well  Case discussed with Care Management/Social Worker. Management plans discussed with the patient, family and they are in agreement.  CODE STATUS: full   TOTAL TIME TAKING CARE OF THIS PATIENT: 30 minutes.  >50% time spent on counselling and coordination of care  POSSIBLE D/C IN 1-2 DAYS, DEPENDING ON CLINICAL CONDITION.  Note: This dictation was prepared with Dragon dictation along with smaller phrase technology. Any transcriptional errors that result from this process are unintentional.  Marchele Decock M.D on 12/07/2015 at 1:02 PM  Between 7am to 6pm - Pager - [phone]  After 6pm go to [email or web address] - password [email or web address] [facility]  [place] [place] [facility]  Office  [phone]  CC: Primary care physician; No PCP Per Patient

## 2015-12-07 NOTE — Clinical Social Work Note (Signed)
Clinical Social Work Assessment  Patient Details  Name: Luis [name] MRN: [id number] Date of Birth: [birth year]  Date of referral:  12/07/15               Reason for consult:  Substance Use/ETOH Abuse, Family Concerns, Facility Placement                Permission sought to share information with:  Chartered certified accountant granted to share information::  No  Name::        Agency::     Relationship::     Contact Information:     Housing/Transportation Living arrangements for the past 2 months:  Single Family Home Source of Information:  Patient Patient Interpreter Needed:  None Criminal Activity/Legal Involvement Pertinent to Current Situation/Hospitalization:  No - Comment as needed Significant Relationships:  Adult Children, Siblings, Other(Comment) (Ex-Wife Otila Kluver ) Lives with:  Self Do you feel safe going back to the place where you live?  Yes Need for family participation in patient care:  No (Coment)  Care giving concerns:  Patient lives alone in [place].    Social Worker assessment / plan:  Holiday representative (CSW) received verbal consult from RN Case Manager that patient may need substance abuse resources. CSW noted in chart that PT is recommending SNF. CSW met with patient to discuss D/C plan. Patient was sitting up in the chair and was oriented to self. CSW introduced self and explained role of CSW department. Patient reported that he lives on [address] in [place] alone. Per patient his daughter Levada Dy lives in [place]. CSW explained that PT is recommending SNF. CSW explained SNF process and that his [employer] will have to approve SNF. Patient adamantly refused SNF and reported that when he leaves the hospital he is going home. CSW offered patient substance abuse recourses. Patient refused substance abuse resources. Patient continued to refuse SNF and substance abuse resources throughout assessment. CSW will continue to follow and assist as  needed.    Employment status:  Retired Nurse, adult PT Recommendations:  [place] / Referral to community resources:  Other (Comment Required) (Patient refused services )  Patient/Family's Response to care:  Patient refused SNF and substance abuse resources.   Patient/Family's Understanding of and Emotional Response to Diagnosis, Current Treatment, and Prognosis:  Patient is in DT's and is not alert and oriented X3.   Emotional Assessment Appearance:    Attitude/Demeanor/Rapport:  Lethargic Affect (typically observed):  Agitated Orientation:  Oriented to Self, Oriented to Place, Oriented to  Time Alcohol / Substance use:  Alcohol Use Psych involvement (Current and /or in the community):  Yes (Comment) (Psych consult pending. )  Discharge Needs  Concerns to be addressed:  Patient refuses services Readmission within the last 30 days:    Current discharge risk:  Chronically ill, Dependent with Mobility Barriers to Discharge:  Continued Medical Work up   [name] 12/07/2015, 3:40 PM

## 2015-12-07 NOTE — Consult Note (Signed)
[facility] Face-to-Face Psychiatry Consult   Reason for Consult:  Consult for this 65 year old man currently in the hospital recovering from severe hyponatremia related to alcohol abuse. Consult for alcohol abuse. Referring Physician:  Tressia Miners Patient Identification: Luis [name] MRN:  [id number] Principal Diagnosis: Delirium tremens [facility]) Diagnosis:   Patient Active Problem List   Diagnosis Date Noted  . Alcohol abuse [F10.10] 12/07/2015  . Delirium tremens ([place]) [F10.231] 12/07/2015  . Malnutrition of moderate degree [E44.0] 12/04/2015  . Hyponatremia [E87.1] 06/18/2015    Total Time spent with patient: 1 hour  Subjective:   Luis [name] is a 65 y.o. male patient admitted with "why are you here at 3:30 in the morning".  HPI:  Patient interviewed. Chart reviewed. Labs and vitals reviewed. Also talked with the nursing staff on duty and social work covering the case. 65 year old man who is in the hospital recovering from severe hyponatremia. Was brought into the hospital with a sodium of 113. Was very confused at the time of presentation. He was referred here apparently from his apartment complex because of his repeated falls. Patient has been described as confused for most of the time he's been in the hospital since then which is about 4 days now. On interview today the patient does know that he is in [facility] and he knows the correct year. He knows that he is here because he has been having a lot of falls. He admits that he has been drinking heavily. He says he usually drinks between 12 and 18 beers per day. Denies that he is abusing any other drugs. Says that his mood currently is feeling fine. Denies depression. Completely denies any suicidal or homicidal ideation. He admits that he is having visual hallucinations and has been seeing devils in his room today. Vital signs still show elevated blood pressure but pulse is stable. He didn't have a visual tremor that I can see. Patient was  intermittently but not completely confused during the interview. He was able to repeat 3 words and actually remember 2 of them after a couple minutes but at the same time he tried to make a telephone call using the TV remote in his room and refused to believe that it was not the middle of the night even though it was bright light outside. Apparently patient was offered transfer to the [facility] but has refused.  Medical history: History of high blood pressure. Denies other medical problems except for having a lazy left eye. Patient presented here with severe hyponatremia. He says he's never however had a seizure from alcohol withdrawal.  Social history: Lives by himself in an apartment. Disabled. Patient is a veteran but is not service connected. He has 2 adult children who have been in some contact with the treatment team here and also a wife who is apparently still his legal wife but has not lived with him for some time who has made some contact here.  Substance abuse history: Patient states that he has had up to 9 months of sobriety in the past and he achieve this by staying in "a [place] place". Denies having had seizures. He denies having had complex withdrawal like this in the past. He denies any abuse of any other drugs.  Past Psychiatric History: Patient denies ever having been treated for any kind of mental health problems in the past. Denies any history of suicidal behavior denies any history of violence. Denies psychiatric hospitalization.  Risk to Self: Is patient at risk for suicide?: No  Risk to Others:   Prior Inpatient Therapy:   Prior Outpatient Therapy:    Past Medical History:  Past Medical History  Diagnosis Date  . GERD (gastroesophageal reflux disease)   . Alcohol abuse     Past Surgical History  Procedure Laterality Date  . Cholecystectomy     Family History:  Family History  Problem Relation Age of Onset  . Alcohol abuse Mother   . Alcohol abuse Father     Family Psychiatric  History: Patient says there is a family history of alcohol abuse denies any other mental health history in the family Social History:  History  Alcohol Use  . 36.0 oz/week  . 60 Cans of beer per week     History  Drug Use No    Social History   Social History  . Marital Status: Legally Separated    Spouse Name: N/A  . Number of Children: N/A  . Years of Education: N/A   Social History Main Topics  . Smoking status: Current Every Day Smoker -- 0.50 packs/day  . Smokeless tobacco: None  . Alcohol Use: 36.0 oz/week    60 Cans of beer per week  . Drug Use: No  . Sexual Activity: Not Asked   Other Topics Concern  . None   Social History Narrative   Additional Social History:    Allergies:  No Known Allergies  Labs:  Results for orders placed or performed during the hospital encounter of 12/03/15 (from the past 48 hour(s))  CBC     Status: Abnormal   Collection Time: 12/06/15  3:37 AM  Result Value Ref Range   WBC 7.0 3.8 - 10.6 K/uL   RBC 2.67 (L) 4.40 - 5.90 MIL/uL   Hemoglobin 8.5 (L) 13.0 - 18.0 g/dL   HCT 24.5 (L) 40.0 - 52.0 %   MCV 91.9 80.0 - 100.0 fL   MCH 31.8 26.0 - 34.0 pg   MCHC 34.6 32.0 - 36.0 g/dL   RDW 14.5 11.5 - 14.5 %   Platelets 211 150 - 440 K/uL  Basic metabolic panel     Status: Abnormal   Collection Time: 12/06/15  3:37 AM  Result Value Ref Range   Sodium 128 (L) 135 - 145 mmol/L   Potassium 3.7 3.5 - 5.1 mmol/L   Chloride 102 101 - 111 mmol/L   CO2 20 (L) 22 - 32 mmol/L   Glucose, Bld 87 65 - 99 mg/dL   BUN 10 6 - 20 mg/dL   Creatinine, Ser 0.90 0.61 - 1.24 mg/dL   Calcium 7.7 (L) 8.9 - 10.3 mg/dL   GFR calc non Af Amer >60 >60 mL/min   GFR calc Af Amer >60 >60 mL/min    Comment: (NOTE) The eGFR has been calculated using the CKD EPI equation. This calculation has not been validated in all clinical situations. eGFR's persistently <60 mL/min signify possible Chronic Kidney Disease.    Anion gap 6 5 - 15   Vitamin B12     Status: Abnormal   Collection Time: 12/06/15  3:37 AM  Result Value Ref Range   Vitamin B-12 978 (H) 180 - 914 pg/mL    Comment: (NOTE) This assay is not validated for testing neonatal or myeloproliferative syndrome specimens for Vitamin B12 levels. Performed at [facility]   Folate     Status: None   Collection Time: 12/06/15  3:37 AM  Result Value Ref Range   Folate 12.9 >5.9 ng/mL  Iron and TIBC  Status: Abnormal   Collection Time: 12/06/15  3:37 AM  Result Value Ref Range   Iron 54 45 - 182 ug/dL   TIBC 188 (L) 250 - 450 ug/dL   Saturation Ratios 29 17.9 - 39.5 %   UIBC 134 ug/dL  Ferritin     Status: Abnormal   Collection Time: 12/06/15  3:37 AM  Result Value Ref Range   Ferritin 386 (H) 24 - 336 ng/mL  Reticulocytes     Status: Abnormal   Collection Time: 12/06/15  3:37 AM  Result Value Ref Range   Retic Ct Pct 0.9 0.4 - 3.1 %   RBC. 2.65 (L) 4.40 - 5.90 MIL/uL   Retic Count, Manual 23.9 19.0 - 183.0 K/uL  CBC     Status: Abnormal   Collection Time: 12/07/15  3:40 AM  Result Value Ref Range   WBC 6.3 3.8 - 10.6 K/uL   RBC 2.63 (L) 4.40 - 5.90 MIL/uL   Hemoglobin 8.2 (L) 13.0 - 18.0 g/dL   HCT 24.0 (L) 40.0 - 52.0 %   MCV 91.3 80.0 - 100.0 fL   MCH 31.3 26.0 - 34.0 pg   MCHC 34.2 32.0 - 36.0 g/dL   RDW 14.6 (H) 11.5 - 14.5 %   Platelets 239 150 - 440 K/uL  Basic metabolic panel     Status: Abnormal   Collection Time: 12/07/15  3:40 AM  Result Value Ref Range   Sodium 130 (L) 135 - 145 mmol/L   Potassium 3.7 3.5 - 5.1 mmol/L   Chloride 104 101 - 111 mmol/L   CO2 20 (L) 22 - 32 mmol/L   Glucose, Bld 97 65 - 99 mg/dL   BUN 10 6 - 20 mg/dL   Creatinine, Ser 0.85 0.61 - 1.24 mg/dL   Calcium 8.1 (L) 8.9 - 10.3 mg/dL   GFR calc non Af Amer >60 >60 mL/min   GFR calc Af Amer >60 >60 mL/min    Comment: (NOTE) The eGFR has been calculated using the CKD EPI equation. This calculation has not been validated in all clinical  situations. eGFR's persistently <60 mL/min signify possible Chronic Kidney Disease.    Anion gap 6 5 - 15    Current Facility-Administered Medications  Medication Dose Route Frequency Provider Last Rate Last Dose  . 0.9 %  sodium chloride infusion   Intravenous Continuous Epifanio Lesches, MD 75 mL/hr at 12/07/15 1419    . alum & mag hydroxide-simeth (MAALOX/MYLANTA) 200-200-20 MG/5ML suspension 30 mL  30 mL Oral Q6H PRN Epifanio Lesches, MD   30 mL at 12/04/15 1801  . amLODipine (NORVASC) tablet 5 mg  5 mg Oral Daily Gladstone Lighter, MD   5 mg at 12/07/15 1332  . colchicine tablet 0.6 mg  0.6 mg Oral Daily Epifanio Lesches, MD   0.6 mg at 12/07/15 0755  . docusate sodium (COLACE) capsule 100 mg  100 mg Oral BID Gladstone Lighter, MD   100 mg at 12/07/15 0755  . feeding supplement (ENSURE ENLIVE) (ENSURE ENLIVE) liquid 237 mL  237 mL Oral BID BM Fritzi Mandes, MD   237 mL at 12/07/15 1544  . folic acid (FOLVITE) tablet 1 mg  1 mg Oral Daily Lance Coon, MD   1 mg at 12/07/15 0755  . ipratropium-albuterol (DUONEB) 0.5-2.5 (3) MG/3ML nebulizer solution 3 mL  3 mL Inhalation Q6H PRN Epifanio Lesches, MD      . LORazepam (ATIVAN) tablet 1 mg  1 mg Oral Q6H PRN Lance Coon, MD  Or  . LORazepam (ATIVAN) injection 1 mg  1 mg Intravenous Q6H PRN Lance Coon, MD   1 mg at 12/06/15 2359  . LORazepam (ATIVAN) tablet 0-4 mg  0-4 mg Oral Q12H Lance Coon, MD   2 mg at 12/06/15 2051  . metoprolol tartrate (LOPRESSOR) tablet 25 mg  25 mg Oral BID Epifanio Lesches, MD   25 mg at 12/07/15 0755  . multivitamin with minerals tablet 1 tablet  1 tablet Oral Daily Lance Coon, MD   1 tablet at 12/07/15 0755  . nicotine (NICODERM CQ - dosed in mg/24 hours) patch 14 mg  14 mg Transdermal Daily Fritzi Mandes, MD   14 mg at 12/07/15 0753  . ondansetron (ZOFRAN) tablet 4 mg  4 mg Oral Q6H PRN Epifanio Lesches, MD       Or  . ondansetron (ZOFRAN) injection 4 mg  4 mg Intravenous Q6H PRN  Epifanio Lesches, MD      . pantoprazole (PROTONIX) EC tablet 40 mg  40 mg Oral Daily Fritzi Mandes, MD   40 mg at 12/07/15 0754  . polyethylene glycol (MIRALAX / GLYCOLAX) packet 17 g  17 g Oral Daily PRN Gladstone Lighter, MD      . tamsulosin (FLOMAX) capsule 0.4 mg  0.4 mg Oral Daily Gladstone Lighter, MD   0.4 mg at 12/07/15 0757  . thiamine (VITAMIN B-1) tablet 100 mg  100 mg Oral Daily Lance Coon, MD   100 mg at 12/07/15 0755  . tiotropium (SPIRIVA) inhalation capsule 18 mcg  18 mcg Inhalation Daily Epifanio Lesches, MD   18 mcg at 12/07/15 0754    Musculoskeletal: Strength & Muscle Tone: decreased Gait & Station: unsteady Patient leans: N/A  Psychiatric Specialty Exam: Review of Systems  Constitutional: Positive for malaise/fatigue.  HENT: Negative.   Eyes: Negative.   Respiratory: Negative.   Cardiovascular: Negative.   Gastrointestinal: Negative.   Musculoskeletal: Negative.   Skin: Negative.   Neurological: Negative.   Psychiatric/Behavioral: Positive for hallucinations and substance abuse. Negative for depression, suicidal ideas and memory loss. The patient is nervous/anxious and has insomnia.     Blood pressure 151/78, pulse 74, temperature 97.7 F (36.5 C), temperature source Oral, resp. rate 16, height 6' (1.829 m), weight 64.592 kg (142 lb 6.4 oz), SpO2 100 %.Body mass index is 19.31 kg/(m^2).  General Appearance: Disheveled  Eye Contact::  Minimal  Speech:  Garbled, Slow and Slurred  Volume:  Decreased  Mood:  Euthymic  Affect:  Inappropriate  Thought Process:  Disorganized  Orientation:  Other:  As I mentioned he is oriented to where he is in the year and even the month but he was confused as to whether it was day or night.  Thought Content:  Hallucinations: Visual and Confusion  Suicidal Thoughts:  No  Homicidal Thoughts:  No  Memory:  Immediate;   Fair Recent;   Fair Remote;   Fair  Judgement:  Impaired  Insight:  Shallow  Psychomotor Activity:   Decreased  Concentration:  Poor  Recall:  [employer] of Knowledge:Fair  Language: Fair  Akathisia:  No  Handed:  Right  AIMS (if indicated):     Assets:  Desire for Improvement Housing Social Support  ADL's:  Impaired  Cognition: Impaired,  Mild  Sleep:      Treatment Plan Summary: Plan 65 year old man with a history of alcohol abuse who presented with hyponatremia and has been confused for several days. His confusion appears to be a little bit  better but on interview today he continues to have hallucinations intermittent waxing and waning mental state and delirious behavior mixed in with some periods of lucidity. Currently mental status too unstable for him to make any decisions about further treatment but he is at least telling me that he would like to stop drinking. I would not recommend that we had any medication is not really agitated in the vital signs are stable. It is not necessarily indicated to add any antipsychotic or benzodiazepine for this. He appears to be getting better on his own. I will follow-up daily. Patient certainly does need to stop drinking in the longer term. No indication to'transfer to the psychiatric ward. The question remains as to how much function he is going to ultimately recover. If he remains confused and having hallucinations even long-term after recovering from the acute alcohol withdrawal he may require placement   Disposition: Patient does not meet criteria for psychiatric inpatient admission. Supportive therapy provided about ongoing stressors.  Alethia Berthold, MD 12/07/2015 3:44 PM

## 2015-12-07 NOTE — Evaluation (Signed)
Physical Therapy Evaluation Patient Details Name: Luis [name] MRN: [id number] DOB: 06-21-1951 Today's Date: 12/07/2015   History of Present Illness  Pt is a 65 y.o. male with PMH of HTN, alcohol abuse and tobacco abuse.  Pt presented with multiple falls recently and weakness. Pt was admitted for acute hyponatremia due to beer potomania.      Clinical Impression  Prior to admission pt was independent however pt reported he has had three falls recently.  Pt lives in an independent living facility.  Pt was CGA to min assist (LE navigation) for bed mobility.  Pt was CGA for sit to stand with RW.  Pt attempted marching in place with RW and could not perform the exercise.  Pt required increased time, VC's and tactile cues for hand placement during sit to stand.  Due to aforementioned function and strength deficits, pt is in need of skilled physical therapy.  It is recommended that pt is discharged to SNF (pending progress) when medically appropriate.     Follow Up Recommendations SNF (Pending [facility])    Equipment Recommendations  Rolling walker with 5" wheels    Recommendations for Other Services       Precautions / Restrictions Precautions Precautions: Fall Restrictions Weight Bearing Restrictions: No      Mobility  Bed Mobility Overal bed mobility: Needs Assistance Bed Mobility: Rolling;Sidelying to Sit;Sit to Sidelying Rolling: Supervision Sidelying to sit: Min guard     Sit to sidelying: Min assist (Le navigation)    Transfers Overall transfer level: Needs assistance Equipment used: Rolling walker (2 wheeled) Transfers: Sit to/from Stand Sit to Stand: Min guard         General transfer comment: increased time VC's and tactile cues for hand placement   Ambulation/Gait             General Gait Details: Could not march in place or ambulate with RW   Stairs            Wheelchair Mobility    Modified Rankin (Stroke Patients Only)       Balance  Overall balance assessment: Needs assistance Sitting-balance support: Feet supported Sitting balance-Leahy Scale: Good     Standing balance support: Bilateral upper extremity supported (RW) Standing balance-Leahy Scale: Fair                               Pertinent Vitals/Pain Pain Assessment: No/denies pain  See flow sheet for vitals.     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    Prior Function Level of Independence: Independent         Comments: Pt recently has had multiple falls      Hand Dominance        Extremity/Trunk Assessment   Upper Extremity Assessment: Overall WFL for tasks assessed           Lower Extremity Assessment: Generalized weakness      Cervical / Trunk Assessment: Normal  Communication   Communication: No difficulties;HOH  Cognition Arousal/Alertness: Awake/alert Behavior During Therapy: Agitated;Impulsive Overall Cognitive Status: No family/caregiver present to determine baseline cognitive functioning  General Comments   Nursing was contacted and cleared pt for physical therapy.  Pt was agitated and impulsive and session was modified due to weakness.       Exercises        Assessment/Plan    PT Assessment Patient needs continued PT services  PT Diagnosis Difficulty walking   PT Problem List Decreased strength;Decreased range of motion;Decreased activity tolerance;Decreased balance;Decreased mobility;Pain;Decreased knowledge of use of DME  PT Treatment Interventions DME instruction;Gait training;Stair training;Functional mobility training;Therapeutic activities;Therapeutic  exercise;Balance training;Patient/family education   PT Goals (Current goals can be found in the Care Plan section) Acute Rehab PT Goals Patient Stated Goal: to go home  PT Goal Formulation: With patient Time For Goal Achievement: 12/21/15 Potential to Achieve Goals: Good    Frequency Min 2X/week   Barriers to discharge        Co-evaluation               End of Session Equipment Utilized During Treatment: Gait belt Activity Tolerance: Patient limited by lethargy;Other (comment) (Pt limited by LE weakness upon standing ) Patient left: in bed;with call bell/phone within reach;with bed alarm set;with SCD's reapplied (B mats on both sides of the bed) Nurse Communication: Mobility status         Time: [id number] PT Time Calculation (min) (ACUTE ONLY): 32 min   Charges:         PT G Codes:       Luis [name], SPT Luis [name] 12/07/2015, 12:30 PM

## 2015-12-08 LAB — BASIC METABOLIC PANEL
ANION GAP: 5 (ref 5–15)
BUN: 10 mg/dL (ref 6–20)
CALCIUM: 7.7 mg/dL — AB (ref 8.9–10.3)
CHLORIDE: 102 mmol/L (ref 101–111)
CO2: 22 mmol/L (ref 22–32)
Creatinine, Ser: 0.92 mg/dL (ref 0.61–1.24)
GFR calc Af Amer: >60 mL/min (ref >60)
GFR calc non Af Amer: >60 mL/min (ref >60)
GLUCOSE: 122 mg/dL — AB (ref 65–99)
Potassium: 3.3 mmol/L — ABNORMAL LOW (ref 3.5–5.1)
Sodium: 129 mmol/L — ABNORMAL LOW (ref 135–145)

## 2015-12-08 MED ORDER — POTASSIUM CHLORIDE CRYS ER 20 MEQ PO TBCR
40.0000 meq | EXTENDED_RELEASE_TABLET | Freq: Once | ORAL | Status: AC
  Administered 2015-12-08: 40 meq via ORAL
  Filled 2015-12-08: qty 2

## 2015-12-08 MED ORDER — ACETAMINOPHEN 325 MG PO TABS
650.0000 mg | ORAL_TABLET | Freq: Four times a day (QID) | ORAL | Status: DC | PRN
  Administered 2015-12-08: 650 mg via ORAL
  Filled 2015-12-08: qty 2

## 2015-12-08 NOTE — Care Management Important Message (Signed)
Important Message  Patient Details  Name: Luis SatoMichael Bocanegra MRN: [id number] Date of Birth: 02/05/1951   Medicare Important Message Given:  Yes    Eleana Tocco A, RN 12/08/2015, 9:10 AM

## 2015-12-08 NOTE — Care Management Note (Signed)
Case Management Note  Patient Details  Name: Luis [name] MRN: [id number] Date of Birth: 07/24/1951  Subjective/Objective:       64yo Mr Luis [name] remains on CIWA and has episodes of confusion. He has a history of frequent falls at his apartment, and prior to this admission reports that he was drinking 8 to 12 beers daily. Mr Luis [name] was [facility] and his signed refusal of [facility] the [facility] was [facility] to the [facility] and a copy is in his hospital chart. Mr Luis [name] is refusing offers of PT rehab as recommended by [facility]. He also refused a referral to long term substance abuse rehab. Mr Luis [name] is currently reporting that he wants to be discharged back to his home. Case management will follow for discharge planning.               Action/Plan:   Expected Discharge Date:                  Expected Discharge Plan:     In-House Referral:     Discharge planning Services  CM Consult  Post Acute Care Choice:    Choice offered to:  Patient  DME Arranged:    DME Agency:     HH Arranged:    HH Agency:     Status of Service:  Completed, signed off  Medicare Important Message Given:  Yes Date Medicare IM Given:    Medicare IM give by:    Date Additional Medicare IM Given:    Additional Medicare Important Message give by:     If discussed at Long Length of Stay Meetings, dates discussed:    Additional Comments:  Valeda Corzine A, RN 12/08/2015, 11:46 AM

## 2015-12-08 NOTE — Consult Note (Signed)
[facility] Face-to-Face Psychiatry Consult   Reason for Consult:  Consult for this 65 year old man currently in the hospital recovering from severe hyponatremia related to alcohol abuse. Consult for alcohol abuse. Referring Physician:  Tressia Miners Patient Identification: Luis [name] MRN:  [id number] Principal Diagnosis: Delirium tremens [facility]) Diagnosis:   Patient Active Problem List   Diagnosis Date Noted  . Alcohol abuse [F10.10] 12/07/2015  . Delirium tremens ([place]) [F10.231] 12/07/2015  . Malnutrition of moderate degree [E44.0] 12/04/2015  . Hyponatremia [E87.1] 06/18/2015    Total Time spent with patient: 1 hour  Subjective:   Luis [name] is a 65 y.o. male  Update as of Tuesday the 14th. Patient interviewed. Chart reviewed. Blood pressure still up but pulse stable. It doesn't look like he has required any sedatives during the day today. On interview today I found him alert and oriented. He knew the correct date and year and knew where he was and understood his situation. He did not appear to be confused or delirious. Mood is okay. No suicidal thoughts no evidence of hallucinations. Patient is saying that he wants to go to "rehabilitation". Doesn't seem to have a clear idea of what that would mean but he does say that he is interested in stopping drinking.  HPI:  Patient interviewed. Chart reviewed. Labs and vitals reviewed. Also talked with the nursing staff on duty and social work covering the case. 65 year old man who is in the hospital recovering from severe hyponatremia. Was brought into the hospital with a sodium of 113. Was very confused at the time of presentation. He was referred here apparently from his apartment complex because of his repeated falls. Patient has been described as confused for most of the time he's been in the hospital since then which is about 4 days now. On interview today the patient does know that he is in [facility] and he knows the correct year. He knows that  he is here because he has been having a lot of falls. He admits that he has been drinking heavily. He says he usually drinks between 12 and 18 beers per day. Denies that he is abusing any other drugs. Says that his mood currently is feeling fine. Denies depression. Completely denies any suicidal or homicidal ideation. He admits that he is having visual hallucinations and has been seeing devils in his room today. Vital signs still show elevated blood pressure but pulse is stable. He didn't have a visual tremor that I can see. Patient was intermittently but not completely confused during the interview. He was able to repeat 3 words and actually remember 2 of them after a couple minutes but at the same time he tried to make a telephone call using the TV remote in his room and refused to believe that it was not the middle of the night even though it was bright light outside. Apparently patient was offered transfer to the [facility] but has refused.  Medical history: History of high blood pressure. Denies other medical problems except for having a lazy left eye. Patient presented here with severe hyponatremia. He says he's never however had a seizure from alcohol withdrawal.  Social history: Lives by himself in an apartment. Disabled. Patient is a veteran but is not service connected. He has 2 adult children who have been in some contact with the treatment team here and also a wife who is apparently still his legal wife but has not lived with him for some time who has made some contact here.  Substance abuse history: Patient states that he has had up to 9 months of sobriety in the past and he achieve this by staying in "a [place] place". Denies having had seizures. He denies having had complex withdrawal like this in the past. He denies any abuse of any other drugs.  Past Psychiatric History: Patient denies ever having been treated for any kind of mental health problems in the past. Denies any history of  suicidal behavior denies any history of violence. Denies psychiatric hospitalization.  Risk to Self: Is patient at risk for suicide?: No Risk to Others:   Prior Inpatient Therapy:   Prior Outpatient Therapy:    Past Medical History:  Past Medical History  Diagnosis Date  . GERD (gastroesophageal reflux disease)   . Alcohol abuse     Past Surgical History  Procedure Laterality Date  . Cholecystectomy     Family History:  Family History  Problem Relation Age of Onset  . Alcohol abuse Mother   . Alcohol abuse Father    Family Psychiatric  History: Patient says there is a family history of alcohol abuse denies any other mental health history in the family Social History:  History  Alcohol Use  . 36.0 oz/week  . 60 Cans of beer per week     History  Drug Use No    Social History   Social History  . Marital Status: Legally Separated    Spouse Name: N/A  . Number of Children: N/A  . Years of Education: N/A   Social History Main Topics  . Smoking status: Current Every Day Smoker -- 0.50 packs/day  . Smokeless tobacco: None  . Alcohol Use: 36.0 oz/week    60 Cans of beer per week  . Drug Use: No  . Sexual Activity: Not Asked   Other Topics Concern  . None   Social History Narrative   Additional Social History:    Allergies:  No Known Allergies  Labs:  Results for orders placed or performed during the hospital encounter of 12/03/15 (from the past 48 hour(s))  CBC     Status: Abnormal   Collection Time: 12/07/15  3:40 AM  Result Value Ref Range   WBC 6.3 3.8 - 10.6 K/uL   RBC 2.63 (L) 4.40 - 5.90 MIL/uL   Hemoglobin 8.2 (L) 13.0 - 18.0 g/dL   HCT 24.0 (L) 40.0 - 52.0 %   MCV 91.3 80.0 - 100.0 fL   MCH 31.3 26.0 - 34.0 pg   MCHC 34.2 32.0 - 36.0 g/dL   RDW 14.6 (H) 11.5 - 14.5 %   Platelets 239 150 - 440 K/uL  Basic metabolic panel     Status: Abnormal   Collection Time: 12/07/15  3:40 AM  Result Value Ref Range   Sodium 130 (L) 135 - 145 mmol/L    Potassium 3.7 3.5 - 5.1 mmol/L   Chloride 104 101 - 111 mmol/L   CO2 20 (L) 22 - 32 mmol/L   Glucose, Bld 97 65 - 99 mg/dL   BUN 10 6 - 20 mg/dL   Creatinine, Ser 0.85 0.61 - 1.24 mg/dL   Calcium 8.1 (L) 8.9 - 10.3 mg/dL   GFR calc non Af Amer >60 >60 mL/min   GFR calc Af Amer >60 >60 mL/min    Comment: (NOTE) The eGFR has been calculated using the CKD EPI equation. This calculation has not been validated in all clinical situations. eGFR's persistently <60 mL/min signify possible Chronic Kidney Disease.  Anion gap 6 5 - 15  Basic metabolic panel     Status: Abnormal   Collection Time: 12/08/15  3:58 AM  Result Value Ref Range   Sodium 129 (L) 135 - 145 mmol/L   Potassium 3.3 (L) 3.5 - 5.1 mmol/L   Chloride 102 101 - 111 mmol/L   CO2 22 22 - 32 mmol/L   Glucose, Bld 122 (H) 65 - 99 mg/dL   BUN 10 6 - 20 mg/dL   Creatinine, Ser 0.92 0.61 - 1.24 mg/dL   Calcium 7.7 (L) 8.9 - 10.3 mg/dL   GFR calc non Af Amer >60 >60 mL/min   GFR calc Af Amer >60 >60 mL/min    Comment: (NOTE) The eGFR has been calculated using the CKD EPI equation. This calculation has not been validated in all clinical situations. eGFR's persistently <60 mL/min signify possible Chronic Kidney Disease.    Anion gap 5 5 - 15    Current Facility-Administered Medications  Medication Dose Route Frequency Provider Last Rate Last Dose  . acetaminophen (TYLENOL) tablet 650 mg  650 mg Oral Q6H PRN Gladstone Lighter, MD   650 mg at 12/08/15 1213  . alum & mag hydroxide-simeth (MAALOX/MYLANTA) 200-200-20 MG/5ML suspension 30 mL  30 mL Oral Q6H PRN Epifanio Lesches, MD   30 mL at 12/04/15 1801  . amLODipine (NORVASC) tablet 5 mg  5 mg Oral Daily Gladstone Lighter, MD   5 mg at 12/08/15 0900  . docusate sodium (COLACE) capsule 100 mg  100 mg Oral BID Gladstone Lighter, MD   100 mg at 12/08/15 0900  . feeding supplement (ENSURE ENLIVE) (ENSURE ENLIVE) liquid 237 mL  237 mL Oral BID BM Fritzi Mandes, MD   237 mL at  12/08/15 1640  . folic acid (FOLVITE) tablet 1 mg  1 mg Oral Daily Lance Coon, MD   1 mg at 12/08/15 0900  . ipratropium-albuterol (DUONEB) 0.5-2.5 (3) MG/3ML nebulizer solution 3 mL  3 mL Inhalation Q6H PRN Epifanio Lesches, MD      . LORazepam (ATIVAN) tablet 0-4 mg  0-4 mg Oral Q12H Lance Coon, MD   2 mg at 12/07/15 2131  . metoprolol tartrate (LOPRESSOR) tablet 25 mg  25 mg Oral BID Epifanio Lesches, MD   25 mg at 12/08/15 0900  . multivitamin with minerals tablet 1 tablet  1 tablet Oral Daily Lance Coon, MD   1 tablet at 12/08/15 0900  . nicotine (NICODERM CQ - dosed in mg/24 hours) patch 14 mg  14 mg Transdermal Daily Fritzi Mandes, MD   14 mg at 12/08/15 0900  . ondansetron (ZOFRAN) tablet 4 mg  4 mg Oral Q6H PRN Epifanio Lesches, MD       Or  . ondansetron (ZOFRAN) injection 4 mg  4 mg Intravenous Q6H PRN Epifanio Lesches, MD      . pantoprazole (PROTONIX) EC tablet 40 mg  40 mg Oral Daily Fritzi Mandes, MD   40 mg at 12/08/15 0900  . polyethylene glycol (MIRALAX / GLYCOLAX) packet 17 g  17 g Oral Daily PRN Gladstone Lighter, MD      . tamsulosin (FLOMAX) capsule 0.4 mg  0.4 mg Oral Daily Gladstone Lighter, MD   0.4 mg at 12/08/15 0900  . thiamine (VITAMIN B-1) tablet 100 mg  100 mg Oral Daily Lance Coon, MD   100 mg at 12/08/15 0900  . tiotropium (SPIRIVA) inhalation capsule 18 mcg  18 mcg Inhalation Daily Epifanio Lesches, MD   18 mcg at 12/08/15  2763  . traMADol (ULTRAM) tablet 50 mg  50 mg Oral Q6H PRN Gladstone Lighter, MD   50 mg at 12/07/15 1745    Musculoskeletal: Strength & Muscle Tone: decreased Gait & Station: unsteady Patient leans: N/A  Psychiatric Specialty Exam: Review of Systems  Constitutional: Positive for malaise/fatigue.  HENT: Negative.   Eyes: Negative.   Respiratory: Negative.   Cardiovascular: Negative.   Gastrointestinal: Negative.   Musculoskeletal: Negative.   Skin: Negative.   Neurological: Negative.   Psychiatric/Behavioral:  Positive for substance abuse. Negative for depression, suicidal ideas, hallucinations and memory loss. The patient is not nervous/anxious and does not have insomnia.     Blood pressure 168/94, pulse 76, temperature 97.6 F (36.4 C), temperature source Oral, resp. rate 18, height 6' (1.829 m), weight 64.592 kg (142 lb 6.4 oz), SpO2 100 %.Body mass index is 19.31 kg/(m^2).  General Appearance: Disheveled  Eye Sport and exercise psychologist::  Fair  Speech:  Clear and Coherent  Volume:  Decreased  Mood:  Euthymic  Affect:  Congruent  Thought Process:  Goal Directed  Orientation:  Full (Time, Place, and Person)  Thought Content:  Negative  Suicidal Thoughts:  No  Homicidal Thoughts:  No  Memory:  Immediate;   Fair Recent;   Fair Remote;   Fair  Judgement:  Impaired  Insight:  Shallow  Psychomotor Activity:  Decreased  Concentration:  Poor  Recall:  [employer] of Knowledge:Fair  Language: Fair  Akathisia:  No  Handed:  Right  AIMS (if indicated):     Assets:  Desire for Improvement Housing Social Support  ADL's:  Impaired  Cognition: WNL  Sleep:      Treatment Plan Summary: Plan 65 year old man with alcohol dependence. Appears to be coming through the worst of his withdrawal. No longer delirious today. Patient lives by himself. Does not have any current contact with substance abuse treatment. As far as substance abuse treatment outside the hospital the ideal thing for him would probably be if he could get involved [facility]. He would be potentially eligible for this. The worst part of it is that he would have to travel to get there. Inpatient rehabilitation treatment is not really an option at this point. Once he is medically stable we will refer him for outpatient treatment in the community. No indication for any psychiatric medicine. I will continue to follow-up as needed.  Disposition: Patient does not meet criteria for psychiatric inpatient admission. Supportive therapy provided about ongoing  stressors.  Alethia Berthold, MD 12/08/2015 6:53 PM

## 2015-12-08 NOTE — Progress Notes (Signed)
Patient ID: Luis [name], male   DOB: 08/04/1951, 65 y.o.   MRN: [id number] [facility] [place] [facility] [place] [facility]   PATIENT NAME: Luis [name]    MR#:  [id number]  DATE OF BIRTH:  05/08/1951  SUBJECTIVE:   - More alert, less hallucinating today - wants to try alcohol rehab- psych f/u today - PT recommended SNF  REVIEW OF SYSTEMS:   Review of Systems  Constitutional: Negative for fever and chills.  Respiratory: Negative for cough, shortness of breath and wheezing.   Cardiovascular: Negative for chest pain, palpitations and leg swelling.  Gastrointestinal: Negative for nausea, vomiting, abdominal pain, diarrhea and constipation.  Genitourinary: Negative for dysuria.  Neurological: Positive for tremors and weakness. Negative for dizziness, seizures and headaches.  Psychiatric/Behavioral: The patient is nervous/anxious.   Patient sedated.  Tolerating Diet:yes Tolerating PT: pending  DRUG ALLERGIES:  No Known Allergies  VITALS:  Blood pressure 156/84, pulse 74, temperature 97.6 F (36.4 C), temperature source Oral, resp. rate 18, height 6' (1.829 m), weight 64.592 kg (142 lb 6.4 oz), SpO2 100 %.  PHYSICAL EXAMINATION:   Physical Exam  GENERAL:  65 y.o.-year-old patient lying in the bed with no acute distress. disheveled EYES: Pupils equal, round, reactive to light and accommodation. No scleral icterus. Extraocular muscles intact.  HEENT: Head atraumatic, normocephalic. Oropharynx and nasopharynx clear.  NECK:  Supple, no jugular venous distention. No thyroid enlargement, no tenderness.  LUNGS: Normal breath sounds bilaterally, no wheezing, rales, rhonchi. No use of accessory muscles of respiration.  CARDIOVASCULAR: S1, S2 normal. No murmurs, rubs, or gallops.  ABDOMEN: Soft, nontender, nondistended. Bowel sounds present. No organomegaly or mass.  EXTREMITIES: No cyanosis, clubbing or edema b/l.    NEUROLOGIC: Cranial nerves II through XII are intact.  No focal Motor or sensory deficits b/l.   PSYCHIATRIC:  Alert and oriented x 3 Speech is much improved SKIN: No obvious rash, lesion, or ulcer.   LABORATORY PANEL:  CBC  Recent Labs Lab 12/07/15 0340  WBC 6.3  HGB 8.2*  HCT 24.0*  PLT 239    Chemistries   Recent Labs Lab 12/03/15 1057  12/08/15 0358  NA 113*  < > 129*  K 4.1  < > 3.3*  CL 80*  < > 102  CO2 21*  < > 22  GLUCOSE 87  < > 122*  BUN 18  < > 10  CREATININE 1.19  < > 0.92  CALCIUM 9.0  < > 7.7*  AST 110*  --   --   ALT 79*  --   --   ALKPHOS 227*  --   --   BILITOT 1.3*  --   --   < > = values in this interval not displayed. Cardiac Enzymes No results for input(s): TROPONINI in the last 168 hours. RADIOLOGY:  No results found. ASSESSMENT AND PLAN:   Luis [name] is a 65 y.o. male with a known history of htn,, alcohol abuse, tobacco abuse brought in because of multiple falls in the last 3 days. Patient denies any dizziness. Denies any nausea or fever or vomiting.   #1 Acute hyponatremia due beer potomania- improved and back to baseline Discontinue fluids today -Low serum osm. Cont to monitor Improving mental status now  #2 Metabolic encephalopathy- from hyponatremia on admission Improving. More alert today No hallucinations today  2. Hypokalemia- being replaced  #3 COPD with continued tobacco abuse: Continue prn nebs and Spiriva. Advised to quit smoking. On nicotine patch  4.htn-  on metoprolol  6,. Alcohol abuse- on CIWA protocol. Cont ativan. Much improving Psych consulted, patient interested in inpatient rehab  7. Anemia- drop since admission due to dilutional. Monitor, no active bleeding at this time. Stable at 8  hold lovenox - normal iron panel, B12 is within normal limits as well  Case discussed with Care Management/Social Worker. Management plans discussed with the patient, family and they are in agreement.  CODE STATUS: full code   TOTAL TIME TAKING CARE OF THIS PATIENT: 32  minutes.  >50% time spent on counselling and coordination of care  POSSIBLE D/C TOMORROW, DEPENDING ON CLINICAL CONDITION.  Note: This dictation was prepared with Dragon dictation along with smaller phrase technology. Any transcriptional errors that result from this process are unintentional.  Dontreal Miera M.D on 12/08/2015 at 1:59 PM  Between 7am to 6pm - Pager - [phone]  After 6pm go to [email or web address] - password [email or web address] [facility]  [place] [facility]  Office  [phone]  CC: Primary care physician; No PCP Per Patient

## 2015-12-08 NOTE — Progress Notes (Signed)
PT Cancellation Note  Patient Details Name: Luis SatoMichael Guinyard MRN: [id number] DOB: 10/12/1950   Cancelled Treatment:    Reason Eval/Treat Not Completed: Patient declined, no reason specified Pt encouraged to participate in PT and educated on importance of PT. Pt continued to declined. PT will be re-attempted at a later date and time.    Lyndel SafeGarrett Lawerence Dery, SPT Lyndel SafeGarrett Anita Laguna 12/08/2015, 3:51 PM

## 2015-12-08 NOTE — Progress Notes (Signed)
Pt c/o of headache. MD Kalisetti notified at the nurses station. MD Kalisetti placing orders for Tylenol PRN.

## 2015-12-08 NOTE — Progress Notes (Signed)
Clinical Education officer, museum (CSW) met with patient for a second time today. Patient was laying in bed. CSW discussed SNF for rehab. Patient adamantly refused SNF again and reported that he is going home. CSW encouraged patient to work hard with PT and get up and walk today if he plans on going home. CSW also dicussed substance abuse resources. Patient refused resources and reported that he has "a lot of churches by his house that will help him." CSW made PT, MD and RN Case Manager aware of above. CSW will continue to follow and assist as needed.   Blima Rich, LCSW [phone]

## 2015-12-09 MED ORDER — METOPROLOL TARTRATE 25 MG PO TABS
25.0000 mg | ORAL_TABLET | Freq: Two times a day (BID) | ORAL | Status: DC
Start: 2015-12-09 — End: 2015-12-09

## 2015-12-09 MED ORDER — AMLODIPINE BESYLATE 5 MG PO TABS: 5.0000 mg | ORAL_TABLET | Freq: Every day | ORAL | Status: DC

## 2015-12-09 MED ORDER — METOPROLOL TARTRATE 25 MG PO TABS: 25.0000 mg | ORAL_TABLET | Freq: Two times a day (BID) | ORAL | Status: DC

## 2015-12-09 MED ORDER — TAMSULOSIN HCL 0.4 MG PO CAPS: 0.4000 mg | ORAL_CAPSULE | Freq: Every day | ORAL | Status: AC

## 2015-12-09 MED ORDER — TRAMADOL HCL 50 MG PO TABS: 50.0000 mg | ORAL_TABLET | Freq: Four times a day (QID) | ORAL | Status: DC | PRN

## 2015-12-09 NOTE — Progress Notes (Signed)
Physical Therapy Treatment Patient Details Name: Luis SatoMichael Fiumara MRN: [id number] DOB: 03/03/1951 Today's Date: 12/09/2015    History of Present Illness Pt is a 65 y.o. male with PMH of HTN, alcohol abuse and tobacco abuse.  Pt presented with multiple falls recently and weakness. Pt was admitted for acute hyponatremia due to beer potomania.      PT Comments    Pt has no voiced complaints. Pt progressing ambulation and out of bed activity. Pt ambulates 200 ft with rolling walker without loss of balance and subjective complaints of mild unsteadiness. Pt notes he is too fatigued to attempt steps at this time; pt has 12 steps to enter home with bilateral handrails. Pt participates well in seated and stand exercises. Pt wishes back to bed; however, therapist encouraged up in chair for a couple of hours. Pt expresses concerns of transportation home as well as functioning in home in regards to cooking and cleaning and getting to the bathroom.  Discussed with MD and left note for social work regarding. Pt may benefit from in home services to address function. Continue PT at this time to address stair climbing to allow safe return home.   Follow Up Recommendations  Home health PT;Outpatient PT     Equipment Recommendations  Rolling walker with 5" wheels    Recommendations for Other Services       Precautions / Restrictions Precautions Precautions: Fall Restrictions Weight Bearing Restrictions: No    Mobility  Bed Mobility Overal bed mobility: Modified Independent             General bed mobility comments: Use of rail; no issues  Transfers Overall transfer level: Modified independent Equipment used: Rolling walker (2 wheeled) Transfers: Sit to/from Stand Sit to Stand: Modified independent (Device/Increase time);Supervision         General transfer comment: cues for hand placement; otherwise performs with Mod IS with use of rw  Ambulation/Gait Ambulation/Gait assistance: Min  guard Ambulation Distance (Feet): 200 Feet Assistive device: Rolling walker (2 wheeled) Gait Pattern/deviations: Step-through pattern   Gait velocity interpretation: Below normal speed for age/gender General Gait Details: Adjusted rw for appropriate fit with improved upright posture. Pt notes feeling mildly unsteady with fatigue at end of walk, but no overt LOB   Stairs Stairs:  (offered; pt fatigued post walk and felt unable to perform)          Wheelchair Mobility    Modified Rankin (Stroke Patients Only)       Balance Overall balance assessment: Needs assistance Sitting-balance support: Feet supported;No upper extremity supported Sitting balance-Leahy Scale: Good     Standing balance support: Bilateral upper extremity supported Standing balance-Leahy Scale: Fair                      Cognition Arousal/Alertness: Awake/alert Behavior During Therapy: WFL for tasks assessed/performed Overall Cognitive Status: Within Functional Limits for tasks assessed                      Exercises General Exercises - Lower Extremity Long Arc Quad: AROM;Both;20 reps;Seated Hip ABduction/ADduction: AROM;Both;20 reps;Standing Straight Leg Raises: AROM;Both;20 reps;Standing Hip Flexion/Marching: AROM;Both;20 reps;Standing Toe Raises: AROM;Both;20 reps;Seated Heel Raises: AROM;Both;20 reps;Seated Mini-Sqauts: Other (comment) (STS 5x)    General Comments        Pertinent Vitals/Pain Pain Assessment: No/denies pain    Home Living  Prior Function            PT Goals (current goals can now be found in the care plan section) Progress towards PT goals: Progressing toward goals    Frequency  Min 2X/week    PT Plan Discharge plan needs to be updated    Co-evaluation             End of Session Equipment Utilized During Treatment: Gait belt Activity Tolerance: Patient tolerated treatment well;Patient limited by  fatigue Patient left: in chair;with call bell/phone within reach;with chair alarm set;with SCD's reapplied     Time: 0940-1007 PT Time Calculation (min) (ACUTE ONLY): 27 min  Charges:  $Gait Training: 8-22 mins $Therapeutic Exercise: 8-22 mins                    G Codes:      Kristeen Miss, PTA 12/09/2015, 10:39 AM

## 2015-12-09 NOTE — Care Management (Addendum)
Spoke withTina -ex wife is family [phone] and she has been advised to talk to patient about becoming [facility]. Inetta Fermoina advised that patient refused outpatient ETOH treatment. Inetta Fermoina also aware that patient is being discharged and that someone will need to come pick him up. Inetta Fermoina advised that patient wants to go home. She said "only to hit that can again". She said she "does not have gas".  She said he may have money for taxi if not CSW will provide voucher for taxi ONLY if he cannot obtain transportation.

## 2015-12-09 NOTE — Discharge Summary (Signed)
[facility] [place] [facility]   PATIENT NAME: Luis SatoMichael Patin    MR#:  [id number]  DATE OF BIRTH:  06/21/1951  DATE OF ADMISSION:  12/03/2015 ADMITTING PHYSICIAN: Katha HammingSnehalatha Konidena, MD  DATE OF DISCHARGE: 12/09/15  PRIMARY CARE PHYSICIAN: No PCP Per Patient    ADMISSION DIAGNOSIS:  Hyponatremia [E87.1]  DISCHARGE DIAGNOSIS:  Principal Problem:   Delirium tremens (HCC) Active Problems:   Hyponatremia   Malnutrition of moderate degree   Alcohol abuse   SECONDARY DIAGNOSIS:   Past Medical History  Diagnosis Date  . GERD (gastroesophageal reflux disease)   . Alcohol abuse     HOSPITAL COURSE:   Luis [name] is a 65 y.o. male with a known history of htn,, alcohol abuse, tobacco abuse brought in because of multiple falls in the last 3 days. Patient denies any dizziness. Denies any nausea or fever or vomiting.   #1 Acute hyponatremia due beer potomania- improved and back to baseline -Low serum osm. Cont to monitor Improving mental status and back to baseline now  #2 Metabolic encephalopathy- from hyponatremia on admission Improving. More alert and oriented today No hallucinations anymore  2. Hypokalemia- replaced  #3 COPD with continued tobacco abuse: Continue prn nebs. Advised to quit smoking.  4.htn- on metoprolol  6,. Alcohol abuse- Not withdrawing anymore -Psych consulted, patient not qualified for inpatient rehab - Outpatient alcohol rehabilitation recommended  7. Anemia- drop since admission due to dilutional. Monitor, no active bleeding at this time. Stable at 8 - normal iron panel, B12 is within normal limits as well  The alcohol-induced bone marrow suppression.  Patient has been having falls at home. Ataxia likely due to chronic alcohol use. Worked with physical therapy and ambulated with a walker without any assist. Some unsteadiness noted. Alcohol cessation advised. Being discharged home today.  DISCHARGE CONDITIONS:    guarded  CONSULTS OBTAINED:  Treatment Team:  Audery AmelJohn T Clapacs, MD  DRUG ALLERGIES:  No Known Allergies  DISCHARGE MEDICATIONS:   Current Discharge Medication List    START taking these medications   Details  amLODipine (NORVASC) 5 MG tablet Take 1 tablet (5 mg total) by mouth daily. Qty: 30 tablet, Refills: 2    tamsulosin (FLOMAX) 0.4 MG CAPS capsule Take 1 capsule (0.4 mg total) by mouth daily. Qty: 30 capsule, Refills: 2    traMADol (ULTRAM) 50 MG tablet Take 1 tablet (50 mg total) by mouth every 6 (six) hours as needed for moderate pain (Headache). Qty: 16 tablet, Refills: 0      CONTINUE these medications which have CHANGED   Details  metoprolol tartrate (LOPRESSOR) 25 MG tablet Take 1 tablet (25 mg total) by mouth 2 (two) times daily. Qty: 60 tablet, Refills: 2      CONTINUE these medications which have NOT CHANGED   Details  esomeprazole (NEXIUM) 20 MG capsule Take 20 mg by mouth daily at 12 noon.         DISCHARGE INSTRUCTIONS:   1. PCP f/u in 2 weeks 2. ALCOHOL CESSATION  If you experience worsening of your admission symptoms, develop shortness of breath, life threatening emergency, suicidal or homicidal thoughts you must seek medical attention immediately by calling 911 or calling your MD immediately  if symptoms less severe.  You Must read complete instructions/literature along with all the possible adverse reactions/side effects for all the Medicines you take and that have been prescribed to you. Take any new Medicines after you have completely understood and accept all the  possible adverse reactions/side effects.   Please note  You were cared for by a hospitalist during your hospital stay. If you have any questions about your discharge medications or the care you received while you were in the hospital after you are discharged, you can call the unit and asked to speak with the hospitalist on call if the hospitalist that took care of you is not  available. Once you are discharged, your primary care physician will handle any further medical issues. Please note that NO REFILLS for any discharge medications will be authorized once you are discharged, as it is imperative that you return to your primary care physician (or establish a relationship with a primary care physician if you do not have one) for your aftercare needs so that they can reassess your need for medications and monitor your lab values.    Today   CHIEF COMPLAINT:   Chief Complaint  Patient presents with  . Alcohol Intoxication  . Weakness    VITAL SIGNS:  Blood pressure 143/77, pulse 93, temperature 97.9 F (36.6 C), temperature source Oral, resp. rate 16, height 6' (1.829 m), weight 64.592 kg (142 lb 6.4 oz), SpO2 96 %.  I/O:   Intake/Output Summary (Last 24 hours) at 12/09/15 1219 Last data filed at 12/09/15 0932  Gross per 24 hour  Intake    480 ml  Output   1200 ml  Net   -720 ml    PHYSICAL EXAMINATION:   Physical Exam  GENERAL: 65 y.o.-year-old patient lying in the bed with no acute distress. Disheveled More alert and oriented EYES: Pupils equal, round, reactive to light and accommodation. No scleral icterus. Extraocular muscles intact.  HEENT: Head atraumatic, normocephalic. Oropharynx and nasopharynx clear.  NECK: Supple, no jugular venous distention. No thyroid enlargement, no tenderness.  LUNGS: Normal breath sounds bilaterally, no wheezing, rales, rhonchi. No use of accessory muscles of respiration.  CARDIOVASCULAR: S1, S2 normal. No murmurs, rubs, or gallops.  ABDOMEN: Soft, nontender, nondistended. Bowel sounds present. No organomegaly or mass.  EXTREMITIES: No cyanosis, clubbing or edema b/l.  NEUROLOGIC: Cranial nerves II through XII are intact. No focal Motor or sensory deficits b/l.  PSYCHIATRIC: Alert and oriented x 3 Speech is much improved SKIN: No obvious rash, lesion, or ulcer.   DATA REVIEW:   CBC  Recent  Labs Lab 12/07/15 0340  WBC 6.3  HGB 8.2*  HCT 24.0*  PLT 239    Chemistries   Recent Labs Lab 12/03/15 1057  12/08/15 0358  NA 113*  < > 129*  K 4.1  < > 3.3*  CL 80*  < > 102  CO2 21*  < > 22  GLUCOSE 87  < > 122*  BUN 18  < > 10  CREATININE 1.19  < > 0.92  CALCIUM 9.0  < > 7.7*  AST 110*  --   --   ALT 79*  --   --   ALKPHOS 227*  --   --   BILITOT 1.3*  --   --   < > = values in this interval not displayed.  Cardiac Enzymes No results for input(s): TROPONINI in the last 168 hours.  Microbiology Results  Results for orders placed or performed in visit on 10/27/12  Culture, blood (single)     Status: None   Collection Time: 10/27/12  2:12 PM  Result Value Ref Range Status   Micro Text Report   Final       COMMENT  NO GROWTH AEROBICALLY/ANAEROBICALLY IN 5 DAYS   ANTIBIOTIC                                                        RADIOLOGY:  No results found.  EKG:   Orders placed or performed during the hospital encounter of 12/03/15  . ED EKG  . ED EKG      Management plans discussed with the patient, family and they are in agreement.  CODE STATUS:     Code Status Orders        Start     Ordered   12/03/15 1437  Full code   Continuous     12/03/15 1439    Code Status History    Date Active Date Inactive Code Status Order ID Comments User Context   06/18/2015 10:38 AM 06/20/2015  2:52 PM Full Code [id number]  Alford Highland, MD ED      TOTAL TIME TAKING CARE OF THIS PATIENT: 37 minutes.    Enid Baas M.D on 12/09/2015 at 12:19 PM  Between 7am to 6pm - Pager - [phone]  After 6pm go to [email or web address] - password [email or web address] [facility]  [place] [facility]  Office  [phone]  CC: Primary care physician; No PCP Per Patient

## 2015-12-09 NOTE — Progress Notes (Signed)
DISCHARGE NOTE:  Pt given discharge instructions and prescriptions. Pt verbalized understanding. EMS here to transport pt home.

## 2015-12-09 NOTE — Progress Notes (Signed)
Per RN patient is not stable to go in a taxi. Clinical Child psychotherapistocial Worker (CSW) completed EMS form. RN confirmed patient's address and is aware of above. Please reconsult if future social work needs arise. CSW signing off.   Jetta LoutBailey Morgan, LCSW [phone]

## 2015-12-09 NOTE — Care Management (Signed)
Per CSW note 3/13: CSW offered patient substance abuse recourses. Patient refused substance abuse resources. Patient continued to refuse SNF and substance abuse resources throughout assessment.

## 2015-12-09 NOTE — Consult Note (Signed)
[facility] Face-to-Face Psychiatry Consult   Reason for Consult:  Consult for this 65 year old man currently in the hospital recovering from severe hyponatremia related to alcohol abuse. Consult for alcohol abuse. Referring Physician:  Tressia Miners Patient Identification: Dock Baccam MRN:  [id number] Principal Diagnosis: Delirium tremens [facility]) Diagnosis:   Patient Active Problem List   Diagnosis Date Noted  . Alcohol abuse [F10.10] 12/07/2015  . Delirium tremens ([place]) [F10.231] 12/07/2015  . Malnutrition of moderate degree [E44.0] 12/04/2015  . Hyponatremia [E87.1] 06/18/2015    Total Time spent with patient: 15 minutes  Subjective:   Luis [name] is a 65 y.o. male  Follow-up as of Wednesday the 15th. Patient interviewed. Chart reviewed. Patient reports that he is feeling better and is ready to go home today. Not having any hallucinations today. Alert and oriented. Mood is stable. Patient continues to state a desire to stop drinking and understanding of the severity of the medical problems related to his drinking.  HPI:  Patient interviewed. Chart reviewed. Labs and vitals reviewed. Also talked with the nursing staff on duty and social work covering the case. 65 year old man who is in the hospital recovering from severe hyponatremia. Was brought into the hospital with a sodium of 113. Was very confused at the time of presentation. He was referred here apparently from his apartment complex because of his repeated falls. Patient has been described as confused for most of the time he's been in the hospital since then which is about 4 days now. On interview today the patient does know that he is in [facility] and he knows the correct year. He knows that he is here because he has been having a lot of falls. He admits that he has been drinking heavily. He says he usually drinks between 12 and 18 beers per day. Denies that he is abusing any other drugs. Says that his mood currently is feeling fine.  Denies depression. Completely denies any suicidal or homicidal ideation. He admits that he is having visual hallucinations and has been seeing devils in his room today. Vital signs still show elevated blood pressure but pulse is stable. He didn't have a visual tremor that I can see. Patient was intermittently but not completely confused during the interview. He was able to repeat 3 words and actually remember 2 of them after a couple minutes but at the same time he tried to make a telephone call using the TV remote in his room and refused to believe that it was not the middle of the night even though it was bright light outside. Apparently patient was offered transfer to the [facility] but has refused.  Medical history: History of high blood pressure. Denies other medical problems except for having a lazy left eye. Patient presented here with severe hyponatremia. He says he's never however had a seizure from alcohol withdrawal.  Social history: Lives by himself in an apartment. Disabled. Patient is a veteran but is not service connected. He has 2 adult children who have been in some contact with the treatment team here and also a wife who is apparently still his legal wife but has not lived with him for some time who has made some contact here.  Substance abuse history: Patient states that he has had up to 9 months of sobriety in the past and he achieve this by staying in "a [place] place". Denies having had seizures. He denies having had complex withdrawal like this in the past. He denies any abuse of any other  drugs.  Past Psychiatric History: Patient denies ever having been treated for any kind of mental health problems in the past. Denies any history of suicidal behavior denies any history of violence. Denies psychiatric hospitalization.  Risk to Self: Is patient at risk for suicide?: No Risk to Others:   Prior Inpatient Therapy:   Prior Outpatient Therapy:    Past Medical History:  Past  Medical History  Diagnosis Date  . GERD (gastroesophageal reflux disease)   . Alcohol abuse     Past Surgical History  Procedure Laterality Date  . Cholecystectomy     Family History:  Family History  Problem Relation Age of Onset  . Alcohol abuse Mother   . Alcohol abuse Father    Family Psychiatric  History: Patient says there is a family history of alcohol abuse denies any other mental health history in the family Social History:  History  Alcohol Use  . 36.0 oz/week  . 60 Cans of beer per week     History  Drug Use No    Social History   Social History  . Marital Status: Legally Separated    Spouse Name: N/A  . Number of Children: N/A  . Years of Education: N/A   Social History Main Topics  . Smoking status: Current Every Day Smoker -- 0.50 packs/day  . Smokeless tobacco: None  . Alcohol Use: 36.0 oz/week    60 Cans of beer per week  . Drug Use: No  . Sexual Activity: Not Asked   Other Topics Concern  . None   Social History Narrative   Additional Social History:    Allergies:  No Known Allergies  Labs:  Results for orders placed or performed during the hospital encounter of 12/03/15 (from the past 48 hour(s))  Basic metabolic panel     Status: Abnormal   Collection Time: 12/08/15  3:58 AM  Result Value Ref Range   Sodium 129 (L) 135 - 145 mmol/L   Potassium 3.3 (L) 3.5 - 5.1 mmol/L   Chloride 102 101 - 111 mmol/L   CO2 22 22 - 32 mmol/L   Glucose, Bld 122 (H) 65 - 99 mg/dL   BUN 10 6 - 20 mg/dL   Creatinine, Ser 0.92 0.61 - 1.24 mg/dL   Calcium 7.7 (L) 8.9 - 10.3 mg/dL   GFR calc non Af Amer >60 >60 mL/min   GFR calc Af Amer >60 >60 mL/min    Comment: (NOTE) The eGFR has been calculated using the CKD EPI equation. This calculation has not been validated in all clinical situations. eGFR's persistently <60 mL/min signify possible Chronic Kidney Disease.    Anion gap 5 5 - 15    Current Facility-Administered Medications  Medication Dose  Route Frequency Provider Last Rate Last Dose  . acetaminophen (TYLENOL) tablet 650 mg  650 mg Oral Q6H PRN Gladstone Lighter, MD   650 mg at 12/08/15 1213  . alum & mag hydroxide-simeth (MAALOX/MYLANTA) 200-200-20 MG/5ML suspension 30 mL  30 mL Oral Q6H PRN Epifanio Lesches, MD   30 mL at 12/04/15 1801  . amLODipine (NORVASC) tablet 5 mg  5 mg Oral Daily Gladstone Lighter, MD   5 mg at 12/09/15 0801  . docusate sodium (COLACE) capsule 100 mg  100 mg Oral BID Gladstone Lighter, MD   100 mg at 12/09/15 0801  . feeding supplement (ENSURE ENLIVE) (ENSURE ENLIVE) liquid 237 mL  237 mL Oral BID BM Fritzi Mandes, MD   237 mL at 12/09/15 0802  .  folic acid (FOLVITE) tablet 1 mg  1 mg Oral Daily Lance Coon, MD   1 mg at 12/09/15 0801  . ipratropium-albuterol (DUONEB) 0.5-2.5 (3) MG/3ML nebulizer solution 3 mL  3 mL Inhalation Q6H PRN Epifanio Lesches, MD      . metoprolol tartrate (LOPRESSOR) tablet 25 mg  25 mg Oral BID Epifanio Lesches, MD   25 mg at 12/09/15 0800  . multivitamin with minerals tablet 1 tablet  1 tablet Oral Daily Lance Coon, MD   1 tablet at 12/09/15 0801  . nicotine (NICODERM CQ - dosed in mg/24 hours) patch 14 mg  14 mg Transdermal Daily Fritzi Mandes, MD   14 mg at 12/09/15 0801  . ondansetron (ZOFRAN) tablet 4 mg  4 mg Oral Q6H PRN Epifanio Lesches, MD       Or  . ondansetron (ZOFRAN) injection 4 mg  4 mg Intravenous Q6H PRN Epifanio Lesches, MD      . pantoprazole (PROTONIX) EC tablet 40 mg  40 mg Oral Daily Fritzi Mandes, MD   40 mg at 12/09/15 0800  . polyethylene glycol (MIRALAX / GLYCOLAX) packet 17 g  17 g Oral Daily PRN Gladstone Lighter, MD      . tamsulosin (FLOMAX) capsule 0.4 mg  0.4 mg Oral Daily Gladstone Lighter, MD   0.4 mg at 12/09/15 0801  . thiamine (VITAMIN B-1) tablet 100 mg  100 mg Oral Daily Lance Coon, MD   100 mg at 12/09/15 0801  . tiotropium (SPIRIVA) inhalation capsule 18 mcg  18 mcg Inhalation Daily Epifanio Lesches, MD   18 mcg at 12/09/15  0927  . traMADol (ULTRAM) tablet 50 mg  50 mg Oral Q6H PRN Gladstone Lighter, MD   50 mg at 12/07/15 1745    Musculoskeletal: Strength & Muscle Tone: decreased Gait & Station: unsteady Patient leans: N/A  Psychiatric Specialty Exam: Review of Systems  Constitutional: Positive for malaise/fatigue.  HENT: Negative.   Eyes: Negative.   Respiratory: Negative.   Cardiovascular: Negative.   Gastrointestinal: Negative.   Musculoskeletal: Negative.   Skin: Negative.   Neurological: Negative.   Psychiatric/Behavioral: Positive for substance abuse. Negative for depression, suicidal ideas, hallucinations and memory loss. The patient is not nervous/anxious and does not have insomnia.     Blood pressure 166/85, pulse 106, temperature 97.7 F (36.5 C), temperature source Oral, resp. rate 18, height 6' (1.829 m), weight 64.592 kg (142 lb 6.4 oz), SpO2 100 %.Body mass index is 19.31 kg/(m^2).  General Appearance: Disheveled  Eye Sport and exercise psychologist::  Fair  Speech:  Clear and Coherent  Volume:  Decreased  Mood:  Euthymic  Affect:  Congruent  Thought Process:  Goal Directed  Orientation:  Full (Time, Place, and Person)  Thought Content:  Negative  Suicidal Thoughts:  No  Homicidal Thoughts:  No  Memory:  Immediate;   Fair Recent;   Fair Remote;   Fair  Judgement:  Impaired  Insight:  Shallow  Psychomotor Activity:  Decreased  Concentration:  Poor  Recall:  [place]  Language: Fair  Akathisia:  No  Handed:  Right  AIMS (if indicated):     Assets:  Desire for Improvement Housing Social Support  ADL's:  Impaired  Cognition: WNL  Sleep:      Treatment Plan Summary: Plan patient being discharged today. Unfortunately inpatient substance abuse treatment is not currently an available option although it's utility in treatment is questionable in any case. Patient strongly encouraged to continue his efforts to stop drinking.  He will be able to follow-up with [facility] in the community  as well as with the [place] if he chooses and community substance abuse treatment options. Supportive counseling. No need for further inpatient treatment or medication at this point.  Disposition: Patient does not meet criteria for psychiatric inpatient admission. Supportive therapy provided about ongoing stressors.  Alethia Berthold, MD 12/09/2015 3:54 PM

## 2015-12-09 NOTE — Progress Notes (Signed)
PT is recommending home health/ outpatient PT. Patient walked 200 feet. RN Case Manager is aware of above. Please reconsult if future social work needs arise. CSW signing off.   Jetta LoutBailey Morgan, LCSW [phone]

## 2015-12-09 NOTE — Progress Notes (Signed)
   12/09/15 1428  Clinical Encounter Type  Visited With Patient  Visit Type Initial  Consult/Referral To Chaplain  Stress Factors  Patient Stress Factors (stressed over ride home. )  Chaplain visited with patient and provided pastoral care.   Fisher ScientificChaplain Ivyrose Hashman [phone]

## 2016-03-03 ENCOUNTER — Emergency Department: Payer: Medicare HMO

## 2016-03-03 ENCOUNTER — Encounter: Payer: Self-pay | Admitting: Emergency Medicine

## 2016-03-03 ENCOUNTER — Inpatient Hospital Stay
Admission: EM | Admit: 2016-03-03 | Discharge: 2016-03-08 | DRG: 640 | Disposition: A | Discharge status: Home Health | Payer: Medicare HMO | Attending: Internal Medicine | Admitting: Internal Medicine

## 2016-03-03 DIAGNOSIS — R296 Repeated falls: Secondary | ICD-10-CM | POA: Diagnosis present

## 2016-03-03 DIAGNOSIS — F17210 Nicotine dependence, cigarettes, uncomplicated: Secondary | ICD-10-CM | POA: Diagnosis present

## 2016-03-03 DIAGNOSIS — E871 Hypo-osmolality and hyponatremia: Principal | ICD-10-CM | POA: Diagnosis present

## 2016-03-03 DIAGNOSIS — E46 Unspecified protein-calorie malnutrition: Secondary | ICD-10-CM

## 2016-03-03 DIAGNOSIS — E860 Dehydration: Secondary | ICD-10-CM | POA: Diagnosis present

## 2016-03-03 DIAGNOSIS — G9341 Metabolic encephalopathy: Secondary | ICD-10-CM | POA: Diagnosis present

## 2016-03-03 DIAGNOSIS — I10 Essential (primary) hypertension: Secondary | ICD-10-CM | POA: Diagnosis present

## 2016-03-03 DIAGNOSIS — E861 Hypovolemia: Secondary | ICD-10-CM | POA: Diagnosis present

## 2016-03-03 DIAGNOSIS — Z811 Family history of alcohol abuse and dependence: Secondary | ICD-10-CM | POA: Diagnosis not present

## 2016-03-03 DIAGNOSIS — Z681 Body mass index (BMI) 19 or less, adult: Secondary | ICD-10-CM | POA: Diagnosis not present

## 2016-03-03 DIAGNOSIS — R627 Adult failure to thrive: Secondary | ICD-10-CM | POA: Diagnosis present

## 2016-03-03 DIAGNOSIS — F419 Anxiety disorder, unspecified: Secondary | ICD-10-CM | POA: Diagnosis present

## 2016-03-03 DIAGNOSIS — E43 Unspecified severe protein-calorie malnutrition: Secondary | ICD-10-CM | POA: Diagnosis present

## 2016-03-03 DIAGNOSIS — R262 Difficulty in walking, not elsewhere classified: Secondary | ICD-10-CM

## 2016-03-03 DIAGNOSIS — F1010 Alcohol abuse, uncomplicated: Secondary | ICD-10-CM | POA: Diagnosis present

## 2016-03-03 DIAGNOSIS — E876 Hypokalemia: Secondary | ICD-10-CM | POA: Diagnosis present

## 2016-03-03 DIAGNOSIS — F332 Major depressive disorder, recurrent severe without psychotic features: Secondary | ICD-10-CM

## 2016-03-03 LAB — COMPREHENSIVE METABOLIC PANEL
ALBUMIN: 3 g/dL — AB (ref 3.5–5.0)
ALT: 60 U/L (ref 17–63)
ANION GAP: 10 (ref 5–15)
AST: 83 U/L — ABNORMAL HIGH (ref 15–41)
Alkaline Phosphatase: 161 U/L — ABNORMAL HIGH (ref 38–126)
BUN: 13 mg/dL (ref 6–20)
CALCIUM: 8.5 mg/dL — AB (ref 8.9–10.3)
CHLORIDE: 90 mmol/L — AB (ref 101–111)
CO2: 23 mmol/L (ref 22–32)
Creatinine, Ser: 0.9 mg/dL (ref 0.61–1.24)
GFR calc non Af Amer: >60 mL/min (ref >60)
GLUCOSE: 114 mg/dL — AB (ref 65–99)
POTASSIUM: 3.6 mmol/L (ref 3.5–5.1)
SODIUM: 123 mmol/L — AB (ref 135–145)
Total Bilirubin: 0.6 mg/dL (ref 0.3–1.2)
Total Protein: 5.8 g/dL — ABNORMAL LOW (ref 6.5–8.1)

## 2016-03-03 LAB — MAGNESIUM: MAGNESIUM: 1.8 mg/dL (ref 1.7–2.4)

## 2016-03-03 LAB — CBC
HEMATOCRIT: 43.4 % (ref 40.0–52.0)
HEMOGLOBIN: 14.8 g/dL (ref 13.0–18.0)
MCH: 31.5 pg (ref 26.0–34.0)
MCHC: 34 g/dL (ref 32.0–36.0)
MCV: 92.8 fL (ref 80.0–100.0)
Platelets: 153 10*3/uL (ref 150–440)
RBC: 4.68 MIL/uL (ref 4.40–5.90)
RDW: 15.3 % — ABNORMAL HIGH (ref 11.5–14.5)
WBC: 12.3 10*3/uL — ABNORMAL HIGH (ref 3.8–10.6)

## 2016-03-03 LAB — LIPASE, BLOOD: LIPASE: 36 U/L (ref 11–51)

## 2016-03-03 LAB — OSMOLALITY: Osmolality: 258 mOsm/kg — ABNORMAL LOW (ref 275–295)

## 2016-03-03 LAB — ETHANOL

## 2016-03-03 LAB — CK: Total CK: 20 U/L — ABNORMAL LOW (ref 49–397)

## 2016-03-03 MED ORDER — VITAMIN B-1 100 MG PO TABS
100.0000 mg | ORAL_TABLET | Freq: Every day | ORAL | Status: DC
  Administered 2016-03-03 – 2016-03-07 (×5): 100 mg via ORAL
  Filled 2016-03-03 (×5): qty 1

## 2016-03-03 MED ORDER — LORAZEPAM 2 MG PO TABS: 0.0000 mg | ORAL_TABLET | Freq: Four times a day (QID) | ORAL | Status: AC

## 2016-03-03 MED ORDER — SENNOSIDES-DOCUSATE SODIUM 8.6-50 MG PO TABS: 1.0000 | ORAL_TABLET | Freq: Every evening | ORAL | Status: DC | PRN

## 2016-03-03 MED ORDER — ONDANSETRON HCL 4 MG PO TABS
4.0000 mg | ORAL_TABLET | Freq: Four times a day (QID) | ORAL | Status: DC | PRN
  Filled 2016-03-03: qty 1

## 2016-03-03 MED ORDER — PANTOPRAZOLE SODIUM 40 MG PO TBEC
40.0000 mg | DELAYED_RELEASE_TABLET | Freq: Every day | ORAL | Status: DC
  Administered 2016-03-04 – 2016-03-07 (×4): 40 mg via ORAL
  Filled 2016-03-03 (×4): qty 1

## 2016-03-03 MED ORDER — LORAZEPAM 2 MG PO TABS: 0.0000 mg | ORAL_TABLET | Freq: Two times a day (BID) | ORAL | Status: AC

## 2016-03-03 MED ORDER — NICOTINE 21 MG/24HR TD PT24
21.0000 mg | MEDICATED_PATCH | Freq: Every day | TRANSDERMAL | Status: DC | PRN
  Administered 2016-03-03 – 2016-03-04 (×2): 21 mg via TRANSDERMAL
  Filled 2016-03-03 (×3): qty 1

## 2016-03-03 MED ORDER — ENSURE ENLIVE PO LIQD
237.0000 mL | Freq: Two times a day (BID) | ORAL | Status: DC
  Administered 2016-03-04 – 2016-03-07 (×8): 237 mL via ORAL

## 2016-03-03 MED ORDER — TAMSULOSIN HCL 0.4 MG PO CAPS
0.4000 mg | ORAL_CAPSULE | Freq: Every day | ORAL | Status: DC
  Administered 2016-03-04 – 2016-03-07 (×4): 0.4 mg via ORAL
  Filled 2016-03-03 (×5): qty 1

## 2016-03-03 MED ORDER — THIAMINE HCL 100 MG/ML IJ SOLN
100.0000 mg | Freq: Every day | INTRAMUSCULAR | Status: DC
Start: 2016-03-03 — End: 2016-03-08

## 2016-03-03 MED ORDER — ACETAMINOPHEN 325 MG PO TABS
650.0000 mg | ORAL_TABLET | Freq: Four times a day (QID) | ORAL | Status: DC | PRN
  Administered 2016-03-03 – 2016-03-05 (×3): 650 mg via ORAL
  Filled 2016-03-03 (×3): qty 2

## 2016-03-03 MED ORDER — ENOXAPARIN SODIUM 40 MG/0.4ML ~~LOC~~ SOLN
40.0000 mg | SUBCUTANEOUS | Status: DC
  Administered 2016-03-03: 40 mg via SUBCUTANEOUS
  Filled 2016-03-03: qty 0.4

## 2016-03-03 MED ORDER — SODIUM CHLORIDE 0.9 % IV SOLN
Freq: Once | INTRAVENOUS | Status: AC
  Administered 2016-03-03: 20:00:00 via INTRAVENOUS

## 2016-03-03 MED ORDER — ADULT MULTIVITAMIN W/MINERALS CH
1.0000 | ORAL_TABLET | Freq: Every day | ORAL | Status: DC
Start: 2016-03-03 — End: 2016-03-08
  Administered 2016-03-03 – 2016-03-07 (×5): 1 via ORAL
  Filled 2016-03-03 (×5): qty 1

## 2016-03-03 MED ORDER — METOPROLOL TARTRATE 25 MG PO TABS
25.0000 mg | ORAL_TABLET | Freq: Two times a day (BID) | ORAL | Status: DC
  Administered 2016-03-03 – 2016-03-07 (×8): 25 mg via ORAL
  Filled 2016-03-03 (×8): qty 1

## 2016-03-03 MED ORDER — FOLIC ACID 1 MG PO TABS
1.0000 mg | ORAL_TABLET | Freq: Every day | ORAL | Status: DC
  Administered 2016-03-03 – 2016-03-07 (×5): 1 mg via ORAL
  Filled 2016-03-03 (×5): qty 1

## 2016-03-03 MED ORDER — CETYLPYRIDINIUM CHLORIDE 0.05 % MT LIQD
7.0000 mL | Freq: Two times a day (BID) | OROMUCOSAL | Status: DC
  Administered 2016-03-03 – 2016-03-07 (×7): 7 mL via OROMUCOSAL

## 2016-03-03 MED ORDER — CHLORDIAZEPOXIDE HCL 25 MG PO CAPS
25.0000 mg | ORAL_CAPSULE | Freq: Three times a day (TID) | ORAL | Status: DC
  Administered 2016-03-03 – 2016-03-04 (×4): 25 mg via ORAL
  Filled 2016-03-03 (×5): qty 1

## 2016-03-03 MED ORDER — AMLODIPINE BESYLATE 5 MG PO TABS
5.0000 mg | ORAL_TABLET | Freq: Every day | ORAL | Status: DC
  Administered 2016-03-04 – 2016-03-07 (×4): 5 mg via ORAL
  Filled 2016-03-03 (×4): qty 1

## 2016-03-03 MED ORDER — ESCITALOPRAM OXALATE 10 MG PO TABS
10.0000 mg | ORAL_TABLET | Freq: Every day | ORAL | Status: DC
  Administered 2016-03-04: 10:00:00 10 mg via ORAL
  Filled 2016-03-03 (×2): qty 1

## 2016-03-03 MED ORDER — ACETAMINOPHEN 650 MG RE SUPP: 650.0000 mg | Freq: Four times a day (QID) | RECTAL | Status: DC | PRN

## 2016-03-03 MED ORDER — ONDANSETRON HCL 4 MG/2ML IJ SOLN: 4.0000 mg | Freq: Four times a day (QID) | INTRAMUSCULAR | Status: DC | PRN

## 2016-03-03 NOTE — ED Notes (Addendum)
Message left w/ pts daughter regarding pts room 125.  She would like to be called with any updates [phone]

## 2016-03-03 NOTE — ED Notes (Signed)
Pt given cup of water and informed of need for urine sample.  Informed pt this RN would be returning in 30 min.

## 2016-03-03 NOTE — ED Notes (Signed)
Pt sts that last drink Saturday, sts he drinks 10 12oz beers normally.  Pts daughter sts that she saw pt Sunday, that he had a case of beer in house.

## 2016-03-03 NOTE — ED Notes (Addendum)
Pt sts that he fell yesterday, but story inconsistent w/ what he told MD York CeriseForbach.  Family sts that he was found down by police officer in road last week, that pt was unable to return home.  Pt denies incident occuring.  Pt denies recreational drug use.  Pt denies SI/HI.  Pt sts he gets around home w/o issue

## 2016-03-03 NOTE — H&P (Addendum)
PCP:  None  Chief Complaint:  Falling  HPI: This is a 65 year old male with known history of alcohol abuse who has been falling over the past 2 or 3 weeks. His daughter he's lost approximately 40 pounds in the last 2-3 months. He has been drinking mostly beer. His neighbors or friends bring him in. Per his daughter the patient is only very meticulous and clean but he has been expressing increased depression the last 6 months or so. His house is being known the disarray and since Sunday he has been on the same clothing. Here the moves or walks mostly sitting in his chair. [facility] [place] [facility] [place] and also started giving way his possessions. Someone called 911 today, the patient noted the daughter is aware whom, he was brought to the ER. In the ER the patient's sodium was 123 and the hospitalist was asked to admit.  Review of Systems:  The patient denies anorexia, fever, weight loss,, vision loss, decreased hearing, hoarseness, chest pain, syncope, dyspnea on exertion, peripheral edema, balance deficits, hemoptysis, abdominal pain, melena, hematochezia, severe indigestion/heartburn, hematuria, incontinence, genital sores, muscle weakness, suspicious skin lesions, transient blindness, difficulty walking, depression, unusual weight change, abnormal bleeding, enlarged lymph nodes, angioedema, and breast masses.  Past Medical History: Past Medical History  Diagnosis Date  . GERD (gastroesophageal reflux disease)   . Alcohol abuse    Past Surgical History  Procedure Laterality Date  . Cholecystectomy      Medications: Prior to Admission medications   Medication Sig Start Date End Date Taking? Authorizing Provider  tamsulosin (FLOMAX) 0.4 MG CAPS capsule Take 1 capsule (0.4 mg total) by mouth daily. 12/09/15  Yes Enid Baasadhika Kalisetti, MD  amLODipine (NORVASC) 5 MG tablet Take 1 tablet (5 mg total) by mouth daily. Patient not taking: Reported on 03/03/2016 12/09/15   Enid Baasadhika  Kalisetti, MD  esomeprazole (NEXIUM) 20 MG capsule Take 20 mg by mouth daily at 12 noon. Reported on 03/03/2016    Historical Provider, MD  metoprolol tartrate (LOPRESSOR) 25 MG tablet Take 1 tablet (25 mg total) by mouth 2 (two) times daily. Patient not taking: Reported on 03/03/2016 12/09/15   Enid Baasadhika Kalisetti, MD    Allergies:  No Known Allergies  Social History:  reports that he has been smoking.  He does not have any smokeless tobacco history on file. He reports that he drinks about 36.0 oz of alcohol per week. He reports that he does not use illicit drugs.  Family History: Family History  Problem Relation Age of Onset  . Alcohol abuse Mother   . Alcohol abuse Father     Physical Exam: Filed Vitals:   03/03/16 1819 03/03/16 1945 03/03/16 1952 03/03/16 2015  BP: 124/94  132/107   Pulse: 104 91 86 87  Temp: 97.8 F (36.6 C)     TempSrc: Oral     Resp: 18 15  17   Height: 6' (1.829 m)     Weight: 58.968 kg (130 lb)     SpO2: 100% 96%  100%    General:  Alert and oriented times three, Emaciated gentleman , no acute distress Eyes: PERRLA, pink conjunctiva, no scleral icterus ENT: Moist oral mucosa, neck supple, no thyromegaly Lungs: clear to ascultation, no wheeze, no crackles, no use of accessory muscles Cardiovascular: regular rate and rhythm, no regurgitation, no gallops, no murmurs. No carotid bruits, no JVD Abdomen: soft, positive BS, non-tender, non-distended, no organomegaly, not an acute abdomen GU: not examined Neuro: CN II -  XII grossly intact, sensation intact Musculoskeletal: strength 5/5 all extremities, no clubbing, cyanosis or edema Skin: no rash, no subcutaneous crepitation, no decubitus Psych: Expresses depression   Labs on Admission:   Recent Labs  03/03/16 1823 03/03/16 1827  NA  --  123*  K  --  3.6  CL  --  90*  CO2  --  23  GLUCOSE  --  114*  BUN  --  13  CREATININE  --  0.90  CALCIUM  --  8.5*  MG 1.8  --     Recent Labs   03/03/16 1827  AST 83*  ALT 60  ALKPHOS 161*  BILITOT 0.6  PROT 5.8*  ALBUMIN 3.0*    Recent Labs  03/03/16 1827  LIPASE 36    Recent Labs  03/03/16 1827  WBC 12.3*  HGB 14.8  HCT 43.4  MCV 92.8  PLT 153    Recent Labs  03/03/16 2006  CKTOTAL 20*   Invalid input(s): POCBNP No results for input(s): DDIMER in the last 72 hours. No results for input(s): HGBA1C in the last 72 hours. No results for input(s): CHOL, HDL, LDLCALC, TRIG, CHOLHDL, LDLDIRECT in the last 72 hours. No results for input(s): TSH, T4TOTAL, T3FREE, THYROIDAB in the last 72 hours.  Invalid input(s): FREET3 No results for input(s): VITAMINB12, FOLATE, FERRITIN, TIBC, IRON, RETICCTPCT in the last 72 hours.  Micro Results: No results found for this or any previous visit (from the past 240 hour(s)).   Radiological Exams on Admission: Ct Head Wo Contrast  03/03/2016  CLINICAL DATA:  Fall.  Initial encounter. EXAM: CT HEAD WITHOUT CONTRAST TECHNIQUE: Contiguous axial images were obtained from the base of the skull through the vertex without intravenous contrast. COMPARISON:  None. FINDINGS: Skull and Sinuses:Negative for acute fracture or hemo sinus. Small volume fluid within right mastoid air cells, likely inflammatory. Clear nasopharynx. Minimal fluid layering within the right sphenoid sinus. Visualized orbits: No acute finding. Punctate calcification near the medial canthus on the left. Brain: No evidence of acute infarction, hemorrhage, hydrocephalus, or mass lesion/mass effect. Generalized atrophy with mild predilection for the cerebellum in this patient with alcohol abuse. 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 IMPRESSION: 1. Negative for intracranial injury or fracture. 2. Small right mastoid effusion. Electronically Signed   By: Marnee Spring M.D.   On: 03/03/2016 21:05   Dg Chest Portable 1 View  03/03/2016  CLINICAL DATA:  65 year old male new with cough and possible aspiration. EXAM:  PORTABLE CHEST 1 VIEW COMPARISON:  Radiograph dated 06/18/2015 FINDINGS: The heart size and mediastinal contours are within normal limits. Both lungs are clear. The visualized skeletal structures are unremarkable. IMPRESSION: No active disease. Electronically Signed   By: Elgie Collard M.D.   On: 03/03/2016 20:14    Assessment/Plan Present on Admission:  . Hyponatremia -Admit to MedSurg  -Gentle IV fluid hydration, BMP every 6 hours -Urine and sodium and osmolality, TSH ordered  Alcohol abuse -CIWA protocol ordered. Librium 25 mg 3 times a day. The patient has a history of DTs. He states he just recently and 10 beers daily -Ammonia level in a.m.  Tobacco abuse -Nicotine patch, duonebs as needed  Depression -Patient denies suicidal ideation, but is in need of psych consult and continue at discharge. -Will start Lexapro   Protein calorie malnutrition/weight loss -Likely due to poor nutrition related to alcoholabuse. Will order Ensure, nutrition to see patient  Deconditioning/falling -PT consult -Falling may also be related to patient's hyponatremia  Hypertension -Resume home medications, when necessary medications ordered    Hudsen Fei 03/03/2016, 10:06 PM

## 2016-03-03 NOTE — ED Notes (Signed)
MD at bedside. 

## 2016-03-03 NOTE — ED Notes (Addendum)
Patient arrives with brother-in-law.  Patient lives alone and patient as been "laying around, not eating, and drinking alcohol".  Patient states he typically drinks 10 beers a day.  Patient's family has been concerned about patient for a couple of weeks, but has not been able to get patient to come in and be evaluated.  Family reports vomiting after eating for several months and intermittent abdominal pain.  Patient is unkempt, smells of alcohol, is alert and pleasant.

## 2016-03-03 NOTE — ED Provider Notes (Signed)
[facility] Emergency Department Provider Note  ____________________________________________  Time seen: Approximately 7:43 PM  I have reviewed the triage vital signs and the nursing notes.   HISTORY  Chief Complaint decreased PO intake  and Cough    HPI Luis [name] is a 65 y.o. male with a history of complicated alcohol withdrawal of delirium tremens and persistent alcohol use as well as tobacco abuse and prior drug use.  He was admitted about 3 months ago for altered mental status, jaundice weakness, hyponatremia / beer powder mania.  He presents tonight at the insistence of his family for failure to thrive, malnutrition, and generalized weakness with an inability to walk.  Reportedly he has been drinking nothing but beer for the last several months with very little food intake.  His family has been worried about him and over the last week he had a fall and has been unable to walk, being confined to his chair for days.  He does admit that he has not been able to get up and move around at all.  He states that people bring his beer to him.  He also states that he "knew this was coming" meaning the intervention of his family, and he states he has not had a drink since Saturday, although his daughter was at his house yesterday and states that he had half drunk glasses of beer scattered around his chair.  He does not have any specific complaints at this time including no abdominal pain, fever, chills, chest pain, shortness of breath, dysuria.  He does not answer when asked if he is doing any other drugs.  He adamantly states that he is not suicidal and has no desire to die and knows that he has an alcohol problem.  He is alert and oriented at this time although he is slurring his words.  His family reports that he has lost at least 50 pounds in the last few months since he was last in the hospital.  Is typically drinking at least 10 beers a day and having very little food.   He knows that his symptoms are severe and nothing makes them better or worse.   Past Medical History  Diagnosis Date  . GERD (gastroesophageal reflux disease)   . Alcohol abuse     Patient Active Problem List   Diagnosis Date Noted  . Alcohol abuse 12/07/2015  . Delirium tremens (HCC) 12/07/2015  . Malnutrition of moderate degree 12/04/2015  . Hyponatremia 06/18/2015    Past Surgical History  Procedure Laterality Date  . Cholecystectomy      No current outpatient prescriptions on file.  Allergies Review of patient's allergies indicates no known allergies.  Family History  Problem Relation Age of Onset  . Alcohol abuse Mother   . Alcohol abuse Father     Social History Social History  Substance Use Topics  . Smoking status: Current Every Day Smoker -- 0.50 packs/day  . Smokeless tobacco: None  . Alcohol Use: 36.0 oz/week    60 Cans of beer per week    Review of Systems Constitutional: No fever/chills.  Generalized weakness, inability to walk, malnutrition Eyes: No visual changes. ENT: No sore throat. Cardiovascular: Denies chest pain. Respiratory: Denies shortness of breath. Gastrointestinal: No abdominal pain.  No nausea, no vomiting.  No diarrhea.  No constipation. Genitourinary: Negative for dysuria. Musculoskeletal: Negative for back pain. Skin: Negative for rash. Neurological: Negative for headaches, focal weakness or numbness.  10-point ROS otherwise negative.  ____________________________________________  PHYSICAL EXAM:  VITAL SIGNS: ED Triage Vitals  Enc Vitals Group     BP 03/03/16 1819 124/94 mmHg     Pulse Rate 03/03/16 1819 104     Resp 03/03/16 1819 18     Temp 03/03/16 1819 97.8 F (36.6 C)     Temp Source 03/03/16 1819 Oral     SpO2 03/03/16 1819 100 %     Weight 03/03/16 1819 130 lb (58.968 kg)     Height 03/03/16 1819 6' (1.829 m)     Head Cir --      Peak Flow --      Pain Score 03/03/16 1820 0     Pain Loc --      Pain  Edu? --      Excl. in GC? --     Constitutional: Alert and oriented. Disheveled, slurring his speech but in no acute distress Eyes: Conjunctivae are normal. PERRL. EOMI. Head: Atraumatic. Nose: No congestion/rhinnorhea. Mouth/Throat: Mucous membranes are moist.  Oropharynx non-erythematous. Neck: No stridor.  No meningeal signs.   Cardiovascular: Normal rate, regular rhythm. Good peripheral circulation. Grossly normal heart sounds.   Respiratory: Normal respiratory effort.  No retractions. Lungs CTAB. Gastrointestinal: Soft and nontender. No distention.  Musculoskeletal: No lower extremity tenderness nor edema. No gross deformities of extremities. Neurologic:  Normal speech and language. No gross focal neurologic deficits are appreciated.  Skin:  Skin is warm, dry and intact. No rash noted. Psychiatric: The patient appears to have the capacity to make his own decisions.  He is alert and oriented, denies SI, and acknowledges that he has a problem with alcohol that is affecting his life and may eventually result in his death if he continues on the same path.  ____________________________________________   LABS (all labs ordered are listed, but only abnormal results are displayed)  Labs Reviewed  COMPREHENSIVE METABOLIC PANEL - Abnormal; Notable for the following:    Sodium 123 (*)    Chloride 90 (*)    Glucose, Bld 114 (*)    Calcium 8.5 (*)    Total Protein 5.8 (*)    Albumin 3.0 (*)    AST 83 (*)    Alkaline Phosphatase 161 (*)    All other components within normal limits  CBC - Abnormal; Notable for the following:    WBC 12.3 (*)    RDW 15.3 (*)    All other components within normal limits  OSMOLALITY - Abnormal; Notable for the following:    Osmolality 258 (*)    All other components within normal limits  CK - Abnormal; Notable for the following:    Total CK 20 (*)    All other components within normal limits  ETHANOL  LIPASE, BLOOD  MAGNESIUM  URINALYSIS  COMPLETEWITH MICROSCOPIC ([facility] ONLY)  URINE DRUG SCREEN, QUALITATIVE ([facility] ONLY)  TSH  BASIC METABOLIC PANEL  BASIC METABOLIC PANEL  BASIC METABOLIC PANEL  CBC  CBC  BASIC METABOLIC PANEL  BASIC METABOLIC PANEL   ____________________________________________  EKG  ED ECG REPORT I, Jeevan Kalla, the attending physician, personally viewed and interpreted this ECG.  Date: 03/03/2016 EKG Time: 20:27 Rate: 83 Rhythm: normal sinus rhythm QRS Axis: normal Intervals: normal ST/T Wave abnormalities: Non-specific ST segment / T-wave changes, but no evidence of acute ischemia. Conduction Disturbances: none Narrative Interpretation: unremarkable  ____________________________________________  RADIOLOGY   Ct Head Wo Contrast  03/03/2016  CLINICAL DATA:  Fall.  Initial encounter. EXAM: CT HEAD WITHOUT CONTRAST TECHNIQUE: Contiguous axial images were  obtained from the base of the skull through the vertex without intravenous contrast. COMPARISON:  None. FINDINGS: Skull and Sinuses:Negative for acute fracture or hemo sinus. Small volume fluid within right mastoid air cells, likely inflammatory. Clear nasopharynx. Minimal fluid layering within the right sphenoid sinus. Visualized orbits: No acute finding. Punctate calcification near the medial canthus on the left. Brain: No evidence of acute infarction, hemorrhage, hydrocephalus, or mass lesion/mass effect. Generalized atrophy with mild predilection for the cerebellum in this patient with alcohol abuse. 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 IMPRESSION: 1. Negative for intracranial injury or fracture. 2. Small right mastoid effusion. Electronically Signed   By: Marnee Spring M.D.   On: 03/03/2016 21:05   Dg Chest Portable 1 View  03/03/2016  CLINICAL DATA:  65 year old male new with cough and possible aspiration. EXAM: PORTABLE CHEST 1 VIEW COMPARISON:  Radiograph dated 06/18/2015 FINDINGS: The heart size and mediastinal contours are  within normal limits. Both lungs are clear. The visualized skeletal structures are unremarkable. IMPRESSION: No active disease. Electronically Signed   By: Elgie Collard M.D.   On: 03/03/2016 20:14    ____________________________________________   PROCEDURES  Procedure(s) performed: None  Critical Care performed: No ____________________________________________   INITIAL IMPRESSION / ASSESSMENT AND PLAN / ED COURSE  Pertinent labs & imaging results that were available during my care of the patient were reviewed by me and considered in my medical decision making (see chart for details).  I had an extensive discussion with the patient and family regarding the patient's chronic decline.  I explained that no one can help him if he does not want help himself.  He indicates that he does want help with his alcoholism and that is why he stopped drinking several days ago.  He has no signs or symptoms of withdrawal at this point.  However his sodium is quite low at 123 with likely beer potomania.  We will admit the patient for slow and safe correction of his hyponatremia and for further evaluation by PT and OT for his reported inability to ambulate.  He may need placement if his weakness does not improve after correction of his hyponatremia and a dressing is malnutrition.  I gave him thiamine, folate, and a multivitamin in the emergency department.  I discussed the case with the hospitalist to admit.  I held off on IV fluids given my concern that I may correct him too quickly.   ____________________________________________  FINAL CLINICAL IMPRESSION(S) / ED DIAGNOSES  Final diagnoses:  Hyponatremia  Malnutrition (HCC)  Chronic alcohol abuse  Failure to thrive in adult  Inability to walk     MEDICATIONS GIVEN DURING THIS VISIT:  Medications  LORazepam (ATIVAN) tablet 0-4 mg (0 mg Oral Not Given 03/03/16 2022)    Followed by  LORazepam (ATIVAN) tablet 0-4 mg (not administered)    thiamine (VITAMIN B-1) tablet 100 mg (100 mg Oral Given 03/03/16 2012)    Or  thiamine (B-1) injection 100 mg ( Intravenous See Alternative 03/03/16 2012)  nicotine (NICODERM CQ - dosed in mg/24 hours) patch 21 mg (21 mg Transdermal Patch Applied 03/03/16 2012)  folic acid (FOLVITE) tablet 1 mg (1 mg Oral Given 03/03/16 2101)  multivitamin with minerals tablet 1 tablet (1 tablet Oral Given 03/03/16 2101)  amLODipine (NORVASC) tablet 5 mg (not administered)  metoprolol tartrate (LOPRESSOR) tablet 25 mg (25 mg Oral Given 03/03/16 2322)  tamsulosin (FLOMAX) capsule 0.4 mg (not administered)  pantoprazole (PROTONIX) EC tablet 40 mg (not administered)  enoxaparin (LOVENOX) injection 40 mg (40 mg Subcutaneous Given 03/03/16 2322)  acetaminophen (TYLENOL) tablet 650 mg (650 mg Oral Given 03/03/16 2333)    Or  acetaminophen (TYLENOL) suppository 650 mg ( Rectal See Alternative 03/03/16 2333)  senna-docusate (Senokot-S) tablet 1 tablet (not administered)  ondansetron (ZOFRAN) tablet 4 mg (not administered)    Or  ondansetron (ZOFRAN) injection 4 mg (not administered)  chlordiazePOXIDE (LIBRIUM) capsule 25 mg (25 mg Oral Given 03/03/16 2322)  escitalopram (LEXAPRO) tablet 10 mg (not administered)  feeding supplement (ENSURE ENLIVE) (ENSURE ENLIVE) liquid 237 mL (not administered)  antiseptic oral rinse (CPC / CETYLPYRIDINIUM CHLORIDE 0.05%) solution 7 mL (7 mLs Mouth Rinse Given 03/03/16 2323)  0.9 %  sodium chloride infusion ( Intravenous Stopped 03/03/16 2022)     NEW OUTPATIENT MEDICATIONS STARTED DURING THIS VISIT:  Current Discharge Medication List        Note:  This document was prepared using Dragon voice recognition software and may include unintentional dictation errors.   Loleta Roseory Aparna Vanderweele, MD 03/03/16 2355

## 2016-03-04 DIAGNOSIS — F332 Major depressive disorder, recurrent severe without psychotic features: Secondary | ICD-10-CM

## 2016-03-04 DIAGNOSIS — E43 Unspecified severe protein-calorie malnutrition: Secondary | ICD-10-CM | POA: Insufficient documentation

## 2016-03-04 LAB — BASIC METABOLIC PANEL
Anion gap: 8 (ref 5–15)
Anion gap: 6 (ref 5–15)
BUN: 16 mg/dL (ref 6–20)
BUN: 17 mg/dL (ref 6–20)
CHLORIDE: 89 mmol/L — AB (ref 101–111)
CHLORIDE: 92 mmol/L — AB (ref 101–111)
CO2: 25 mmol/L (ref 22–32)
CO2: 25 mmol/L (ref 22–32)
CREATININE: 0.81 mg/dL (ref 0.61–1.24)
CREATININE: 0.97 mg/dL (ref 0.61–1.24)
Calcium: 8.2 mg/dL — ABNORMAL LOW (ref 8.9–10.3)
Calcium: 7.9 mg/dL — ABNORMAL LOW (ref 8.9–10.3)
GFR calc Af Amer: >60 mL/min (ref >60)
GFR calc Af Amer: >60 mL/min (ref >60)
GFR calc non Af Amer: >60 mL/min (ref >60)
GFR calc non Af Amer: >60 mL/min (ref >60)
GLUCOSE: 100 mg/dL — AB (ref 65–99)
GLUCOSE: 107 mg/dL — AB (ref 65–99)
Potassium: 3.6 mmol/L (ref 3.5–5.1)
Potassium: 3.1 mmol/L — ABNORMAL LOW (ref 3.5–5.1)
Sodium: 122 mmol/L — ABNORMAL LOW (ref 135–145)
Sodium: 123 mmol/L — ABNORMAL LOW (ref 135–145)

## 2016-03-04 LAB — URINALYSIS COMPLETE WITH MICROSCOPIC (ARMC ONLY)
BILIRUBIN URINE: NEGATIVE
GLUCOSE, UA: NEGATIVE mg/dL
NITRITE: NEGATIVE
Protein, ur: NEGATIVE mg/dL
Specific Gravity, Urine: 1.017 (ref 1.005–1.030)
Squamous Epithelial / LPF: NONE SEEN
pH: 5 (ref 5.0–8.0)

## 2016-03-04 LAB — CBC
HCT: 37.6 % — ABNORMAL LOW (ref 40.0–52.0)
HEMATOCRIT: 34.5 % — AB (ref 40.0–52.0)
Hemoglobin: 12.1 g/dL — ABNORMAL LOW (ref 13.0–18.0)
Hemoglobin: 13.1 g/dL (ref 13.0–18.0)
MCH: 32.5 pg (ref 26.0–34.0)
MCH: 32.5 pg (ref 26.0–34.0)
MCHC: 34.9 g/dL (ref 32.0–36.0)
MCHC: 35 g/dL (ref 32.0–36.0)
MCV: 93.1 fL (ref 80.0–100.0)
MCV: 92.9 fL (ref 80.0–100.0)
PLATELETS: 127 10*3/uL — AB (ref 150–440)
Platelets: 122 10*3/uL — ABNORMAL LOW (ref 150–440)
RBC: 3.71 MIL/uL — ABNORMAL LOW (ref 4.40–5.90)
RBC: 4.04 MIL/uL — AB (ref 4.40–5.90)
RDW: 15.1 % — AB (ref 11.5–14.5)
RDW: 15.2 % — AB (ref 11.5–14.5)
WBC: 10.8 10*3/uL — ABNORMAL HIGH (ref 3.8–10.6)
WBC: 9.2 10*3/uL (ref 3.8–10.6)

## 2016-03-04 LAB — URINE DRUG SCREEN, QUALITATIVE (ARMC ONLY)
Amphetamines, Ur Screen: NOT DETECTED
BARBITURATES, UR SCREEN: NOT DETECTED
BENZODIAZEPINE, UR SCRN: POSITIVE — AB
CANNABINOID 50 NG, UR ~~LOC~~: NOT DETECTED
COCAINE METABOLITE, UR ~~LOC~~: NOT DETECTED
MDMA (Ecstasy)Ur Screen: NOT DETECTED
Methadone Scn, Ur: NOT DETECTED
Opiate, Ur Screen: NOT DETECTED
Phencyclidine (PCP) Ur S: NOT DETECTED
TRICYCLIC, UR SCREEN: NOT DETECTED

## 2016-03-04 LAB — SODIUM
Sodium: 122 mmol/L — ABNORMAL LOW (ref 135–145)
Sodium: 122 mmol/L — ABNORMAL LOW (ref 135–145)
Sodium: 124 mmol/L — ABNORMAL LOW (ref 135–145)

## 2016-03-04 LAB — AMMONIA: AMMONIA: 19 umol/L (ref 9–35)

## 2016-03-04 LAB — TSH: TSH: 4.904 u[IU]/mL — ABNORMAL HIGH (ref 0.350–4.500)

## 2016-03-04 MED ORDER — SODIUM CHLORIDE 0.9 % IV SOLN
INTRAVENOUS | Status: DC
  Administered 2016-03-04 – 2016-03-06 (×7): via INTRAVENOUS

## 2016-03-04 MED ORDER — POTASSIUM CHLORIDE CRYS ER 20 MEQ PO TBCR
40.0000 meq | EXTENDED_RELEASE_TABLET | ORAL | Status: AC
  Administered 2016-03-04 (×2): 40 meq via ORAL
  Filled 2016-03-04 (×2): qty 2

## 2016-03-04 MED ORDER — MIRTAZAPINE 15 MG PO TABS
15.0000 mg | ORAL_TABLET | Freq: Every day | ORAL | Status: DC
  Administered 2016-03-04 – 2016-03-06 (×3): 15 mg via ORAL
  Filled 2016-03-04 (×3): qty 1

## 2016-03-04 MED ORDER — ENOXAPARIN SODIUM 40 MG/0.4ML ~~LOC~~ SOLN
40.0000 mg | SUBCUTANEOUS | Status: DC
  Administered 2016-03-04 – 2016-03-07 (×4): 40 mg via SUBCUTANEOUS
  Filled 2016-03-04 (×4): qty 0.4

## 2016-03-04 NOTE — Consult Note (Signed)
[facility] Consult   Reason for Consult:  Consult for this 65 year old man brought in at the urging of his daughter because of a rapid decline in his self-care. Consult for depression Referring Physician:  Tressia Miners Patient Identification: Luis [name] MRN:  [id number] Principal Diagnosis: Severe recurrent major depression without psychotic features [facility]) Diagnosis:   Patient Active Problem List   Diagnosis Date Noted  . Protein-calorie malnutrition, severe [E43] 03/04/2016  . Severe recurrent major depression without psychotic features ([place]) [F33.2] 03/04/2016  . Alcohol abuse [F10.10] 12/07/2015  . Delirium tremens ([place]) [F10.231] 12/07/2015  . Malnutrition of moderate degree [E44.0] 12/04/2015  . Hyponatremia [E87.1] 06/18/2015    Total Time spent with patient: 1 hour  Subjective:   Luis [name] is a 65 y.o. male patient admitted with "I've been falling down".  HPI:  Patient interviewed. Chart reviewed. Labs and vitals reviewed. 65 year old man brought to the emergency room at the urging of his daughter because of recent falls at home and what appears to be a rapid decline in his self-care. Patient admits that he has fallen multiple times over the last few weeks. He says his mood has been depressed. He admits that he is not eating well. He has lost probably 40 pounds over the last few weeks to months. Daughter reports that his self-care has become much worse. Patient says he has nothing to do and feels lonely much of the time. Little activity during the day. Poor sleep court. Poor appetite. Admits that he's been drinking regularly. Says he hadn't had any alcohol in about a week before coming into the hospital but before that was drinking about 10 beers a day. Not getting any outpatient psychiatric treatment.  Social history: Patient lives by himself. Has at least 1 adult daughter who is making some effort to look out for him. Otherwise little social activity. He says he  does keep some social contact with the people in his apartment complex.  Medical history: History of recurrent hyponatremia which is also part of his admission now. History of malnutrition. History of hypertension and gastric reflux discomfort.  Substance abuse history: Long history of alcohol abuse. Identified admissions for alcohol abuse probably about 8 years ago. He says he hasn't had a drink in about a week. He thinks he has never had a seizure from drinking in the past. Denies that he is abusing any other drugs. Says he gave that up years ago. In the past had done some outpatient substance abuse treatment but has not been keeping up with additional long time.  Past Psychiatric History: Patient denies that he's ever try to kill himself. He has had psychiatric hospitalization at our facility about 7 years ago although he claims not to remember much about it and the full note is not available. He was diagnosed with both depression and alcohol problems at that time. Has not been compliant with any recent outpatient psychiatric treatment. Denies any history of psychotic symptoms.  Risk to Self: Is patient at risk for suicide?: No Risk to Others:   Prior Inpatient Therapy:   Prior Outpatient Therapy:    Past Medical History:  Past Medical History  Diagnosis Date  . GERD (gastroesophageal reflux disease)   . Alcohol abuse     Past Surgical History  Procedure Laterality Date  . Cholecystectomy     Family History:  Family History  Problem Relation Age of Onset  . Alcohol abuse Mother   . Alcohol abuse Father    Family Psychiatric  History: Positive for alcohol abuse denies any other mental health problems in his family Social History:  History  Alcohol Use  . 36.0 oz/week  . 60 Cans of beer per week     History  Drug Use No    Social History   Social History  . Marital Status: Legally Separated    Spouse Name: N/A  . Number of Children: N/A  . Years of Education: N/A    Social History Main Topics  . Smoking status: Current Every Day Smoker -- 0.50 packs/day  . Smokeless tobacco: None  . Alcohol Use: 36.0 oz/week    60 Cans of beer per week  . Drug Use: No  . Sexual Activity: Not Asked   Other Topics Concern  . None   Social History Narrative   Additional Social History:    Allergies:  No Known Allergies  Labs:  Results for orders placed or performed during the hospital encounter of 03/03/16 (from the past 48 hour(s))  Urinalysis complete, with microscopic     Status: Abnormal   Collection Time: 03/03/16 12:28 PM  Result Value Ref Range   Color, Urine AMBER (A) YELLOW   APPearance HAZY (A) CLEAR   Glucose, UA NEGATIVE NEGATIVE mg/dL   Bilirubin Urine NEGATIVE NEGATIVE   Ketones, ur TRACE (A) NEGATIVE mg/dL   Specific Gravity, Urine 1.017 1.005 - 1.030   Hgb urine dipstick 2+ (A) NEGATIVE   pH 5.0 5.0 - 8.0   Protein, ur NEGATIVE NEGATIVE mg/dL   Nitrite NEGATIVE NEGATIVE   Leukocytes, UA 1+ (A) NEGATIVE   RBC / HPF 6-30 0 - 5 RBC/hpf   WBC, UA 6-30 0 - 5 WBC/hpf   Bacteria, UA RARE (A) NONE SEEN   Squamous Epithelial / LPF NONE SEEN NONE SEEN   Mucous PRESENT    Hyaline Casts, UA PRESENT   Magnesium     Status: None   Collection Time: 03/03/16  6:23 PM  Result Value Ref Range   Magnesium 1.8 1.7 - 2.4 mg/dL  Ethanol     Status: None   Collection Time: 03/03/16  6:27 PM  Result Value Ref Range   Alcohol, Ethyl (B) <5 <5 mg/dL    Comment:        LOWEST DETECTABLE LIMIT FOR SERUM ALCOHOL IS 5 mg/dL FOR MEDICAL PURPOSES ONLY   Lipase, blood     Status: None   Collection Time: 03/03/16  6:27 PM  Result Value Ref Range   Lipase 36 11 - 51 U/L  Comprehensive metabolic panel     Status: Abnormal   Collection Time: 03/03/16  6:27 PM  Result Value Ref Range   Sodium 123 (L) 135 - 145 mmol/L   Potassium 3.6 3.5 - 5.1 mmol/L   Chloride 90 (L) 101 - 111 mmol/L   CO2 23 22 - 32 mmol/L   Glucose, Bld 114 (H) 65 - 99 mg/dL   BUN  13 6 - 20 mg/dL   Creatinine, Ser 0.90 0.61 - 1.24 mg/dL   Calcium 8.5 (L) 8.9 - 10.3 mg/dL   Total Protein 5.8 (L) 6.5 - 8.1 g/dL   Albumin 3.0 (L) 3.5 - 5.0 g/dL   AST 83 (H) 15 - 41 U/L   ALT 60 17 - 63 U/L   Alkaline Phosphatase 161 (H) 38 - 126 U/L   Total Bilirubin 0.6 0.3 - 1.2 mg/dL   GFR calc non Af Amer >60 >60 mL/min   GFR calc Af Amer >60 >60 mL/min  Comment: (NOTE) The eGFR has been calculated using the CKD EPI equation. This calculation has not been validated in all clinical situations. eGFR's persistently <60 mL/min signify possible Chronic Kidney Disease.    Anion gap 10 5 - 15  CBC     Status: Abnormal   Collection Time: 03/03/16  6:27 PM  Result Value Ref Range   WBC 12.3 (H) 3.8 - 10.6 K/uL   RBC 4.68 4.40 - 5.90 MIL/uL   Hemoglobin 14.8 13.0 - 18.0 g/dL   HCT 43.4 40.0 - 52.0 %   MCV 92.8 80.0 - 100.0 fL   MCH 31.5 26.0 - 34.0 pg   MCHC 34.0 32.0 - 36.0 g/dL   RDW 15.3 (H) 11.5 - 14.5 %   Platelets 153 150 - 440 K/uL  Osmolality     Status: Abnormal   Collection Time: 03/03/16  8:06 PM  Result Value Ref Range   Osmolality 258 (L) 275 - 295 mOsm/kg  CK     Status: Abnormal   Collection Time: 03/03/16  8:06 PM  Result Value Ref Range   Total CK 20 (L) 49 - 397 U/L  Basic metabolic panel     Status: Abnormal   Collection Time: 03/04/16 12:25 AM  Result Value Ref Range   Sodium 122 (L) 135 - 145 mmol/L   Potassium 3.6 3.5 - 5.1 mmol/L   Chloride 89 (L) 101 - 111 mmol/L   CO2 25 22 - 32 mmol/L   Glucose, Bld 107 (H) 65 - 99 mg/dL   BUN 16 6 - 20 mg/dL   Creatinine, Ser 0.97 0.61 - 1.24 mg/dL   Calcium 8.2 (L) 8.9 - 10.3 mg/dL   GFR calc non Af Amer >60 >60 mL/min   GFR calc Af Amer >60 >60 mL/min    Comment: (NOTE) The eGFR has been calculated using the CKD EPI equation. This calculation has not been validated in all clinical situations. eGFR's persistently <60 mL/min signify possible Chronic Kidney Disease.    Anion gap 8 5 - 15  CBC      Status: Abnormal   Collection Time: 03/04/16 12:25 AM  Result Value Ref Range   WBC 10.8 (H) 3.8 - 10.6 K/uL   RBC 4.04 (L) 4.40 - 5.90 MIL/uL   Hemoglobin 13.1 13.0 - 18.0 g/dL   HCT 37.6 (L) 40.0 - 52.0 %   MCV 92.9 80.0 - 100.0 fL   MCH 32.5 26.0 - 34.0 pg   MCHC 35.0 32.0 - 36.0 g/dL   RDW 15.1 (H) 11.5 - 14.5 %   Platelets 127 (L) 150 - 440 K/uL  TSH     Status: Abnormal   Collection Time: 03/04/16  4:17 AM  Result Value Ref Range   TSH 4.904 (H) 0.350 - 4.500 uIU/mL  Basic metabolic panel     Status: Abnormal   Collection Time: 03/04/16  4:17 AM  Result Value Ref Range   Sodium 123 (L) 135 - 145 mmol/L   Potassium 3.1 (L) 3.5 - 5.1 mmol/L   Chloride 92 (L) 101 - 111 mmol/L   CO2 25 22 - 32 mmol/L   Glucose, Bld 100 (H) 65 - 99 mg/dL   BUN 17 6 - 20 mg/dL   Creatinine, Ser 0.81 0.61 - 1.24 mg/dL   Calcium 7.9 (L) 8.9 - 10.3 mg/dL   GFR calc non Af Amer >60 >60 mL/min   GFR calc Af Amer >60 >60 mL/min    Comment: (NOTE) The eGFR has been  calculated using the CKD EPI equation. This calculation has not been validated in all clinical situations. eGFR's persistently <60 mL/min signify possible Chronic Kidney Disease.    Anion gap 6 5 - 15  CBC     Status: Abnormal   Collection Time: 03/04/16  4:17 AM  Result Value Ref Range   WBC 9.2 3.8 - 10.6 K/uL   RBC 3.71 (L) 4.40 - 5.90 MIL/uL   Hemoglobin 12.1 (L) 13.0 - 18.0 g/dL   HCT 34.5 (L) 40.0 - 52.0 %   MCV 93.1 80.0 - 100.0 fL   MCH 32.5 26.0 - 34.0 pg   MCHC 34.9 32.0 - 36.0 g/dL   RDW 15.2 (H) 11.5 - 14.5 %   Platelets 122 (L) 150 - 440 K/uL  Sodium     Status: Abnormal   Collection Time: 03/04/16 10:45 AM  Result Value Ref Range   Sodium 122 (L) 135 - 145 mmol/L  Ammonia     Status: None   Collection Time: 03/04/16 10:45 AM  Result Value Ref Range   Ammonia 19 9 - 35 umol/L  Sodium     Status: Abnormal   Collection Time: 03/04/16  3:08 PM  Result Value Ref Range   Sodium 122 (L) 135 - 145 mmol/L     Current Facility-Administered Medications  Medication Dose Route Frequency Provider Last Rate Last Dose  . 0.9 %  sodium chloride infusion   Intravenous Continuous Debby Crosley, MD 100 mL/hr at 03/04/16 1359    . acetaminophen (TYLENOL) tablet 650 mg  650 mg Oral Q6H PRN Debby Crosley, MD   650 mg at 03/04/16 0636   Or  . acetaminophen (TYLENOL) suppository 650 mg  650 mg Rectal Q6H PRN Debby Crosley, MD      . amLODipine (NORVASC) tablet 5 mg  5 mg Oral Daily Debby Crosley, MD   5 mg at 03/04/16 1014  . antiseptic oral rinse (CPC / CETYLPYRIDINIUM CHLORIDE 0.05%) solution 7 mL  7 mL Mouth Rinse BID Debby Crosley, MD   7 mL at 03/04/16 1000  . chlordiazePOXIDE (LIBRIUM) capsule 25 mg  25 mg Oral TID Quintella Baton, MD   25 mg at 03/04/16 1015  . enoxaparin (LOVENOX) injection 40 mg  40 mg Subcutaneous Q24H Gladstone Lighter, MD      . escitalopram (LEXAPRO) tablet 10 mg  10 mg Oral Daily Debby Crosley, MD   10 mg at 03/04/16 1016  . feeding supplement (ENSURE ENLIVE) (ENSURE ENLIVE) liquid 237 mL  237 mL Oral BID BM Debby Crosley, MD   237 mL at 03/04/16 1000  . folic acid (FOLVITE) tablet 1 mg  1 mg Oral Daily Hinda Kehr, MD   1 mg at 03/04/16 1016  . LORazepam (ATIVAN) tablet 0-4 mg  0-4 mg Oral Q6H Hinda Kehr, MD   Stopped at 03/04/16 1200   Followed by  . [START ON 03/05/2016] LORazepam (ATIVAN) tablet 0-4 mg  0-4 mg Oral Q12H Hinda Kehr, MD      . metoprolol tartrate (LOPRESSOR) tablet 25 mg  25 mg Oral BID Debby Crosley, MD   25 mg at 03/04/16 1014  . mirtazapine (REMERON) tablet 15 mg  15 mg Oral QHS Gonzella Lex, MD      . multivitamin with minerals tablet 1 tablet  1 tablet Oral Daily Hinda Kehr, MD   1 tablet at 03/04/16 1016  . nicotine (NICODERM CQ - dosed in mg/24 hours) patch 21 mg  21 mg Transdermal Daily PRN  Hinda Kehr, MD   21 mg at 03/03/16 2012  . ondansetron (ZOFRAN) tablet 4 mg  4 mg Oral Q6H PRN Debby Crosley, MD       Or  . ondansetron (ZOFRAN) injection  4 mg  4 mg Intravenous Q6H PRN Debby Crosley, MD      . pantoprazole (PROTONIX) EC tablet 40 mg  40 mg Oral QAC breakfast Debby Crosley, MD   40 mg at 03/04/16 0852  . senna-docusate (Senokot-S) tablet 1 tablet  1 tablet Oral QHS PRN Debby Crosley, MD      . tamsulosin (FLOMAX) capsule 0.4 mg  0.4 mg Oral Daily Debby Crosley, MD   0.4 mg at 03/04/16 1015  . thiamine (VITAMIN B-1) tablet 100 mg  100 mg Oral Daily Hinda Kehr, MD   100 mg at 03/04/16 1015   Or  . thiamine (B-1) injection 100 mg  100 mg Intravenous Daily Hinda Kehr, MD        Musculoskeletal: Strength & Muscle Tone: decreased Gait & Station: unable to stand Patient leans: N/A  Psychiatric Specialty Exam: Physical Exam  Nursing note and vitals reviewed. Constitutional: He appears well-nourished. He appears lethargic. He appears cachectic.  HENT:  Head: Normocephalic and atraumatic.  Eyes: Conjunctivae are normal. Pupils are equal, round, and reactive to light.  Neck: Normal range of motion.  Cardiovascular: Normal heart sounds.   Respiratory: Effort normal.  GI: Soft.  Musculoskeletal: Normal range of motion.  Neurological: He appears lethargic.  Skin: Skin is warm and dry.  Psychiatric: His affect is blunt. His speech is delayed. He is slowed. Cognition and memory are impaired. He expresses impulsivity. He exhibits a depressed mood. He expresses no suicidal ideation. He exhibits abnormal recent memory.    Review of Systems  Constitutional: Positive for weight loss and malaise/fatigue.  HENT: Negative.   Eyes: Negative.   Respiratory: Negative.   Cardiovascular: Negative.   Gastrointestinal: Negative.   Musculoskeletal: Positive for falls.  Skin: Negative.   Neurological: Positive for weakness.  Psychiatric/Behavioral: Positive for depression, memory loss and substance abuse. Negative for suicidal ideas and hallucinations. The patient is nervous/anxious and has insomnia.     Blood pressure 96/58, pulse 62,  temperature 97.5 F (36.4 C), temperature source Oral, resp. rate 16, height 6' (1.829 m), weight 57.788 kg (127 lb 6.4 oz), SpO2 98 %.Body mass index is 17.27 kg/(m^2).  General Appearance: Disheveled  Eye Contact:  Minimal  Speech:  Garbled and Slow  Volume:  Decreased  Mood:  Depressed  Affect:  Depressed  Thought Process:  Descriptions of Associations: Tangential  Orientation:  Full (Time, Place, and Person)  Thought Content:  Tangential  Suicidal Thoughts:  No  Homicidal Thoughts:  No  Memory:  Immediate;   Fair Recent;   Fair Remote;   Fair  Judgement:  Impaired  Insight:  Shallow  Psychomotor Activity:  Decreased  Concentration:  Concentration: Fair  Recall:  [employer] of Knowledge:  Fair  Language:  Fair  Akathisia:  No  Handed:  Right  AIMS (if indicated):     Assets:  Housing Social Support  ADL's:  Impaired  Cognition:  Impaired,  Mild  Sleep:        Treatment Plan Summary: Medication management and Plan 65 year old man with weight loss and hyponatremia. Heavy alcohol abuse. In the hospital for malnutrition hyponatremia and failure to thrive. Full workup proceeding. Patient right now does not appear to be delirious but does appear to be very depressed.  I suggest we start him on mirtazapine starting at 15 mg at night for depression. This will help with appetite and sleep as well. Patient agrees to the plan. I will follow-up during the hospital stay.  Disposition: Supportive therapy provided about ongoing stressors.  Alethia Berthold, MD 03/04/2016 5:04 PM

## 2016-03-04 NOTE — Care Management Important Message (Signed)
Important Message  Patient Details  Name: Luis SatoMichael [name] MRN: [id number] Date of Birth: 06/23/1951   Medicare Important Message Given:  Yes    Gwenette GreetBrenda S Loukisha Gunnerson, RN 03/04/2016, 8:34 AM

## 2016-03-04 NOTE — Progress Notes (Signed)
PT Cancellation Note  Patient Details Name: Luis SatoMichael [name] MRN: [id number] DOB: 02/02/1951   Cancelled Treatment:    Reason Eval/Treat Not Completed: Other (comment). Consult received and chart reviewed. Attempted to work with pt, however he adamantly refuses and just wants to watch tv. Copious attempts to reason with pt, however he still continues to refuse despite admitting he is "weak all over" and should have therapy. He is agreeable to work with therapy next date. Will re-attempt tomorrow.   Afrah Burlison 03/04/2016, 11:07 AM  Elizabeth PalauStephanie Celso Granja, PT, DPT [phone]

## 2016-03-04 NOTE — Care Management (Signed)
Admitted to [facility] with the diagnosis of hyponatremia. Lives at [facility] x 2.5 years.  Ex-wife is Inetta Fermoina [phone]). Daughter is Scientist, water qualityKerry Surgeon [phone]). Discharged from this facility 12/09/15. Taxi voucher issued at that time. Declined to [facility] in March and today. Telephone call to Vernona [facility]gerLaura at [facility]. Mr. Toma AranLauer's primary care physician is Dr. [place]er in [place] and is case worker is Wyline Copasebra Phillips. Been to [facility] for care in the past. Will fax [facility]  Given information in the past for alcohol abuse. [facility] past. Uses [facility] for transportation.  CIWA protocol continues. Gwenette GreetBrenda S Daylah Sayavong RN MSN CCM Care Management [phone]

## 2016-03-04 NOTE — Progress Notes (Addendum)
Initial Nutrition Assessment  DOCUMENTATION CODES:   Severe malnutrition in context of social or environmental circumstances  INTERVENTION:   Cater to pt preferences to optimize nutritional intake. Recommend Regular diet order. Agree with Ensure Enlive po BID, each supplement provides 350 kcal and 20 grams of protein.   NUTRITION DIAGNOSIS:   Malnutrition related to social / environmental circumstances as evidenced by severe depletion of muscle mass, moderate depletion of body fat, percent weight loss, energy intake < or equal to 50% for > or equal to 1 month.  GOAL:   Patient will meet greater than or equal to 90% of their needs  MONITOR:   PO intake, Supplement acceptance, Labs, Weight trends, I & O's, Skin  REASON FOR ASSESSMENT:   Malnutrition Screening Tool    ASSESSMENT:   Pt admitted with dehydration and hyponatremia with EtOH use, currently on CIWA.  Past Medical History  Diagnosis Date  . GERD (gastroesophageal reflux disease)   . Alcohol abuse     Diet Order:  Diet regular Room service appropriate?: Yes; Fluid consistency:: Thin   Pt reports trying to eat some this morning.  Pt reports very poor po intake for the past 6-8 weeks. Pt reports very plainly that he has been drinking instead of eating.     Medications: folic acid, MVI, KCl, thiamine, NS at 15900mL/hr, Protonix Labs: Na 122, 123, K 3.1  Gastrointestinal Profile: Last BM:  no BM documented   Nutrition-Focused Physical Exam Findings: Nutrition-Focused physical exam completed. Findings are mild-moderate fat depletion, moderate-severe muscle depletion, and no edema.     Weight Change: Pt reports weighing 160lbs, usually 175lbs. Pt last admitted 3 months ago upon chart review was weighing 142lbs then (11% weight loss in 3 months)   Skin:  Reviewed, no issues   Height:   Ht Readings from Last 1 Encounters:  03/03/16 6' (1.829 m)    Weight:   Wt Readings from Last 1 Encounters:   03/03/16 127 lb 6.4 oz (57.788 kg)   Wt Readings from Last 10 Encounters:  03/03/16 127 lb 6.4 oz (57.788 kg)  12/03/15 142 lb 6.4 oz (64.592 kg)  07/21/15 170 lb (77.111 kg)  06/18/15 170 lb (77.111 kg)    Ideal Body Weight:   86kg  BMI:  Body mass index is 17.27 kg/(m^2).  Estimated Nutritional Needs:   Kcal:  1740-2030kcals  Protein:  70-87g protein  Fluid:  >/= 1.8L fluid  EDUCATION NEEDS:   Education needs no appropriate at this time  Leda QuailAllyson Regene Mccarthy, RD, LDN Pager [phone] Weekend/On-Call Pager [phone]

## 2016-03-04 NOTE — Progress Notes (Signed)
[facility] [place] [facility] [place] [facility]   PATIENT NAME: Luis SatoMichael [name]    MR#:  [id number]  DATE OF BIRTH:  03/14/1951  SUBJECTIVE:  CHIEF COMPLAINT:   Chief Complaint  Patient presents with  . decreased PO intake   . Cough   - Admitted with multiple falls and weakness. Noted to be confused on admission. -Sodium noted to be low. Also history of alcohol use.  REVIEW OF SYSTEMS:  Review of Systems  Constitutional: Positive for malaise/fatigue. Negative for fever and chills.  HENT: Negative for ear discharge, ear pain and nosebleeds.   Eyes: Negative for blurred vision and double vision.  Respiratory: Negative for cough, shortness of breath and wheezing.   Cardiovascular: Negative for chest pain, palpitations and leg swelling.  Gastrointestinal: Negative for nausea, vomiting, abdominal pain, diarrhea and constipation.  Genitourinary: Negative for dysuria and urgency.  Musculoskeletal: Negative for myalgias, back pain and neck pain.  Neurological: Negative for dizziness, sensory change, speech change, focal weakness, seizures and headaches.  Psychiatric/Behavioral: Negative for depression.    DRUG ALLERGIES:  No Known Allergies  VITALS:  Blood pressure 96/58, pulse 62, temperature 97.5 F (36.4 C), temperature source Oral, resp. rate 16, height 6' (1.829 m), weight 57.788 kg (127 lb 6.4 oz), SpO2 98 %.  PHYSICAL EXAMINATION:  Physical Exam  GENERAL:  65 y.o.-year-old patient lying in the bed with no acute distress. Disheleved appearing EYES: Pupils equal, round, reactive to light and accommodation. No scleral icterus. Extraocular muscles intact.  HEENT: Head atraumatic, normocephalic. Oropharynx and nasopharynx clear.  NECK:  Supple, no jugular venous distention. No thyroid enlargement, no tenderness.  LUNGS: Normal breath sounds bilaterally, no wheezing, rales,rhonchi or crepitation. No use of accessory muscles of respiration.  CARDIOVASCULAR: S1, S2  normal. No murmurs, rubs, or gallops.  ABDOMEN: Soft, nontender, nondistended. Bowel sounds present. No organomegaly or mass.  EXTREMITIES: No pedal edema, cyanosis, or clubbing.  NEUROLOGIC: Cranial nerves II through XII are intact. Muscle strength 5/5 in all extremities. Sensation intact. Gait not checked. Global weakness noted PSYCHIATRIC: The patient is alert and oriented x 2.  SKIN: No obvious rash, lesion, or ulcer.    LABORATORY PANEL:   CBC  Recent Labs Lab 03/04/16 0417  WBC 9.2  HGB 12.1*  HCT 34.5*  PLT 122*   ------------------------------------------------------------------------------------------------------------------  Chemistries   Recent Labs Lab 03/03/16 1823 03/03/16 1827  03/04/16 0417 03/04/16 1045  NA  --  123*  < > 123* 122*  K  --  3.6  < > 3.1*  --   CL  --  90*  < > 92*  --   CO2  --  23  < > 25  --   GLUCOSE  --  114*  < > 100*  --   BUN  --  13  < > 17  --   CREATININE  --  0.90  < > 0.81  --   CALCIUM  --  8.5*  < > 7.9*  --   MG 1.8  --   --   --   --   AST  --  83*  --   --   --   ALT  --  60  --   --   --   ALKPHOS  --  161*  --   --   --   BILITOT  --  0.6  --   --   --   < > = values in this interval  not displayed. ------------------------------------------------------------------------------------------------------------------  Cardiac Enzymes No results for input(s): TROPONINI in the last 168 hours. ------------------------------------------------------------------------------------------------------------------  RADIOLOGY:  Ct Head Wo Contrast  03/03/2016  CLINICAL DATA:  Fall.  Initial encounter. EXAM: CT HEAD WITHOUT CONTRAST TECHNIQUE: Contiguous axial images were obtained from the base of the skull through the vertex without intravenous contrast. COMPARISON:  None. FINDINGS: Skull and Sinuses:Negative for acute fracture or hemo sinus. Small volume fluid within right mastoid air cells, likely inflammatory. Clear nasopharynx.  Minimal fluid layering within the right sphenoid sinus. Visualized orbits: No acute finding. Punctate calcification near the medial canthus on the left. Brain: No evidence of acute infarction, hemorrhage, hydrocephalus, or mass lesion/mass effect. Generalized atrophy with mild predilection for the cerebellum in this patient with alcohol abuse. 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 IMPRESSION: 1. Negative for intracranial injury or fracture. 2. Small right mastoid effusion. Electronically Signed   By: Marnee Spring M.D.   On: 03/03/2016 21:05   Dg Chest Portable 1 View  03/03/2016  CLINICAL DATA:  65 year old male new with cough and possible aspiration. EXAM: PORTABLE CHEST 1 VIEW COMPARISON:  Radiograph dated 06/18/2015 FINDINGS: The heart size and mediastinal contours are within normal limits. Both lungs are clear. The visualized skeletal structures are unremarkable. IMPRESSION: No active disease. Electronically Signed   By: Elgie Collard M.D.   On: 03/03/2016 20:14    EKG:   Orders placed or performed during the hospital encounter of 03/03/16  . ED EKG  . ED EKG  . EKG 12-Lead  . EKG 12-Lead    ASSESSMENT AND PLAN:   Extensive 65-year-old male with past medical history significant for hypertension, alcohol use, GERD was presented to the hospital secondary to confusion, weight loss and falls. Noted to be hyponatremic on presentation.  #1 hyponatremia- probably hypovolemia and also beer potomania - continue IVNS and recheck sodium every 6hrs, if no improvement- nephrology consult -? SIADH, serum and urine osm ordered  #2 Hypokalemia- replaced, pharmacy to manage electrolytes  #3 acute metabolic encephalopathy-[place] hyponatremia. CT of the head with no acute findings. Continue to monitor.  #4 alcohol use- serum  alcohol level was negative on admission. Continue CIWA protocol  #5 depression and anxiety-started on Lexapro. Psychiatry consulted  #6 deconditioning with  falls-after the sodium is improved, will need PT consult  #7 HTN- metoprolol and norvasc  #8 DVT Prophylaxis- lovenox   All the records are reviewed and case discussed with Care Management/Social Workerr. Management plans discussed with the patient, family and they are in agreement.  CODE STATUS: Full Code  TOTAL TIME TAKING CARE OF THIS PATIENT: 37 minutes.   POSSIBLE D/C IN 2 DAYS, DEPENDING ON CLINICAL CONDITION.   Enid Baas M.D on 03/04/2016 at 3:16 PM  Between 7am to 6pm - Pager - [phone]  After 6pm go to [email or web address] - password [email or web address] [facility]  [place]  [facility]  Office  [phone]  CC: Primary care physician; No PCP Per Patient

## 2016-03-04 NOTE — Progress Notes (Signed)
Per patient ok to give info to sister in law Normajean GlasgowMarie Faulk.

## 2016-03-04 NOTE — Progress Notes (Signed)
  Pharmacy Consult for Electrolyte monitoring/replacement  No Known Allergies    Labs:  Recent Labs  03/03/16 1827 03/04/16 0025 03/04/16 0417  WBC 12.3* 10.8* 9.2  HGB 14.8 13.1 12.1*  HCT 43.4 37.6* 34.5*  PLT 153 127* 122*     Recent Labs  03/03/16 1823  03/03/16 1827 03/04/16 0025 03/04/16 0417 03/04/16 1045  NA  --   < > 123* 122* 123* 122*  K  --   --  3.6 3.6 3.1*  --   CL  --   --  90* 89* 92*  --   CO2  --   --  23 25 25   --   GLUCOSE  --   --  114* 107* 100*  --   BUN  --   --  13 16 17   --   CREATININE  --   --  0.90 0.97 0.81  --   CALCIUM  --   --  8.5* 8.2* 7.9*  --   MG 1.8  --   --   --   --   --   PROT  --   --  5.8*  --   --   --   ALBUMIN  --   --  3.0*  --   --   --   AST  --   --  83*  --   --   --   ALT  --   --  60  --   --   --   ALKPHOS  --   --  161*  --   --   --   BILITOT  --   --  0.6  --   --   --   < > = values in this interval not displayed. Estimated Creatinine Clearance: 74.3 mL/min (by C-G formula based on Cr of 0.81).   No results for input(s): GLUCAP in the last 72 hours.  Medical History: Past Medical History  Diagnosis Date  . GERD (gastroesophageal reflux disease)   . Alcohol abuse     Assessment: Patient admitted for hyponatremia, EtOH abuse. Potassium low, pharmacy consulted to monitor and replace electrolytes  Plan:  Received 80mEq oral potassium, no additional supplementation today. Will recheck electrolytes with AM labs.  Luis [name] C 03/04/2016,3:14 PM

## 2016-03-05 LAB — BASIC METABOLIC PANEL
Anion gap: 5 (ref 5–15)
BUN: 14 mg/dL (ref 6–20)
CALCIUM: 7.6 mg/dL — AB (ref 8.9–10.3)
CHLORIDE: 100 mmol/L — AB (ref 101–111)
CO2: 22 mmol/L (ref 22–32)
CREATININE: 0.78 mg/dL (ref 0.61–1.24)
GFR calc Af Amer: >60 mL/min (ref >60)
GFR calc non Af Amer: >60 mL/min (ref >60)
GLUCOSE: 93 mg/dL (ref 65–99)
Potassium: 4.2 mmol/L (ref 3.5–5.1)
Sodium: 127 mmol/L — ABNORMAL LOW (ref 135–145)

## 2016-03-05 LAB — SODIUM: SODIUM: 129 mmol/L — AB (ref 135–145)

## 2016-03-05 LAB — MAGNESIUM: Magnesium: 1.6 mg/dL — ABNORMAL LOW (ref 1.7–2.4)

## 2016-03-05 LAB — PHOSPHORUS: PHOSPHORUS: 2.3 mg/dL — AB (ref 2.5–4.6)

## 2016-03-05 MED ORDER — K PHOS MONO-SOD PHOS DI & MONO 155-852-130 MG PO TABS
500.0000 mg | ORAL_TABLET | ORAL | Status: AC
  Administered 2016-03-05 (×2): 500 mg via ORAL
  Filled 2016-03-05 (×2): qty 2

## 2016-03-05 MED ORDER — CHLORDIAZEPOXIDE HCL 10 MG PO CAPS
10.0000 mg | ORAL_CAPSULE | Freq: Three times a day (TID) | ORAL | Status: DC
  Administered 2016-03-05 – 2016-03-07 (×7): 10 mg via ORAL
  Filled 2016-03-05 (×7): qty 1

## 2016-03-05 MED ORDER — MAGNESIUM SULFATE 2 GM/50ML IV SOLN
2.0000 g | Freq: Once | INTRAVENOUS | Status: AC
  Administered 2016-03-05: 08:00:00 2 g via INTRAVENOUS
  Filled 2016-03-05: qty 50

## 2016-03-05 NOTE — Plan of Care (Signed)
Problem: Skin Integrity: Goal: Risk for impaired skin integrity will decrease Outcome: Not Progressing Pt with areas of blanchable redness to sacrum and bilateral feet. Foam dressing in place. Pt has to be reminded and assisted with turning and repositioning. Poor po intake keeps pt at risk for impaired skin integrity as well. Seen by registered dietitian.

## 2016-03-05 NOTE — Progress Notes (Signed)
Pharmacy Consult for Electrolyte monitoring/replacement  No Known Allergies  Labs:  Recent Labs  03/03/16 1827 03/04/16 0025 03/04/16 0417  WBC 12.3* 10.8* 9.2  HGB 14.8 13.1 12.1*  HCT 43.4 37.6* 34.5*  PLT 153 127* 122*     Recent Labs  03/03/16 1823  03/03/16 1827 03/04/16 0025 03/04/16 0417  03/04/16 1921 03/05/16 0152 03/05/16 0728  NA  --   < > 123* 122* 123*  < > 124* 127* 129*  K  --   < > 3.6 3.6 3.1*  --   --  4.2  --   CL  --   < > 90* 89* 92*  --   --  100*  --   CO2  --   < > 23 25 25   --   --  22  --   GLUCOSE  --   < > 114* 107* 100*  --   --  93  --   BUN  --   < > 13 16 17   --   --  14  --   CREATININE  --   < > 0.90 0.97 0.81  --   --  0.78  --   CALCIUM  --   < > 8.5* 8.2* 7.9*  --   --  7.6*  --   MG 1.8  --   --   --   --   --   --  1.6*  --   PHOS  --   --   --   --   --   --   --  2.3*  --   PROT  --   --  5.8*  --   --   --   --   --   --   ALBUMIN  --   --  3.0*  --   --   --   --   --   --   AST  --   --  83*  --   --   --   --   --   --   ALT  --   --  60  --   --   --   --   --   --   ALKPHOS  --   --  161*  --   --   --   --   --   --   BILITOT  --   --  0.6  --   --   --   --   --   --   < > = values in this interval not displayed. Estimated Creatinine Clearance: 75.3 mL/min (by C-G formula based on Cr of 0.78).   No results for input(s): GLUCAP in the last 72 hours.  Medical History: Past Medical History  Diagnosis Date  . GERD (gastroesophageal reflux disease)   . Alcohol abuse     Assessment: Patient admitted for hyponatremia, EtOH abuse.  Pharmacy consulted to monitor and replace electrolytes 6/10  K = 4.2, Mag = 1.6, Phos = 2.3  Plan:  Patient has received Mag 2g IV this morning.  Will order K-Phos Neutral 2 tablets PO Q4H x 2 doses as per protocol.    Will recheck electrolytes with AM labs.  Stormy CardKatsoudas,Deionte Spivack K, RPh Clinical Pharmacist 03/05/2016,10:05 AM

## 2016-03-05 NOTE — Consult Note (Signed)
[place] [facility] Consult   Reason for Consult:  Follow Up.  Referring Physician:  Tressia Miners Patient Identification: Luis [name] MRN:  [id number] Principal Diagnosis: Severe recurrent major depression without psychotic features [facility]) Diagnosis:   Patient Active Problem List   Diagnosis Date Noted  . Protein-calorie malnutrition, severe [E43] 03/04/2016  . Severe recurrent major depression without psychotic features ([place]) [F33.2] 03/04/2016  . Alcohol abuse [F10.10] 12/07/2015  . Delirium tremens ([place]) [F10.231] 12/07/2015  . Malnutrition of moderate degree [E44.0] 12/04/2015  . Hyponatremia [E87.1] 06/18/2015    Total Time spent with patient: 30 minutes  Subjective:   Luis [name] is a 65 y.o. male patient admitted with "I've been falling down".  HPI:   Patient seen for follow-up. Physical therapy was also present in the room. Patient was sitting in the chair. He reported that he has been falling down recently and there appears to be rapid decline in his self-care. He reported that he has been drinking heavily and has been feeling depressed. When asked about his last use of alcohol he reported it was 1 week ago. He reported that he walks himself to the liquor store and there is an that lady who also brings him wine. He stated that he is not going to tell me about the lady. Patient reported that he does not eat much. He has been losing weight. He appears to be somewhat sedated during the interview and was tremulous. Staff also mentioned that he has been becoming too sedated on the medications. He currently denied having any withdrawal symptoms. He reported that he has history of blackouts in the past. Patient denied having any perceptual disturbances he denied having any suicidal homicidal ideations or plans.   Physical therapy is going to help him with his ADLs    Social history: Patient lives by himself. Has at least 1 adult daughter who is making some effort to look out for  him. Otherwise little social activity. He says he does keep some social contact with the people in his apartment complex.  Medical history: History of recurrent hyponatremia which is also part of his admission now. History of malnutrition. History of hypertension and gastric reflux discomfort.  Substance abuse history: Long history of alcohol abuse. Identified admissions for alcohol abuse probably about 8 years ago. He says he hasn't had a drink in about a week. He thinks he has never had a seizure from drinking in the past. Denies that he is abusing any other drugs. Says he gave that up years ago. In the past had done some outpatient substance abuse treatment but has not been keeping up with additional long time.  Past Psychiatric History: Patient denies that he's ever try to kill himself. He has had psychiatric hospitalization at our facility about 7 years ago although he claims not to remember much about it and the full note is not available. He was diagnosed with both depression and alcohol problems at that time. Has not been compliant with any recent outpatient psychiatric treatment. Denies any history of psychotic symptoms.  Risk to Self: Is patient at risk for suicide?: No Risk to Others:   Prior Inpatient Therapy:   Prior Outpatient Therapy:    Past Medical History:  Past Medical History  Diagnosis Date  . GERD (gastroesophageal reflux disease)   . Alcohol abuse     Past Surgical History  Procedure Laterality Date  . Cholecystectomy     Family History:  Family History  Problem Relation Age of Onset  .  Alcohol abuse Mother   . Alcohol abuse Father    Family Psychiatric  History: Positive for alcohol abuse denies any other mental health problems in his family Social History:  History  Alcohol Use  . 36.0 oz/week  . 60 Cans of beer per week     History  Drug Use No    Social History   Social History  . Marital Status: Legally Separated    Spouse Name: N/A  . Number of  Children: N/A  . Years of Education: N/A   Social History Main Topics  . Smoking status: Current Every Day Smoker -- 0.50 packs/day  . Smokeless tobacco: None  . Alcohol Use: 36.0 oz/week    60 Cans of beer per week  . Drug Use: No  . Sexual Activity: Not Asked   Other Topics Concern  . None   Social History Narrative   Additional Social History:    Allergies:  No Known Allergies  Labs:  Results for orders placed or performed during the hospital encounter of 03/03/16 (from the past 48 hour(s))  Urinalysis complete, with microscopic     Status: Abnormal   Collection Time: 03/03/16 12:28 PM  Result Value Ref Range   Color, Urine AMBER (A) YELLOW   APPearance HAZY (A) CLEAR   Glucose, UA NEGATIVE NEGATIVE mg/dL   Bilirubin Urine NEGATIVE NEGATIVE   Ketones, ur TRACE (A) NEGATIVE mg/dL   Specific Gravity, Urine 1.017 1.005 - 1.030   Hgb urine dipstick 2+ (A) NEGATIVE   pH 5.0 5.0 - 8.0   Protein, ur NEGATIVE NEGATIVE mg/dL   Nitrite NEGATIVE NEGATIVE   Leukocytes, UA 1+ (A) NEGATIVE   RBC / HPF 6-30 0 - 5 RBC/hpf   WBC, UA 6-30 0 - 5 WBC/hpf   Bacteria, UA RARE (A) NONE SEEN   Squamous Epithelial / LPF NONE SEEN NONE SEEN   Mucous PRESENT    Hyaline Casts, UA PRESENT   Magnesium     Status: None   Collection Time: 03/03/16  6:23 PM  Result Value Ref Range   Magnesium 1.8 1.7 - 2.4 mg/dL  Ethanol     Status: None   Collection Time: 03/03/16  6:27 PM  Result Value Ref Range   Alcohol, Ethyl (B) <5 <5 mg/dL    Comment:        LOWEST DETECTABLE LIMIT FOR SERUM ALCOHOL IS 5 mg/dL FOR MEDICAL PURPOSES ONLY   Lipase, blood     Status: None   Collection Time: 03/03/16  6:27 PM  Result Value Ref Range   Lipase 36 11 - 51 U/L  Comprehensive metabolic panel     Status: Abnormal   Collection Time: 03/03/16  6:27 PM  Result Value Ref Range   Sodium 123 (L) 135 - 145 mmol/L   Potassium 3.6 3.5 - 5.1 mmol/L   Chloride 90 (L) 101 - 111 mmol/L   CO2 23 22 - 32 mmol/L    Glucose, Bld 114 (H) 65 - 99 mg/dL   BUN 13 6 - 20 mg/dL   Creatinine, Ser 0.90 0.61 - 1.24 mg/dL   Calcium 8.5 (L) 8.9 - 10.3 mg/dL   Total Protein 5.8 (L) 6.5 - 8.1 g/dL   Albumin 3.0 (L) 3.5 - 5.0 g/dL   AST 83 (H) 15 - 41 U/L   ALT 60 17 - 63 U/L   Alkaline Phosphatase 161 (H) 38 - 126 U/L   Total Bilirubin 0.6 0.3 - 1.2 mg/dL   GFR calc  non Af Amer >60 >60 mL/min   GFR calc Af Amer >60 >60 mL/min    Comment: (NOTE) The eGFR has been calculated using the CKD EPI equation. This calculation has not been validated in all clinical situations. eGFR's persistently <60 mL/min signify possible Chronic Kidney Disease.    Anion gap 10 5 - 15  CBC     Status: Abnormal   Collection Time: 03/03/16  6:27 PM  Result Value Ref Range   WBC 12.3 (H) 3.8 - 10.6 K/uL   RBC 4.68 4.40 - 5.90 MIL/uL   Hemoglobin 14.8 13.0 - 18.0 g/dL   HCT 43.4 40.0 - 52.0 %   MCV 92.8 80.0 - 100.0 fL   MCH 31.5 26.0 - 34.0 pg   MCHC 34.0 32.0 - 36.0 g/dL   RDW 15.3 (H) 11.5 - 14.5 %   Platelets 153 150 - 440 K/uL  Osmolality     Status: Abnormal   Collection Time: 03/03/16  8:06 PM  Result Value Ref Range   Osmolality 258 (L) 275 - 295 mOsm/kg  CK     Status: Abnormal   Collection Time: 03/03/16  8:06 PM  Result Value Ref Range   Total CK 20 (L) 49 - 397 U/L  Basic metabolic panel     Status: Abnormal   Collection Time: 03/04/16 12:25 AM  Result Value Ref Range   Sodium 122 (L) 135 - 145 mmol/L   Potassium 3.6 3.5 - 5.1 mmol/L   Chloride 89 (L) 101 - 111 mmol/L   CO2 25 22 - 32 mmol/L   Glucose, Bld 107 (H) 65 - 99 mg/dL   BUN 16 6 - 20 mg/dL   Creatinine, Ser 0.97 0.61 - 1.24 mg/dL   Calcium 8.2 (L) 8.9 - 10.3 mg/dL   GFR calc non Af Amer >60 >60 mL/min   GFR calc Af Amer >60 >60 mL/min    Comment: (NOTE) The eGFR has been calculated using the CKD EPI equation. This calculation has not been validated in all clinical situations. eGFR's persistently <60 mL/min signify possible Chronic  Kidney Disease.    Anion gap 8 5 - 15  CBC     Status: Abnormal   Collection Time: 03/04/16 12:25 AM  Result Value Ref Range   WBC 10.8 (H) 3.8 - 10.6 K/uL   RBC 4.04 (L) 4.40 - 5.90 MIL/uL   Hemoglobin 13.1 13.0 - 18.0 g/dL   HCT 37.6 (L) 40.0 - 52.0 %   MCV 92.9 80.0 - 100.0 fL   MCH 32.5 26.0 - 34.0 pg   MCHC 35.0 32.0 - 36.0 g/dL   RDW 15.1 (H) 11.5 - 14.5 %   Platelets 127 (L) 150 - 440 K/uL  TSH     Status: Abnormal   Collection Time: 03/04/16  4:17 AM  Result Value Ref Range   TSH 4.904 (H) 0.350 - 4.500 uIU/mL  Basic metabolic panel     Status: Abnormal   Collection Time: 03/04/16  4:17 AM  Result Value Ref Range   Sodium 123 (L) 135 - 145 mmol/L   Potassium 3.1 (L) 3.5 - 5.1 mmol/L   Chloride 92 (L) 101 - 111 mmol/L   CO2 25 22 - 32 mmol/L   Glucose, Bld 100 (H) 65 - 99 mg/dL   BUN 17 6 - 20 mg/dL   Creatinine, Ser 0.81 0.61 - 1.24 mg/dL   Calcium 7.9 (L) 8.9 - 10.3 mg/dL   GFR calc non Af Amer >60 >60 mL/min  GFR calc Af Amer >60 >60 mL/min    Comment: (NOTE) The eGFR has been calculated using the CKD EPI equation. This calculation has not been validated in all clinical situations. eGFR's persistently <60 mL/min signify possible Chronic Kidney Disease.    Anion gap 6 5 - 15  CBC     Status: Abnormal   Collection Time: 03/04/16  4:17 AM  Result Value Ref Range   WBC 9.2 3.8 - 10.6 K/uL   RBC 3.71 (L) 4.40 - 5.90 MIL/uL   Hemoglobin 12.1 (L) 13.0 - 18.0 g/dL   HCT 34.5 (L) 40.0 - 52.0 %   MCV 93.1 80.0 - 100.0 fL   MCH 32.5 26.0 - 34.0 pg   MCHC 34.9 32.0 - 36.0 g/dL   RDW 15.2 (H) 11.5 - 14.5 %   Platelets 122 (L) 150 - 440 K/uL  Sodium     Status: Abnormal   Collection Time: 03/04/16 10:45 AM  Result Value Ref Range   Sodium 122 (L) 135 - 145 mmol/L  Ammonia     Status: None   Collection Time: 03/04/16 10:45 AM  Result Value Ref Range   Ammonia 19 9 - 35 umol/L  Urine Drug Screen, Qualitative ([facility] only)     Status: Abnormal   Collection Time:  03/04/16 12:30 PM  Result Value Ref Range   Tricyclic, Ur Screen NONE DETECTED NONE DETECTED   Amphetamines, Ur Screen NONE DETECTED NONE DETECTED   MDMA (Ecstasy)Ur Screen NONE DETECTED NONE DETECTED   Cocaine Metabolite,Ur [place] NONE DETECTED NONE DETECTED   Opiate, Ur Screen NONE DETECTED NONE DETECTED   Phencyclidine (PCP) Ur S NONE DETECTED NONE DETECTED   Cannabinoid 50 Ng, Ur [place] NONE DETECTED NONE DETECTED   Barbiturates, Ur Screen NONE DETECTED NONE DETECTED   Benzodiazepine, Ur Scrn POSITIVE (A) NONE DETECTED   Methadone Scn, Ur NONE DETECTED NONE DETECTED    Comment: (NOTE) 962  Tricyclics, urine               Cutoff 1000 ng/mL 200  Amphetamines, urine             Cutoff 1000 ng/mL 300  MDMA (Ecstasy), urine           Cutoff 500 ng/mL 400  Cocaine Metabolite, urine       Cutoff 300 ng/mL 500  Opiate, urine                   Cutoff 300 ng/mL 600  Phencyclidine (PCP), urine      Cutoff 25 ng/mL 700  Cannabinoid, urine              Cutoff 50 ng/mL 800  Barbiturates, urine             Cutoff 200 ng/mL 900  Benzodiazepine, urine           Cutoff 200 ng/mL 1000 Methadone, urine                Cutoff 300 ng/mL 1100 1200 The urine drug screen provides only a preliminary, unconfirmed 1300 analytical test result and should not be used for non-medical 1400 purposes. Clinical consideration and professional judgment should 1500 be applied to any positive drug screen result due to possible 1600 interfering substances. A more specific alternate chemical method 1700 must be used in order to obtain a confirmed analytical result.  1800 Gas chromato graphy / mass spectrometry (GC/MS) is the preferred 1900 confirmatory method.   Sodium  Status: Abnormal   Collection Time: 03/04/16  3:08 PM  Result Value Ref Range   Sodium 122 (L) 135 - 145 mmol/L  Sodium     Status: Abnormal   Collection Time: 03/04/16  7:21 PM  Result Value Ref Range   Sodium 124 (L) 135 - 145 mmol/L  Basic metabolic  panel     Status: Abnormal   Collection Time: 03/05/16  1:52 AM  Result Value Ref Range   Sodium 127 (L) 135 - 145 mmol/L   Potassium 4.2 3.5 - 5.1 mmol/L   Chloride 100 (L) 101 - 111 mmol/L   CO2 22 22 - 32 mmol/L   Glucose, Bld 93 65 - 99 mg/dL   BUN 14 6 - 20 mg/dL   Creatinine, Ser 0.78 0.61 - 1.24 mg/dL   Calcium 7.6 (L) 8.9 - 10.3 mg/dL   GFR calc non Af Amer >60 >60 mL/min   GFR calc Af Amer >60 >60 mL/min    Comment: (NOTE) The eGFR has been calculated using the CKD EPI equation. This calculation has not been validated in all clinical situations. eGFR's persistently <60 mL/min signify possible Chronic Kidney Disease.    Anion gap 5 5 - 15  Magnesium     Status: Abnormal   Collection Time: 03/05/16  1:52 AM  Result Value Ref Range   Magnesium 1.6 (L) 1.7 - 2.4 mg/dL  Phosphorus     Status: Abnormal   Collection Time: 03/05/16  1:52 AM  Result Value Ref Range   Phosphorus 2.3 (L) 2.5 - 4.6 mg/dL  Sodium     Status: Abnormal   Collection Time: 03/05/16  7:28 AM  Result Value Ref Range   Sodium 129 (L) 135 - 145 mmol/L    Current Facility-Administered Medications  Medication Dose Route Frequency Provider Last Rate Last Dose  . 0.9 %  sodium chloride infusion   Intravenous Continuous Debby Crosley, MD 100 mL/hr at 03/05/16 1016    . acetaminophen (TYLENOL) tablet 650 mg  650 mg Oral Q6H PRN Debby Crosley, MD   650 mg at 03/04/16 0636   Or  . acetaminophen (TYLENOL) suppository 650 mg  650 mg Rectal Q6H PRN Debby Crosley, MD      . amLODipine (NORVASC) tablet 5 mg  5 mg Oral Daily Debby Crosley, MD   5 mg at 03/04/16 1014  . antiseptic oral rinse (CPC / CETYLPYRIDINIUM CHLORIDE 0.05%) solution 7 mL  7 mL Mouth Rinse BID Debby Crosley, MD   7 mL at 03/04/16 2318  . chlordiazePOXIDE (LIBRIUM) capsule 25 mg  25 mg Oral TID Quintella Baton, MD   25 mg at 03/04/16 2317  . enoxaparin (LOVENOX) injection 40 mg  40 mg Subcutaneous Q24H Gladstone Lighter, MD   40 mg at 03/04/16 1935   . feeding supplement (ENSURE ENLIVE) (ENSURE ENLIVE) liquid 237 mL  237 mL Oral BID BM Debby Crosley, MD   237 mL at 03/04/16 1400  . folic acid (FOLVITE) tablet 1 mg  1 mg Oral Daily Hinda Kehr, MD   1 mg at 03/05/16 0754  . LORazepam (ATIVAN) tablet 0-4 mg  0-4 mg Oral Q6H Hinda Kehr, MD   Stopped at 03/04/16 1200   Followed by  . LORazepam (ATIVAN) tablet 0-4 mg  0-4 mg Oral Q12H Hinda Kehr, MD      . metoprolol tartrate (LOPRESSOR) tablet 25 mg  25 mg Oral BID Debby Crosley, MD   25 mg at 03/04/16 1014  . mirtazapine (REMERON)  tablet 15 mg  15 mg Oral QHS Gonzella Lex, MD   15 mg at 03/04/16 2317  . multivitamin with minerals tablet 1 tablet  1 tablet Oral Daily Hinda Kehr, MD   1 tablet at 03/04/16 1016  . nicotine (NICODERM CQ - dosed in mg/24 hours) patch 21 mg  21 mg Transdermal Daily PRN Hinda Kehr, MD   21 mg at 03/04/16 2015  . ondansetron (ZOFRAN) tablet 4 mg  4 mg Oral Q6H PRN Debby Crosley, MD       Or  . ondansetron (ZOFRAN) injection 4 mg  4 mg Intravenous Q6H PRN Debby Crosley, MD      . pantoprazole (PROTONIX) EC tablet 40 mg  40 mg Oral QAC breakfast Debby Crosley, MD   40 mg at 03/05/16 0753  . phosphorus (K PHOS NEUTRAL) tablet 500 mg  500 mg Oral Q4H Gladstone Lighter, MD      . senna-docusate (Senokot-S) tablet 1 tablet  1 tablet Oral QHS PRN Debby Crosley, MD      . tamsulosin (FLOMAX) capsule 0.4 mg  0.4 mg Oral Daily Debby Crosley, MD   0.4 mg at 03/05/16 0754  . thiamine (VITAMIN B-1) tablet 100 mg  100 mg Oral Daily Hinda Kehr, MD   100 mg at 03/05/16 4982   Or  . thiamine (B-1) injection 100 mg  100 mg Intravenous Daily Hinda Kehr, MD        Musculoskeletal: Strength & Muscle Tone: decreased Gait & Station: unable to stand Patient leans: N/A  Psychiatric Specialty Exam: Physical Exam  Nursing note and vitals reviewed. Constitutional: He appears well-nourished. He appears lethargic. He appears cachectic.  HENT:  Head: Normocephalic and  atraumatic.  Eyes: Conjunctivae are normal. Pupils are equal, round, and reactive to light.  Neck: Normal range of motion.  Cardiovascular: Normal heart sounds.   Respiratory: Effort normal.  GI: Soft.  Musculoskeletal: Normal range of motion.  Neurological: He appears lethargic.  Skin: Skin is warm and dry.  Psychiatric: His affect is blunt. His speech is delayed. He is slowed. Cognition and memory are impaired. He expresses impulsivity. He exhibits a depressed mood. He expresses no suicidal ideation. He exhibits abnormal recent memory.    Review of Systems  Constitutional: Positive for weight loss and malaise/fatigue.  HENT: Negative.   Eyes: Negative.   Respiratory: Negative.   Cardiovascular: Negative.   Gastrointestinal: Negative.   Musculoskeletal: Positive for falls.  Skin: Negative.   Neurological: Positive for weakness.  Psychiatric/Behavioral: Positive for depression, memory loss and substance abuse. Negative for suicidal ideas and hallucinations. The patient is nervous/anxious and has insomnia.     Blood pressure 117/61, pulse 88, temperature 97.6 F (36.4 C), temperature source Axillary, resp. rate 16, height 6' (1.829 m), weight 127 lb 6.4 oz (57.788 kg), SpO2 98 %.Body mass index is 17.27 kg/(m^2).  General Appearance: Disheveled  Eye Contact:  Minimal  Speech:  Slow  Volume:  Decreased  Mood:  Depressed  Affect:  Blunt and Depressed  Thought Process:  Disorganized  Orientation:  Full (Time, Place, and Person)  Thought Content:  Tangential  Suicidal Thoughts:  No  Homicidal Thoughts:  No  Memory:  Immediate;   Fair Recent;   Fair Remote;   Fair  Judgement:  Impaired  Insight:  Shallow  Psychomotor Activity:  Decreased  Concentration:  Concentration: Fair  Recall:  [employer] of Knowledge:  Fair  Language:  Fair  Akathisia:  No  Handed:  Right  AIMS (if indicated):     Assets:  Housing Social Support  ADL's:  Impaired  Cognition:  Impaired,  Mild   Sleep:        Treatment Plan Summary: Medication management   I will decrease his Librium  10 mg by mouth 3 times a day He is also on CIWA protocol.  Discontinue Lexapro Continue Remeron 15 mg by mouth daily at bedtime Patient will be monitored closely by the staff We'll continue to follow as needed  Thank you for allowing me to participate in the care of this patient  Disposition: Supportive therapy provided about ongoing stressors.  Rainey Pines, MD 03/05/2016 10:20 AM

## 2016-03-05 NOTE — Evaluation (Signed)
Physical Therapy Evaluation Patient Details Name: Bryne Lindon MRN: [id number] DOB: 1950-11-04 Today's Date: 03/05/2016   History of Present Illness  65 yo male with onset of metabolic encephalopathy and hypokalemia, malnutrition.  His recent history is falling, major weight loss of 40# in 2 months, depression and deconditioning.  PMHx:  EtOH abuse, failed rehab for alcohol, depression and mood changes.  Clinical Impression  Pt is demonstrating some instability with gait that is concerning but is just OOB after a lethargic time in the last couple days.  He is appropriate to keep on therapy and try to mobilize with PT and nursing aggressively to get home but not clear what things are in place for his supervision.  HHPT can follow him to see that he is strengthening at home and will be able to get around safely, as well as following up with MD to allow for medical intervention for substance abuse if pt is willing to try again.    Follow Up Recommendations Home health PT;Supervision for mobility/OOB    Equipment Recommendations  Rolling walker with 5" wheels    Recommendations for Other Services Rehab consult     Precautions / Restrictions Precautions Precautions: Fall Restrictions Weight Bearing Restrictions: No      Mobility  Bed Mobility Overal bed mobility: Needs Assistance Bed Mobility: Supine to Sit     Supine to sit: Min guard;Min assist     General bed mobility comments: using bedrail and needs minor help to fully sit up bedside  Transfers Overall transfer level: Needs assistance Equipment used: Rolling walker (2 wheeled);1 person hand held assist Transfers: Sit to/from [employer] Sit to Stand: Min assist;Min guard Stand pivot transfers: Min guard       General transfer comment: cued hand placement which pt did not follow   Ambulation/Gait Ambulation/Gait assistance: Min guard;Min assist Ambulation Distance (Feet): 4 Feet Assistive device:  Rolling walker (2 wheeled);1 person hand held assist Gait Pattern/deviations: Step-to pattern;Trunk flexed;Wide base of support (awkward guidance of walker) Gait velocity: slow halting Gait velocity interpretation: Below normal speed for age/gender General Gait Details: Pt is weak and needs significant help to get walker to chair, although mainly cues and not as much weight bearing help  Stairs            Wheelchair Mobility    Modified Rankin (Stroke Patients Only)       Balance Overall balance assessment: Needs assistance;History of Falls Sitting-balance support: Feet supported Sitting balance-Leahy Scale: Good   Postural control: Posterior lean Standing balance support: Bilateral upper extremity supported Standing balance-Leahy Scale: Fair Standing balance comment: fair- to move on walker                             Pertinent Vitals/Pain Pain Assessment: No/denies pain    Home Living Family/patient expects to be discharged to:: Private residence Living Arrangements: Alone Available Help at Discharge: Other (Comment) (Pt is not clearly able to elaborate on the help he has) Type of Home: Independent living facility Home Access: Stairs to enter Entrance Stairs-Rails: Can reach both;Left;Right Entrance Stairs-Number of Steps: 3 ("but I can avoid them with the elevator") Home Layout: One level Home Equipment: None Additional Comments: [facility] Independent Living    Prior Function Level of Independence: Independent         Comments: Pt recently has had multiple falls      Hand Dominance  Extremity/Trunk Assessment   Upper Extremity Assessment: Overall WFL for tasks assessed           Lower Extremity Assessment: Generalized weakness      Cervical / Trunk Assessment: Normal  Communication   Communication: HOH  Cognition Arousal/Alertness: Lethargic Behavior During Therapy: Flat affect;Agitated (agitated by psychiatrist's  questions) Overall Cognitive Status: No family/caregiver present to determine baseline cognitive functioning       Memory: Decreased short-term memory              General Comments General comments (skin integrity, edema, etc.): Pt is vague about the exact amount of help he has for home, has a friend who has purchased alcohol for him and not a clear history of help that could stay with him.    Exercises        Assessment/Plan    PT Assessment Patient needs continued PT services  PT Diagnosis Difficulty walking   PT Problem List Decreased strength;Decreased range of motion;Decreased activity tolerance;Decreased balance;Decreased mobility;Decreased coordination;Decreased cognition;Decreased knowledge of use of DME;Decreased safety awareness;Decreased knowledge of precautions  PT Treatment Interventions DME instruction;Gait training;Stair training;Functional mobility training;Therapeutic activities;Neuromuscular re-education;Balance training;Therapeutic exercise;Patient/family education   PT Goals (Current goals can be found in the Care Plan section) Acute Rehab PT Goals Patient Stated Goal: To be better PT Goal Formulation: With patient Time For Goal Achievement: 03/19/16 Potential to Achieve Goals: Good    Frequency Min 2X/week   Barriers to discharge Inaccessible home environment;Decreased caregiver support home alone with stairs at entrance    Co-evaluation               End of Session Equipment Utilized During Treatment: Gait belt Activity Tolerance: Patient limited by fatigue;Patient limited by lethargy Patient left: in chair;with call bell/phone within reach;with nursing/sitter in room;with chair alarm set Nurse Communication: Mobility status         Time: 1005-1033 PT Time Calculation (min) (ACUTE ONLY): 28 min   Charges:   PT Evaluation $PT Eval Moderate Complexity: 1 Procedure PT Treatments $Gait Training: 8-22 mins   PT G CodesIvar Drape:         Katessa Attridge E 03/05/2016, 1:43 PM    Samul Dadauth Shontia Gillooly, PT MS Acute Rehab Dept. Number: [facility] [phone] and [facility] [phone]

## 2016-03-05 NOTE — Progress Notes (Signed)
[facility] [place] at [facility]   PATIENT NAME: Luis [name]    MR#:  [id number]  DATE OF BIRTH:  08/13/1951  SUBJECTIVE:  No acute events overnight. Patient here with falls.  REVIEW OF SYSTEMS:    Review of Systems  Constitutional: Positive for weight loss. Negative for fever, chills and malaise/fatigue.  HENT: Negative for ear discharge, ear pain, hearing loss, nosebleeds and sore throat.   Eyes: Negative for blurred vision and pain.  Respiratory: Negative for cough, hemoptysis, shortness of breath and wheezing.   Cardiovascular: Negative for chest pain, palpitations and leg swelling.  Gastrointestinal: Negative for nausea, vomiting, abdominal pain, diarrhea and blood in stool.  Genitourinary: Negative for dysuria.  Musculoskeletal: Negative for back pain.  Neurological: Positive for weakness. Negative for dizziness, tremors, speech change, focal weakness, seizures and headaches.  Endo/Heme/Allergies: Does not bruise/bleed easily.  Psychiatric/Behavioral: Positive for depression and substance abuse. Negative for suicidal ideas and hallucinations.    Tolerating Diet:yes      DRUG ALLERGIES:  No Known Allergies  VITALS:  Blood pressure 117/61, pulse 88, temperature 97.6 F (36.4 C), temperature source Axillary, resp. rate 16, height 6' (1.829 m), weight 57.788 kg (127 lb 6.4 oz), SpO2 98 %.  PHYSICAL EXAMINATION:   Physical Exam  Constitutional: He is oriented to person, place, and time and well-developed, well-nourished, and in no distress. No distress.  HENT:  Head: Normocephalic.  Eyes: No scleral icterus.  Neck: Normal range of motion. Neck supple. No JVD present. No tracheal deviation present.  Cardiovascular: Normal rate, regular rhythm and normal heart sounds.  Exam reveals no gallop and no friction rub.   No murmur heard. Pulmonary/Chest: Effort normal and breath sounds normal. No respiratory distress. He has no wheezes. He has no rales.  He exhibits no tenderness.  Abdominal: Soft. Bowel sounds are normal. He exhibits no distension and no mass. There is no tenderness. There is no rebound and no guarding.  Musculoskeletal: Normal range of motion. He exhibits no edema.  Neurological: He is alert and oriented to person, place, and time.  Skin: Skin is warm. No rash noted. No erythema.  Psychiatric: Affect and judgment normal.      LABORATORY PANEL:   CBC  Recent Labs Lab 03/04/16 0417  WBC 9.2  HGB 12.1*  HCT 34.5*  PLT 122*   ------------------------------------------------------------------------------------------------------------------  Chemistries   Recent Labs Lab 03/03/16 1827  03/05/16 0152 03/05/16 0728  NA 123*  < > 127* 129*  K 3.6  < > 4.2  --   CL 90*  < > 100*  --   CO2 23  < > 22  --   GLUCOSE 114*  < > 93  --   BUN 13  < > 14  --   CREATININE 0.90  < > 0.78  --   CALCIUM 8.5*  < > 7.6*  --   MG  --   --  1.6*  --   AST 83*  --   --   --   ALT 60  --   --   --   ALKPHOS 161*  --   --   --   BILITOT 0.6  --   --   --   < > = values in this interval not displayed. ------------------------------------------------------------------------------------------------------------------  Cardiac Enzymes No results for input(s): TROPONINI in the last 168 hours. ------------------------------------------------------------------------------------------------------------------  RADIOLOGY:  Ct Head Wo Contrast  03/03/2016  CLINICAL DATA:  Fall.  Initial encounter. EXAM:  CT HEAD WITHOUT CONTRAST TECHNIQUE: Contiguous axial images were obtained from the base of the skull through the vertex without intravenous contrast. COMPARISON:  None. FINDINGS: Skull and Sinuses:Negative for acute fracture or hemo sinus. Small volume fluid within right mastoid air cells, likely inflammatory. Clear nasopharynx. Minimal fluid layering within the right sphenoid sinus. Visualized orbits: No acute finding. Punctate  calcification near the medial canthus on the left. Brain: No evidence of acute infarction, hemorrhage, hydrocephalus, or mass lesion/mass effect. Generalized atrophy with mild predilection for the cerebellum in this patient with alcohol abuse. 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 IMPRESSION: 1. Negative for intracranial injury or fracture. 2. Small right mastoid effusion. Electronically Signed   By: Marnee Spring M.D.   On: 03/03/2016 21:05   Dg Chest Portable 1 View  03/03/2016  CLINICAL DATA:  65 year old male new with cough and possible aspiration. EXAM: PORTABLE CHEST 1 VIEW COMPARISON:  Radiograph dated 06/18/2015 FINDINGS: The heart size and mediastinal contours are within normal limits. Both lungs are clear. The visualized skeletal structures are unremarkable. IMPRESSION: No active disease. Electronically Signed   By: Elgie Collard M.D.   On: 03/03/2016 20:14     ASSESSMENT AND PLAN:   64 year old male with a history of EtOH, tobacco abuse and essential hypertension who presented with falls and weight loss.  1. Hyponatremia: This is due to hypovolemia and poor by mouth intake. Patient's main source of calories was EtOH. Sodium level is improving with IV fluids.  2. Hypokalemia: Improved with replacement.  3. Acute metabolic encephalopathy due to hyponatremia. This is improved.  4. EtOH use: Continue CIWA protocol.  5. Depression and anxiety: Appreciate psychiatry consultation. Continue Remeron.  6. Essential hypertension: Continue metoprolol and Norvasc.  7. Weight loss with severe protein calorie malnutrition: Continue ensure. This is due to poor diet.  8. Tobacco dependence: Continue nicotine patch.  9. Hypo-magnesium: Replete  10. Generalized weakness: Physical therapy consultation pending.   Management plans discussed with the patient and he is in agreement.  CODE STATUS: FULL  TOTAL TIME TAKING CARE OF THIS PATIENT: 27 minutes.     POSSIBLE  D/C tomrrow, DEPENDING ON CLINICAL CONDITION.   Sumner Kirchman M.D on 03/05/2016 at 11:34 AM  Between 7am to 6pm - Pager - [phone] After 6pm go to [email or web address] - Social research officer, government  [facility]  Office  [phone]  CC: Primary care physician; No PCP Per Patient  Note: This dictation was prepared with Dragon dictation along with smaller phrase technology. Any transcriptional errors that result from this process are unintentional.

## 2016-03-06 LAB — MAGNESIUM: Magnesium: 1.6 mg/dL — ABNORMAL LOW (ref 1.7–2.4)

## 2016-03-06 LAB — BASIC METABOLIC PANEL
ANION GAP: 5 (ref 5–15)
BUN: 10 mg/dL (ref 6–20)
CHLORIDE: 107 mmol/L (ref 101–111)
CO2: 21 mmol/L — AB (ref 22–32)
Calcium: 7.7 mg/dL — ABNORMAL LOW (ref 8.9–10.3)
Creatinine, Ser: 0.55 mg/dL — ABNORMAL LOW (ref 0.61–1.24)
GFR calc Af Amer: >60 mL/min (ref >60)
GLUCOSE: 94 mg/dL (ref 65–99)
POTASSIUM: 3.6 mmol/L (ref 3.5–5.1)
Sodium: 133 mmol/L — ABNORMAL LOW (ref 135–145)

## 2016-03-06 LAB — PHOSPHORUS: Phosphorus: 3.6 mg/dL (ref 2.5–4.6)

## 2016-03-06 MED ORDER — CHLORDIAZEPOXIDE HCL 10 MG PO CAPS: 10.0000 mg | ORAL_CAPSULE | Freq: Three times a day (TID) | ORAL | Status: DC

## 2016-03-06 MED ORDER — MAGNESIUM SULFATE 2 GM/50ML IV SOLN
2.0000 g | Freq: Once | INTRAVENOUS | Status: AC
  Administered 2016-03-06: 2 g via INTRAVENOUS
  Filled 2016-03-06: qty 50

## 2016-03-06 MED ORDER — POTASSIUM CHLORIDE CRYS ER 20 MEQ PO TBCR
20.0000 meq | EXTENDED_RELEASE_TABLET | Freq: Two times a day (BID) | ORAL | Status: AC
  Administered 2016-03-06 (×2): 20 meq via ORAL
  Filled 2016-03-06 (×2): qty 1

## 2016-03-06 MED ORDER — ENSURE ENLIVE PO LIQD: 237.0000 mL | Freq: Two times a day (BID) | ORAL | Status: DC

## 2016-03-06 MED ORDER — MIRTAZAPINE 15 MG PO TABS: 15.0000 mg | ORAL_TABLET | Freq: Every day | ORAL | Status: DC

## 2016-03-06 MED ORDER — NICOTINE 21 MG/24HR TD PT24: 21.0000 mg | MEDICATED_PATCH | Freq: Every day | TRANSDERMAL | Status: DC | PRN

## 2016-03-06 NOTE — Progress Notes (Signed)
Physical Therapy Treatment Patient Details Name: Luis [name] MRN: [id number] DOB: 08-05-1951 Today's Date: 03/06/2016    History of Present Illness 65 yo male with onset of metabolic encephalopathy and hypokalemia, malnutrition.  His recent history is falling, major weight loss of 40# in 2 months, depression and deconditioning.  PMHx:  EtOH abuse, failed rehab for alcohol, depression and mood changes.    PT Comments    Patient is progressing; He was able to walk from bathroom to bed with supervision; Patient does demonstrate some impaired safety awareness and is impulsive. For example, when getting up off the commode, he walked to sink without his walker. However patient did not loose his balance; He does demonstrate some difficulty with trunk control with static standing requiring some HHA; He is mod I  for bed mobility and supervision for transfers; He does demonstrate decreased hygiene having difficulty wiping self after having a bowel movement;  He would benefit from additional skilled PT intervention to improve strength and mobility; PT tried to encourage patient to walker farther however he reports increased fatigue. Patient reports, "I am not getting up out of this bed or walking in the hallway because I'm too tired." Based on today's session patient is at a supervision level. However his unwillingness to try walking a household distance makes it difficult to determine functional status.   Follow Up Recommendations  Home health PT;Supervision for mobility/OOB     Equipment Recommendations  Rolling walker with 5" wheels    Recommendations for Other Services Rehab consult     Precautions / Restrictions Precautions Precautions: Fall Restrictions Weight Bearing Restrictions: No    Mobility  Bed Mobility Overal bed mobility: Modified Independent Bed Mobility: Sit to Supine       Sit to supine: Modified independent (Device/Increase time)   General bed mobility comments: uses  bed rails; able to position self indepedently; able to scoot up in bed independently with bed rails;   Transfers Overall transfer level: Needs assistance Equipment used: Rolling walker (2 wheeled);1 person hand held assist Transfers: Sit to/from Stand Sit to Stand: Supervision         General transfer comment: Patient transferred sit<>Stand from low toilet with grab bar, supervision; He is supervision with 2 HHA on bar/RW during cleaning from toileting; Patient is impulsive and difficulty cleaning self efficiently;  Ambulation/Gait Ambulation/Gait assistance: Supervision Ambulation Distance (Feet): 12 Feet Assistive device: Rolling walker (2 wheeled) Gait Pattern/deviations: Step-through pattern;Decreased step length - right;Decreased step length - left;Shuffle;Wide base of support Gait velocity: decreased   General Gait Details: Patient is impulsive and requires supervision for safety with RW; He demonstrates forward flexed posture requiring cues to step into RW for safety; Patient ambulated to sink with RW and stepped away from RW demonstrating decreased safety awareness by stepping away from RW;    Stairs            Wheelchair Mobility    Modified Rankin (Stroke Patients Only)       Balance Overall balance assessment: Needs assistance Sitting-balance support: Feet supported Sitting balance-Leahy Scale: Good     Standing balance support: Bilateral upper extremity supported Standing balance-Leahy Scale: Fair Standing balance comment: Patient requires 2 HHA to keep balance in standing; demonstrates decreased weight shift and reaction time to maintain balance unsupported;                     Cognition Arousal/Alertness: Lethargic Behavior During Therapy: Agitated Overall Cognitive Status: No family/caregiver present to determine baseline  cognitive functioning       Memory: Decreased short-term memory              Exercises      General Comments         Pertinent Vitals/Pain Pain Assessment: No/denies pain    Home Living                      Prior Function            PT Goals (current goals can now be found in the care plan section) Acute Rehab PT Goals Patient Stated Goal: To be better PT Goal Formulation: With patient Time For Goal Achievement: 03/19/16 Potential to Achieve Goals: Good Progress towards PT goals: Progressing toward goals    Frequency  Min 2X/week    PT Plan Current plan remains appropriate    Co-evaluation             End of Session Equipment Utilized During Treatment: Gait belt Activity Tolerance: Patient limited by fatigue Patient left: in bed;with call bell/phone within reach;with bed alarm set     Time: 1520-1535 PT Time Calculation (min) (ACUTE ONLY): 15 min  Charges:  $Gait Training: 8-22 mins                    G Codes:      Luis [name] PT, DPT 03/06/2016, 3:52 PM

## 2016-03-06 NOTE — Care Management Note (Signed)
Case Management Note  Patient Details  Name: Leanna SatoMichael Teuscher MRN: [id number] Date of Birth: 04/27/1951  Subjective/Objective:    Received a call from Mr Toma AranLauer's ex-wife Inetta Fermoina ph: [phone] who states that Mr [name] lives alone and is incapable of caring for himself alone. She is requesting rehab until he is stronger. Mrs Inetta Fermoina stated that it could be a life threatening situation if Mr [name] fell at home and was unable to get up. This Clinical research associatewriter called Dr Juliene PinaMody and requested that she call Mrs Inetta Fermoina to discuss family concerns about discharge home today. Dr Juliene PinaMody called back and requested another PT evaluation by another physical therapist. This writer called [facility] in [facility] to request a second evaluation by another physical therapist, and entered Dr Camillia HerterMody's verbal order in EPIC. Thayer Ohmhris, RN on 1-A was updated. Social Worker was updated.                  Action/Plan:   Expected Discharge Date:                  Expected Discharge Plan:     In-House Referral:     Discharge planning Services     Post Acute Care Choice:    Choice offered to:     DME Arranged:    DME Agency:     HH Arranged:    HH Agency:     Status of Service:     Medicare Important Message Given:  Yes Date Medicare IM Given:    Medicare IM give by:    Date Additional Medicare IM Given:    Additional Medicare Important Message give by:     If discussed at Long Length of Stay Meetings, dates discussed:    Additional Comments:  Chanel Mcadams A, RN 03/06/2016, 2:07 PM

## 2016-03-06 NOTE — Progress Notes (Signed)
Pharmacy Consult for Electrolyte monitoring/replacement  No Known Allergies  Labs:  Recent Labs  03/03/16 1827 03/04/16 0025 03/04/16 0417  WBC 12.3* 10.8* 9.2  HGB 14.8 13.1 12.1*  HCT 43.4 37.6* 34.5*  PLT 153 127* 122*     Recent Labs  03/03/16 1823 03/03/16 1827  03/04/16 0417  03/05/16 0152 03/05/16 0728 03/06/16 0531  NA  --  123*  < > 123*  < > 127* 129* 133*  K  --  3.6  < > 3.1*  --  4.2  --  3.6  CL  --  90*  < > 92*  --  100*  --  107  CO2  --  23  < > 25  --  22  --  21*  GLUCOSE  --  114*  < > 100*  --  93  --  94  BUN  --  13  < > 17  --  14  --  10  CREATININE  --  0.90  < > 0.81  --  0.78  --  0.55*  CALCIUM  --  8.5*  < > 7.9*  --  7.6*  --  7.7*  MG 1.8  --   --   --   --  1.6*  --  1.6*  PHOS  --   --   --   --   --  2.3*  --  3.6  PROT  --  5.8*  --   --   --   --   --   --   ALBUMIN  --  3.0*  --   --   --   --   --   --   AST  --  83*  --   --   --   --   --   --   ALT  --  60  --   --   --   --   --   --   ALKPHOS  --  161*  --   --   --   --   --   --   BILITOT  --  0.6  --   --   --   --   --   --   < > = values in this interval not displayed. Estimated Creatinine Clearance: 75.3 mL/min (by C-G formula based on Cr of 0.55).   No results for input(s): GLUCAP in the last 72 hours.  Medical History: Past Medical History  Diagnosis Date  . GERD (gastroesophageal reflux disease)   . Alcohol abuse     Assessment: Patient admitted for hyponatremia, EtOH abuse.  Pharmacy consulted to monitor and replace electrolytes 6/10  K = 4.2, Mag = 1.6, Phos = 2.3  Plan:  K 3.6, Mg 1.6, phos 3.6. Ordered magnesium sulfate 2 gm IV x 1 and potassium chloride 20 mEq PO BID with meals x 2 doses. Will recheck electrolytes tomorrow with AM labs.   Carola FrostNathan A Nary Sneed, Pharm.D., BCPS Clinical Pharmacist 03/06/2016,6:35 AM

## 2016-03-06 NOTE — Care Management Note (Signed)
Case Management Note  Patient Details  Name: Luis [name] MRN: [id number] Date of Birth: 07/27/1951  Subjective/Objective:   Provided Mr Luis [name] with a list of home health providers from which he chose [facility]. A referral was faxed to [facility] requesting HH-PT, RN, Nurse Aide. Mr Luis [name] was provided with a rolling walker by [facility].                  Action/Plan:   Expected Discharge Date:                  Expected Discharge Plan:     In-House Referral:     Discharge planning Services     Post Acute Care Choice:    Choice offered to:     DME Arranged:    DME Agency:     HH Arranged:    HH Agency:     Status of Service:     Medicare Important Message Given:  Yes Date Medicare IM Given:    Medicare IM give by:    Date Additional Medicare IM Given:    Additional Medicare Important Message give by:     If discussed at Long Length of Stay Meetings, dates discussed:    Additional Comments:  Kayson Bullis A, RN 03/06/2016, 1:18 PM

## 2016-03-06 NOTE — Discharge Summary (Addendum)
[facility] - [place] at [facility]   PATIENT NAME: Luis SatoMichael [name]    MR#:  [id number]  DATE OF BIRTH:  04/09/1951  DATE OF ADMISSION:  03/03/2016 ADMITTING PHYSICIAN: Gery Prayebby Crosley, MD  DATE OF DISCHARGE: 03/08/2016  PRIMARY CARE PHYSICIAN: No PCP Per Patient    ADMISSION DIAGNOSIS:  Hyponatremia [E87.1] Malnutrition (HCC) [E46] Chronic alcohol abuse [F10.10] Failure to thrive in adult [R62.7] Inability to walk [R26.2]  DISCHARGE DIAGNOSIS:  Principal Problem:   Severe recurrent major depression without psychotic features (HCC) Active Problems:   Hyponatremia   Alcohol abuse   Protein-calorie malnutrition, severe   SECONDARY DIAGNOSIS:   Past Medical History  Diagnosis Date  . GERD (gastroesophageal reflux disease)   . Alcohol abuse     HOSPITAL COURSE:   65 year old male with a history of EtOH, tobacco abuse and essential hypertension who presented with falls and weight loss.  1. Hyponatremia: This was due to hypovolemia and poor by mouth intake. Patient's main source of calories was EtOH. Sodium level improved with IVF 2. Hypokalemia: Improved with replacement.  3. Acute metabolic encephalopathy due to hyponatremia. This has improved.  4. EtOH use: he was on librium taper and CIWA protocol. He is encouraged to stop drinking EtOh.  5. Depression and anxiety: Appreciate psychiatry consultation. He was started on  Remeron.  6. Essential hypertension: Continue metoprolol. Norvasc stopped due to low blood pressures at times.c.  7. Weight loss with severe protein calorie malnutrition: Continue Ensure. This is due to poor diet.  8. Tobacco dependence: Continue nicotine patch.  9. Hypo-magnesium: Repleted PRN  10. Generalized weakness: Physical therapy Is recommending home with home health care.   DISCHARGE CONDITIONS AND DIET:  Stable Heart healthy  CONSULTS OBTAINED:  Treatment Team:  Audery AmelJohn T Clapacs, MD  DRUG ALLERGIES:  No Known  Allergies  DISCHARGE MEDICATIONS:   Current Discharge Medication List    START taking these medications   Details  feeding supplement, ENSURE ENLIVE, (ENSURE ENLIVE) LIQD Take 237 mLs by mouth 2 (two) times daily between meals. Qty: 237 mL, Refills: 0    mirtazapine (REMERON) 30 MG tablet Take 1 tablet (30 mg total) by mouth at bedtime. Qty: 30 tablet, Refills: 0    nicotine (NICODERM CQ - DOSED IN MG/24 HOURS) 21 mg/24hr patch Place 1 patch (21 mg total) onto the skin daily as needed (if patient is current tobacco user and requests patch). Qty: 28 patch, Refills: 0      CONTINUE these medications which have NOT CHANGED   Details  tamsulosin (FLOMAX) 0.4 MG CAPS capsule Take 1 capsule (0.4 mg total) by mouth daily. Qty: 30 capsule, Refills: 2    esomeprazole (NEXIUM) 20 MG capsule Take 20 mg by mouth daily at 12 noon. Reported on 03/03/2016    metoprolol tartrate (LOPRESSOR) 25 MG tablet Take 1 tablet (25 mg total) by mouth 2 (two) times daily. Qty: 60 tablet, Refills: 2      STOP taking these medications     amLODipine (NORVASC) 5 MG tablet               Today   CHIEF COMPLAINT:    Ready to be discharged today. Patient without signs of withdrawal    VITAL SIGNS:  Blood pressure 112/64, pulse 86, temperature 98.1 F (36.7 C), temperature source Oral, resp. rate 17, height 6' (1.829 m), weight 57.788 kg (127 lb 6.4 oz), SpO2 97 %.   REVIEW OF SYSTEMS:  Review of Systems  Constitutional:  Negative for fever, chills and malaise/fatigue.  HENT: Negative for ear discharge, ear pain, hearing loss, nosebleeds and sore throat.   Eyes: Negative for blurred vision and pain.  Respiratory: Negative for cough, hemoptysis, shortness of breath and wheezing.   Cardiovascular: Negative for chest pain, palpitations and leg swelling.  Gastrointestinal: Negative for nausea, vomiting, abdominal pain, diarrhea and blood in stool.  Genitourinary: Negative for dysuria.   Musculoskeletal: Negative for back pain.  Neurological: Positive for weakness. Negative for dizziness, tremors, speech change, focal weakness, seizures and headaches.  Endo/Heme/Allergies: Does not bruise/bleed easily.  Psychiatric/Behavioral: Positive for depression and substance abuse. Negative for suicidal ideas and hallucinations.     PHYSICAL EXAMINATION:  GENERAL:  65 y.o.-year-old patient lying in the bed with no acute distress.  NECK:  Supple, no jugular venous distention. No thyroid enlargement, no tenderness. Poor dentition LUNGS: Normal breath sounds bilaterally, no wheezing, rales,rhonchi  No use of accessory muscles of respiration.  CARDIOVASCULAR: S1, S2 normal. No murmurs, rubs, or gallops.  ABDOMEN: Soft, non-tender, non-distended. Bowel sounds present. No organomegaly or mass.  EXTREMITIES: No pedal edema, cyanosis, or clubbing.  PSYCHIATRIC: The patient is alert and oriented x 3.  SKIN: No obvious rash, lesion, or ulcer.   DATA REVIEW:   CBC  Recent Labs Lab 03/04/16 0417  WBC 9.2  HGB 12.1*  HCT 34.5*  PLT 122*    Chemistries   Recent Labs Lab 03/03/16 1827  03/07/16 0416  NA 123*  < > 135  K 3.6  < > 3.8  CL 90*  < > 109  CO2 23  < > 22  GLUCOSE 114*  < > 116*  BUN 13  < > 9  CREATININE 0.90  < > 0.76  CALCIUM 8.5*  < > 7.9*  MG  --   < > 1.5*  AST 83*  --   --   ALT 60  --   --   ALKPHOS 161*  --   --   BILITOT 0.6  --   --   < > = values in this interval not displayed.  Cardiac Enzymes No results for input(s): TROPONINI in the last 168 hours.  Microbiology Results  @  RADIOLOGY:  No results found.    Management plans discussed with the patient and e is in agreement. Stable for discharge home with [facility]  Patient should follow up with Dr Marcello Fennel new PCP in 1 week  CODE STATUS:     Code Status Orders        Start     Ordered   03/03/16 2310  Full code   Continuous     03/03/16 2309    Code Status History     Date Active Date Inactive Code Status Order ID Comments User Context   12/03/2015  2:39 PM 12/09/2015 10:24 PM Full Code [id number]  Katha Hamming, MD ED   06/18/2015 10:38 AM 06/20/2015  2:52 PM Full Code [id number]  Alford Highland, MD ED      TOTAL TIME TAKING CARE OF THIS PATIENT: 35 minutes.    Note: This dictation was prepared with Dragon dictation along with smaller phrase technology. Any transcriptional errors that result from this process are unintentional.  Damen Windsor M.D on 03/08/2016  Between 7am to 6pm - Pager - [phone] After 6pm go to [email or web address] - password [email or web address]  [facility] [place] [facility]  Office  [phone]  CC: Primary care physician; Dr Hillis Range

## 2016-03-07 LAB — BASIC METABOLIC PANEL
Anion gap: 4 — ABNORMAL LOW (ref 5–15)
BUN: 9 mg/dL (ref 6–20)
CALCIUM: 7.9 mg/dL — AB (ref 8.9–10.3)
CO2: 22 mmol/L (ref 22–32)
CREATININE: 0.76 mg/dL (ref 0.61–1.24)
Chloride: 109 mmol/L (ref 101–111)
GFR calc Af Amer: >60 mL/min (ref >60)
GLUCOSE: 116 mg/dL — AB (ref 65–99)
Potassium: 3.8 mmol/L (ref 3.5–5.1)
SODIUM: 135 mmol/L (ref 135–145)

## 2016-03-07 LAB — PHOSPHORUS: Phosphorus: 3.2 mg/dL (ref 2.5–4.6)

## 2016-03-07 LAB — MAGNESIUM: MAGNESIUM: 1.5 mg/dL — AB (ref 1.7–2.4)

## 2016-03-07 MED ORDER — MIRTAZAPINE 15 MG PO TABS
30.0000 mg | ORAL_TABLET | Freq: Every day | ORAL | Status: DC
  Administered 2016-03-07: 30 mg via ORAL
  Filled 2016-03-07: qty 2

## 2016-03-07 MED ORDER — SODIUM CHLORIDE 0.9 % IV BOLUS (SEPSIS)
500.0000 mL | Freq: Once | INTRAVENOUS | Status: AC
  Administered 2016-03-07: 17:00:00 500 mL via INTRAVENOUS

## 2016-03-07 MED ORDER — MIRTAZAPINE 30 MG PO TABS: 30.0000 mg | ORAL_TABLET | Freq: Every day | ORAL | Status: DC

## 2016-03-07 MED ORDER — POTASSIUM CHLORIDE CRYS ER 20 MEQ PO TBCR
20.0000 meq | EXTENDED_RELEASE_TABLET | Freq: Once | ORAL | Status: AC
  Administered 2016-03-07: 07:00:00 20 meq via ORAL
  Filled 2016-03-07: qty 1

## 2016-03-07 MED ORDER — MAGNESIUM SULFATE 4 GM/100ML IV SOLN
4.0000 g | Freq: Once | INTRAVENOUS | Status: AC
  Administered 2016-03-07: 07:00:00 4 g via INTRAVENOUS
  Filled 2016-03-07: qty 100

## 2016-03-07 MED ORDER — ENSURE ENLIVE PO LIQD: 237.0000 mL | Freq: Two times a day (BID) | ORAL | Status: AC

## 2016-03-07 MED ORDER — NICOTINE 21 MG/24HR TD PT24: 21.0000 mg | MEDICATED_PATCH | Freq: Every day | TRANSDERMAL | Status: AC | PRN

## 2016-03-07 NOTE — Progress Notes (Signed)
Pharmacy Consult for Electrolyte monitoring/replacement  No Known Allergies  Labs: No results for input(s): WBC, HGB, HCT, PLT, APTT, INR in the last 72 hours.   Recent Labs  03/05/16 0152 03/05/16 0728 03/06/16 0531 03/07/16 0416  NA 127* 129* 133* 135  K 4.2  --  3.6 3.8  CL 100*  --  107 109  CO2 22  --  21* 22  GLUCOSE 93  --  94 116*  BUN 14  --  10 9  CREATININE 0.78  --  0.55* 0.76  CALCIUM 7.6*  --  7.7* 7.9*  MG 1.6*  --  1.6* 1.5*  PHOS 2.3*  --  3.6 3.2   Estimated Creatinine Clearance: 75.3 mL/min (by C-G formula based on Cr of 0.76).   No results for input(s): GLUCAP in the last 72 hours.  Medical History: Past Medical History  Diagnosis Date  . GERD (gastroesophageal reflux disease)   . Alcohol abuse     Assessment: Patient admitted for hyponatremia, EtOH abuse.  Pharmacy consulted to monitor and replace electrolytes 6/10  K = 4.2, Mag = 1.6, Phos = 2.3  Plan:  K 3.8, Mg 1.5, phos 3.2. Ordered magnesium sulfate 4 gm IV x 1 and potassium chloride 20 mEq PO once. Will recheck electrolytes tomorrow with AM labs.   Carola FrostNathan A Lurene Robley, Pharm.D., BCPS Clinical Pharmacist 03/07/2016,5:57 AM

## 2016-03-07 NOTE — Progress Notes (Signed)
[facility] - [place] at [facility]   PATIENT NAME: Luis SatoMichael [name]    MR#:  [id number]  DATE OF BIRTH:  03/08/1951  SUBJECTIVE:  No acute events overnight. Was ready for discharge but does not have ride home and [facility] REVIEW OF SYSTEMS:    Review of Systems  Constitutional: Positive for weight loss. Negative for fever, chills and malaise/fatigue.  HENT: Negative for ear discharge, ear pain, hearing loss, nosebleeds and sore throat.   Eyes: Negative for blurred vision and pain.  Respiratory: Negative for cough, hemoptysis, shortness of breath and wheezing.   Cardiovascular: Negative for chest pain, palpitations and leg swelling.  Gastrointestinal: Negative for nausea, vomiting, abdominal pain, diarrhea and blood in stool.  Genitourinary: Negative for dysuria.  Musculoskeletal: Negative for back pain.  Neurological: Positive for weakness. Negative for dizziness, tremors, speech change, focal weakness, seizures and headaches.  Endo/Heme/Allergies: Does not bruise/bleed easily.  Psychiatric/Behavioral: Positive for depression and substance abuse. Negative for suicidal ideas and hallucinations.    Tolerating Diet:yes      DRUG ALLERGIES:  No Known Allergies  VITALS:  Blood pressure 111/67, pulse 82, temperature 97.9 F (36.6 C), temperature source Oral, resp. rate 16, height 6' (1.829 m), weight 57.788 kg (127 lb 6.4 oz), SpO2 95 %.  PHYSICAL EXAMINATION:   Physical Exam  Constitutional: He is oriented to person, place, and time and well-developed, well-nourished, and in no distress. No distress.  HENT:  Head: Normocephalic.  Eyes: No scleral icterus.  Neck: Normal range of motion. Neck supple. No JVD present. No tracheal deviation present.  Cardiovascular: Normal rate, regular rhythm and normal heart sounds.  Exam reveals no gallop and no friction rub.   No murmur heard. Pulmonary/Chest: Effort normal and breath sounds normal. No  respiratory distress. He has no wheezes. He has no rales. He exhibits no tenderness.  Abdominal: Soft. Bowel sounds are normal. He exhibits no distension and no mass. There is no tenderness. There is no rebound and no guarding.  Musculoskeletal: Normal range of motion. He exhibits no edema.  Neurological: He is alert and oriented to person, place, and time.  Skin: Skin is warm. No rash noted. No erythema.  Psychiatric: Affect and judgment normal.      LABORATORY PANEL:   CBC  Recent Labs Lab 03/04/16 0417  WBC 9.2  HGB 12.1*  HCT 34.5*  PLT 122*   ------------------------------------------------------------------------------------------------------------------  Chemistries   Recent Labs Lab 03/03/16 1827  03/07/16 0416  NA 123*  < > 135  K 3.6  < > 3.8  CL 90*  < > 109  CO2 23  < > 22  GLUCOSE 114*  < > 116*  BUN 13  < > 9  CREATININE 0.90  < > 0.76  CALCIUM 8.5*  < > 7.9*  MG  --   < > 1.5*  AST 83*  --   --   ALT 60  --   --   ALKPHOS 161*  --   --   BILITOT 0.6  --   --   < > = values in this interval not displayed. ------------------------------------------------------------------------------------------------------------------  Cardiac Enzymes No results for input(s): TROPONINI in the last 168 hours. ------------------------------------------------------------------------------------------------------------------  RADIOLOGY:  No results found.   ASSESSMENT AND PLAN:   65 year old male with a history of EtOH, tobacco abuse and essential hypertension who presented with falls and weight loss.  1. Hyponatremia: This was due to hypovolemia and poor by mouth intake.  Patient's main source of calories was EtOH. Sodium level improved with IVF 2. Hypokalemia: Improved with replacement.  3. Acute metabolic encephalopathy due to hyponatremia. This has improved.  4. EtOH use: he was on librium taper and CIWA protocol. He is encouraged to stop drinking  EtOh.  5. Depression and anxiety: Appreciate psychiatry consultation. He was started on Remeron.  6. Essential hypertension: Continue metoprolol. Stop Norvasc due to low blood pressures at times..  7. Weight loss with severe protein calorie malnutrition: Continue Ensure. This is due to poor diet.  8. Tobacco dependence: Continue nicotine patch.  9. Hypo-magnesium: Repleted PRN  10. Generalized weakness: Physical therapy Is recommending home with home health care.  Management plans discussed with the patient and he is in agreement.  CODE STATUS: FULL  TOTAL TIME TAKING CARE OF THIS PATIENT: 27 minutes.     POSSIBLE D/C today DEPENDING ON CLINICAL CONDITION.   [name] M.D on 03/06/16 at 9:38 PM  Between 7am to 6pm - Pager - [phone] After 6pm go to [email or web address] - Social research officer, government  [facility]  Office  [phone]  CC: Primary care physician; No PCP Per Patient  Note: This dictation was prepared with Dragon dictation along with smaller phrase technology. Any transcriptional errors that result from this process are unintentional.

## 2016-03-07 NOTE — Care Management (Addendum)
Spoke with Mr. Luis [name] at the bedside. He is in agreement with going home with home health in place. Agreed with [facility] as the visiting agency. Feliberto GottronJason  Hinton, [facility] representative updated. Rolling walker will be arranged through [facility]. Spoke with [facility] Child psychotherapistsocial worker. Explained that there is no medical necessity for Mr. Luis [name] to be a patient in acute care. Also, explained that his insurance would not pay for rehabilitation.  Informed Ms. Green that a packet of information was given to Mr Luis [name] concerning alcohol and drug programs. The patient will need to start this process himself. The hospital doesn't arrange follow-up appointments for alcohol and drug programs.  Received telephone call from Ms. Luis [name]. Explained the above information again. Also informed the ex-wife that I had discussed this information with the social worker at [facility]. Gwenette GreetBrenda S Armina Galloway RN MSN CCM Care Management [phone]

## 2016-03-07 NOTE — Progress Notes (Signed)
Physical Therapy Treatment Patient Details Name: Luis SatoMichael [name] MRN: [id number] DOB: 06/19/1951 Today's Date: 03/07/2016    History of Present Illness 65 yo male with onset of metabolic encephalopathy and hypokalemia, malnutrition.  His recent history is falling, major weight loss of 40# in 2 months, depression and deconditioning.  PMHx:  EtOH abuse, failed rehab for alcohol, depression and mood changes.    PT Comments    Pt has concerns about going home regarding falling, cooking and taking care of self. Discussion with pt regarding PT role in determining discharge disposition and role of other staff in determining when pt ready for discharge. Pt notes he has no ride home today. Nursing contacted and reinforces same conversation with pt; nursing offers to contact MD on pt's behalf. Pt does demonstrate improved distance of ambulation and tolerance for stand exercises. Pt refuses practicing steps noting he uses an elevator. Pt does demonstrate some safety concerns with transfers (hand placement) and ambulation with occasional poor use of rolling walker. Recommend 24 hour assist and home health services to ensure safety within the home and progress strength and endurance to decrease risk of falling. Pt with current discharge order at this time.   Follow Up Recommendations  Home health PT;Supervision/Assistance - 24 hour     Equipment Recommendations  Rolling walker with 5" wheels (received already)    Recommendations for Other Services       Precautions / Restrictions Precautions Precautions: Fall Restrictions Weight Bearing Restrictions: No    Mobility  Bed Mobility Overal bed mobility: Modified Independent Bed Mobility: Supine to Sit;Sit to Supine     Supine to sit: Modified independent (Device/Increase time) Sit to supine: Modified independent (Device/Increase time)   General bed mobility comments: use of rails  Transfers Overall transfer level: Needs assistance Equipment  used: Rolling walker (2 wheeled);1 person hand held assist Transfers: Sit to/from Stand Sit to Stand: Supervision         General transfer comment: cues for safe hand placement  Ambulation/Gait Ambulation/Gait assistance: Min guard Ambulation Distance (Feet): 190 Feet Assistive device: Rolling walker (2 wheeled) Gait Pattern/deviations: Step-through pattern;Trunk flexed Gait velocity: decreased Gait velocity interpretation: Below normal speed for age/gender General Gait Details: occasionally walks out of confines of rw base primarily on turns requiring verbal correction/Min A. Pt also ambulates with rw too far out at time requirign correction.    Stairs Stairs:  (refuses steps; reports using elevator)          Wheelchair Mobility    Modified Rankin (Stroke Patients Only)       Balance Overall balance assessment: Needs assistance Sitting-balance support: Feet supported Sitting balance-Leahy Scale: Good     Standing balance support: Bilateral upper extremity supported Standing balance-Leahy Scale: Fair                      Cognition Arousal/Alertness: Awake/alert Behavior During Therapy: Anxious Overall Cognitive Status: No family/caregiver present to determine baseline cognitive functioning                      Exercises General Exercises - Lower Extremity Long Arc Quad: AROM;Both;20 reps;Seated Hip ABduction/ADduction: Strengthening;Both;Standing;10 reps Straight Leg Raises: Strengthening;Both;Standing;10 reps Hip Flexion/Marching: Strengthening;Both;20 reps;Standing Heel Raises: Strengthening;Both;10 reps;Standing Other Exercises Other Exercises: B hip extension 10x each    General Comments        Pertinent Vitals/Pain Pain Assessment: 0-10 Pain Score: 8  Pain Location: low bacjk    Home Living  Prior Function            PT Goals (current goals can now be found in the care plan section) Progress  towards PT goals: Progressing toward goals    Frequency  Min 2X/week    PT Plan Current plan remains appropriate;Discharge plan needs to be updated (recommend 24 hours versus intermittent)    Co-evaluation             End of Session Equipment Utilized During Treatment: Gait belt Activity Tolerance: Patient tolerated treatment well Patient left: in bed;with call bell/phone within reach;with bed alarm set     Time: 1411-1437 PT Time Calculation (min) (ACUTE ONLY): 26 min  Charges:  $Gait Training: 8-22 mins $Therapeutic Exercise: 8-22 mins                    G Codes:      Kristeen Miss, PTA 03/07/2016, 3:21 PM

## 2016-03-07 NOTE — Consult Note (Signed)
[place] Psychiatry Consult   Reason for Consult:  Consult for this 65 year old man brought in at the urging of his daughter because of a rapid decline in his self-care. Consult for depression Referring Physician:  Tressia Miners Patient Identification: Luis [name] MRN:  [id number] Principal Diagnosis: Severe recurrent major depression without psychotic features [facility]) Diagnosis:   Patient Active Problem List   Diagnosis Date Noted  . Protein-calorie malnutrition, severe [E43] 03/04/2016  . Severe recurrent major depression without psychotic features ([place]) [F33.2] 03/04/2016  . Alcohol abuse [F10.10] 12/07/2015  . Delirium tremens ([place]) [F10.231] 12/07/2015  . Malnutrition of moderate degree [E44.0] 12/04/2015  . Hyponatremia [E87.1] 06/18/2015    Total Time spent with patient: 1 hour  Subjective:   Luis [name] is a 65 y.o. male patient admitted with "I've been falling down".  Follow-up note for Monday, June 12. Patient seen. Chart reviewed. Also I spoke with his wife by telephone. Patient says he is feeling better but not 100%. He says he has been able to stand up and move. He's been eating a little bit more. He does report that he is still feeling confused today and although he is not having hallucinations he feels at times like his mind is getting overwhelmed. Still feels a little bit down but denies suicidal thoughts. Vital signs appear to be stable. Wife expressed serious concerns about the patient being discharged to his previous level of independence. She says his drinking has been out of control for a long time and she is worried about him immediately returning to drinking. When I spoke to the Asian this afternoon and asked him if he intended to quit drinking he told me that he did not but intended only to cut down a little bit. I strongly encouraged him to rethink this and engage in appropriate treatment.  HPI:  Patient interviewed. Chart reviewed. Labs and vitals reviewed.  65 year old man brought to the emergency room at the urging of his daughter because of recent falls at home and what appears to be a rapid decline in his self-care. Patient admits that he has fallen multiple times over the last few weeks. He says his mood has been depressed. He admits that he is not eating well. He has lost probably 40 pounds over the last few weeks to months. Daughter reports that his self-care has become much worse. Patient says he has nothing to do and feels lonely much of the time. Little activity during the day. Poor sleep court. Poor appetite. Admits that he's been drinking regularly. Says he hadn't had any alcohol in about a week before coming into the hospital but before that was drinking about 10 beers a day. Not getting any outpatient psychiatric treatment.  Social history: Patient lives by himself. Has at least 1 adult daughter who is making some effort to look out for him. Otherwise little social activity. He says he does keep some social contact with the people in his apartment complex.  Medical history: History of recurrent hyponatremia which is also part of his admission now. History of malnutrition. History of hypertension and gastric reflux discomfort.  Substance abuse history: Long history of alcohol abuse. Identified admissions for alcohol abuse probably about 8 years ago. He says he hasn't had a drink in about a week. He thinks he has never had a seizure from drinking in the past. Denies that he is abusing any other drugs. Says he gave that up years ago. In the past had done some outpatient substance abuse treatment  but has not been keeping up with additional long time.  Past Psychiatric History: Patient denies that he's ever try to kill himself. He has had psychiatric hospitalization at our facility about 7 years ago although he claims not to remember much about it and the full note is not available. He was diagnosed with both depression and alcohol problems at that  time. Has not been compliant with any recent outpatient psychiatric treatment. Denies any history of psychotic symptoms.  Risk to Self: Is patient at risk for suicide?: No Risk to Others:   Prior Inpatient Therapy:   Prior Outpatient Therapy:    Past Medical History:  Past Medical History  Diagnosis Date  . GERD (gastroesophageal reflux disease)   . Alcohol abuse     Past Surgical History  Procedure Laterality Date  . Cholecystectomy     Family History:  Family History  Problem Relation Age of Onset  . Alcohol abuse Mother   . Alcohol abuse Father    Family Psychiatric  History: Positive for alcohol abuse denies any other mental health problems in his family Social History:  History  Alcohol Use  . 36.0 oz/week  . 60 Cans of beer per week     History  Drug Use No    Social History   Social History  . Marital Status: Legally Separated    Spouse Name: N/A  . Number of Children: N/A  . Years of Education: N/A   Social History Main Topics  . Smoking status: Current Every Day Smoker -- 0.50 packs/day  . Smokeless tobacco: None  . Alcohol Use: 36.0 oz/week    60 Cans of beer per week  . Drug Use: No  . Sexual Activity: Not Asked   Other Topics Concern  . None   Social History Narrative   Additional Social History: Specify valuables returned: Clothing  Allergies:  No Known Allergies  Labs:  Results for orders placed or performed during the hospital encounter of 03/03/16 (from the past 48 hour(s))  Phosphorus     Status: None   Collection Time: 03/06/16  5:31 AM  Result Value Ref Range   Phosphorus 3.6 2.5 - 4.6 mg/dL  Magnesium     Status: Abnormal   Collection Time: 03/06/16  5:31 AM  Result Value Ref Range   Magnesium 1.6 (L) 1.7 - 2.4 mg/dL  Basic metabolic panel     Status: Abnormal   Collection Time: 03/06/16  5:31 AM  Result Value Ref Range   Sodium 133 (L) 135 - 145 mmol/L   Potassium 3.6 3.5 - 5.1 mmol/L   Chloride 107 101 - 111 mmol/L   CO2  21 (L) 22 - 32 mmol/L   Glucose, Bld 94 65 - 99 mg/dL   BUN 10 6 - 20 mg/dL   Creatinine, Ser 0.55 (L) 0.61 - 1.24 mg/dL   Calcium 7.7 (L) 8.9 - 10.3 mg/dL   GFR calc non Af Amer >60 >60 mL/min   GFR calc Af Amer >60 >60 mL/min    Comment: (NOTE) The eGFR has been calculated using the CKD EPI equation. This calculation has not been validated in all clinical situations. eGFR's persistently <60 mL/min signify possible Chronic Kidney Disease.    Anion gap 5 5 - 15  Basic metabolic panel     Status: Abnormal   Collection Time: 03/07/16  4:16 AM  Result Value Ref Range   Sodium 135 135 - 145 mmol/L   Potassium 3.8 3.5 - 5.1 mmol/L  Chloride 109 101 - 111 mmol/L   CO2 22 22 - 32 mmol/L   Glucose, Bld 116 (H) 65 - 99 mg/dL   BUN 9 6 - 20 mg/dL   Creatinine, Ser 0.76 0.61 - 1.24 mg/dL   Calcium 7.9 (L) 8.9 - 10.3 mg/dL   GFR calc non Af Amer >60 >60 mL/min   GFR calc Af Amer >60 >60 mL/min    Comment: (NOTE) The eGFR has been calculated using the CKD EPI equation. This calculation has not been validated in all clinical situations. eGFR's persistently <60 mL/min signify possible Chronic Kidney Disease.    Anion gap 4 (L) 5 - 15  Magnesium     Status: Abnormal   Collection Time: 03/07/16  4:16 AM  Result Value Ref Range   Magnesium 1.5 (L) 1.7 - 2.4 mg/dL  Phosphorus     Status: None   Collection Time: 03/07/16  4:16 AM  Result Value Ref Range   Phosphorus 3.2 2.5 - 4.6 mg/dL    Current Facility-Administered Medications  Medication Dose Route Frequency Provider Last Rate Last Dose  . acetaminophen (TYLENOL) tablet 650 mg  650 mg Oral Q6H PRN Debby Crosley, MD   650 mg at 03/05/16 2009   Or  . acetaminophen (TYLENOL) suppository 650 mg  650 mg Rectal Q6H PRN Debby Crosley, MD      . amLODipine (NORVASC) tablet 5 mg  5 mg Oral Daily Debby Crosley, MD   5 mg at 03/07/16 1023  . antiseptic oral rinse (CPC / CETYLPYRIDINIUM CHLORIDE 0.05%) solution 7 mL  7 mL Mouth Rinse BID Debby  Crosley, MD   7 mL at 03/07/16 1027  . enoxaparin (LOVENOX) injection 40 mg  40 mg Subcutaneous Q24H Gladstone Lighter, MD   40 mg at 03/06/16 2023  . feeding supplement (ENSURE ENLIVE) (ENSURE ENLIVE) liquid 237 mL  237 mL Oral BID BM Debby Crosley, MD   237 mL at 03/07/16 1524  . folic acid (FOLVITE) tablet 1 mg  1 mg Oral Daily Hinda Kehr, MD   1 mg at 03/07/16 1023  . LORazepam (ATIVAN) tablet 0-4 mg  0-4 mg Oral Q12H Hinda Kehr, MD   0 mg at 03/05/16 2200  . metoprolol tartrate (LOPRESSOR) tablet 25 mg  25 mg Oral BID Debby Crosley, MD   25 mg at 03/07/16 1023  . mirtazapine (REMERON) tablet 30 mg  30 mg Oral QHS Gonzella Lex, MD      . multivitamin with minerals tablet 1 tablet  1 tablet Oral Daily Hinda Kehr, MD   1 tablet at 03/07/16 1023  . nicotine (NICODERM CQ - dosed in mg/24 hours) patch 21 mg  21 mg Transdermal Daily PRN Hinda Kehr, MD   21 mg at 03/04/16 2015  . ondansetron (ZOFRAN) tablet 4 mg  4 mg Oral Q6H PRN Debby Crosley, MD       Or  . ondansetron (ZOFRAN) injection 4 mg  4 mg Intravenous Q6H PRN Debby Crosley, MD      . pantoprazole (PROTONIX) EC tablet 40 mg  40 mg Oral QAC breakfast Debby Crosley, MD   40 mg at 03/07/16 1023  . senna-docusate (Senokot-S) tablet 1 tablet  1 tablet Oral QHS PRN Debby Crosley, MD      . tamsulosin (FLOMAX) capsule 0.4 mg  0.4 mg Oral Daily Debby Crosley, MD   0.4 mg at 03/07/16 1023  . thiamine (VITAMIN B-1) tablet 100 mg  100 mg Oral Daily Hinda Kehr, MD  100 mg at 03/07/16 1023   Or  . thiamine (B-1) injection 100 mg  100 mg Intravenous Daily Hinda Kehr, MD        Musculoskeletal: Strength & Muscle Tone: decreased Gait & Station: unable to stand Patient leans: N/A  Psychiatric Specialty Exam: Physical Exam  Nursing note and vitals reviewed. Constitutional: He appears well-nourished. He appears lethargic. He appears cachectic.  HENT:  Head: Normocephalic and atraumatic.  Eyes: Conjunctivae are normal. Pupils are  equal, round, and reactive to light.  Neck: Normal range of motion.  Cardiovascular: Normal heart sounds.   Respiratory: Effort normal.  GI: Soft.  Musculoskeletal: Normal range of motion.  Neurological: He appears lethargic.  Skin: Skin is warm and dry.  Psychiatric: His affect is blunt. His speech is delayed. He is slowed. Cognition and memory are impaired. He expresses impulsivity. He does not exhibit a depressed mood. He expresses no suicidal ideation. He exhibits abnormal recent memory.    Review of Systems  Constitutional: Positive for weight loss and malaise/fatigue.  HENT: Negative.   Eyes: Negative.   Respiratory: Negative.   Cardiovascular: Negative.   Gastrointestinal: Negative.   Musculoskeletal: Positive for falls.  Skin: Negative.   Neurological: Positive for weakness.  Psychiatric/Behavioral: Positive for memory loss and substance abuse. Negative for depression, suicidal ideas and hallucinations. The patient is nervous/anxious and has insomnia.     Blood pressure 92/56, pulse 81, temperature 97.7 F (36.5 C), temperature source Oral, resp. rate 20, height 6' (1.829 m), weight 57.788 kg (127 lb 6.4 oz), SpO2 98 %.Body mass index is 17.27 kg/(m^2).  General Appearance: Disheveled  Eye Contact:  Minimal  Speech:  Garbled and Slow  Volume:  Decreased  Mood:  Depressed  Affect:  Depressed  Thought Process:  Descriptions of Associations: Tangential  Orientation:  Full (Time, Place, and Person)  Thought Content:  Tangential  Suicidal Thoughts:  No  Homicidal Thoughts:  No  Memory:  Immediate;   Fair Recent;   Fair Remote;   Fair  Judgement:  Impaired  Insight:  Shallow  Psychomotor Activity:  Decreased  Concentration:  Concentration: Fair  Recall:  [employer] of Knowledge:  Fair  Language:  Fair  Akathisia:  No  Handed:  Right  AIMS (if indicated):     Assets:  Housing Social Support  ADL's:  Impaired  Cognition:  Impaired,  Mild  Sleep:         Treatment Plan Summary: Medication management and Plan 65 year old man with alcohol abuse, weight loss, depression. Does not meet commitment criteria. He is wanting to be discharged home. I reviewed his medication. Patient should not be on Librium 10 mg 3 times a day at discharge. I don't think he needs this medicine. If he is going to have worse withdrawal that is unlikely to keep him out of bed and in any case it sounds like he intends to go drink. I did increase his mirtazapine to 30 mg at night for actually effective treatment of depression. Advised the patient and the strongest terms that he needs to absolutely stop drinking. Expressed my understanding and support of his wife in her frustration about the patient not engaging in appropriate substance abuse treatment. He is referred to go to [facility] for appropriate outpatient treatment.  Disposition: Supportive therapy provided about ongoing stressors.  Alethia Berthold, MD 03/07/2016 6:36 PM

## 2016-03-07 NOTE — Clinical Social Work Note (Signed)
CSW consulted for New SNF. CSW reviewed chart. PT is recommending HHPT. CSW discussed case with Clinical Catering managerocial Worker Department Assistance Director. Pt is not appropriate for SNF placement. CSW updated RNCM, who will follow up with pt for discharge planning needs. CSW updated MD. CSW, however, does recommend a home health social work. Substance abuse resources provided. CSW is signing off as no further needs identified.   Dede QuerySarah Azelyn Batie, MSW, LCSW  Clinical Social Worker [phone]

## 2016-03-07 NOTE — Care Management Important Message (Signed)
Important Message  Patient Details  Name: Luis [name] MRN: [id number] Date of Birth: 11/22/1950   Medicare Important Message Given:  Yes    Gwenette GreetBrenda S Torrian Canion, RN 03/07/2016, 9:43 AM

## 2016-03-07 NOTE — Progress Notes (Signed)
[facility] [place] at [facility]   PATIENT NAME: Luis SatoMichael [name]    MR#:  [id number]  DATE OF BIRTH:  03/07/1951  SUBJECTIVE:  No acute events overnight. Doing well ready for discharge  REVIEW OF SYSTEMS:    Review of Systems  Constitutional: Positive for weight loss. Negative for fever, chills and malaise/fatigue.  HENT: Negative for ear discharge, ear pain, hearing loss, nosebleeds and sore throat.   Eyes: Negative for blurred vision and pain.  Respiratory: Negative for cough, hemoptysis, shortness of breath and wheezing.   Cardiovascular: Negative for chest pain, palpitations and leg swelling.  Gastrointestinal: Negative for nausea, vomiting, abdominal pain, diarrhea and blood in stool.  Genitourinary: Negative for dysuria.  Musculoskeletal: Negative for back pain.  Neurological: Positive for weakness. Negative for dizziness, tremors, speech change, focal weakness, seizures and headaches.  Endo/Heme/Allergies: Does not bruise/bleed easily.  Psychiatric/Behavioral: Positive for depression and substance abuse. Negative for suicidal ideas and hallucinations.    Tolerating Diet:yes      DRUG ALLERGIES:  No Known Allergies  VITALS:  Blood pressure 115/71, pulse 80, temperature 97.7 F (36.5 C), temperature source Oral, resp. rate 18, height 6' (1.829 m), weight 57.788 kg (127 lb 6.4 oz), SpO2 100 %.  PHYSICAL EXAMINATION:   Physical Exam  Constitutional: He is oriented to person, place, and time and well-developed, well-nourished, and in no distress. No distress.  HENT:  Head: Normocephalic.  Eyes: No scleral icterus.  Neck: Normal range of motion. Neck supple. No JVD present. No tracheal deviation present.  Cardiovascular: Normal rate, regular rhythm and normal heart sounds.  Exam reveals no gallop and no friction rub.   No murmur heard. Pulmonary/Chest: Effort normal and breath sounds normal. No respiratory distress. He has no wheezes. He has no rales.  He exhibits no tenderness.  Abdominal: Soft. Bowel sounds are normal. He exhibits no distension and no mass. There is no tenderness. There is no rebound and no guarding.  Musculoskeletal: Normal range of motion. He exhibits no edema.  Neurological: He is alert and oriented to person, place, and time.  Skin: Skin is warm. No rash noted. No erythema.  Psychiatric: Affect and judgment normal.      LABORATORY PANEL:   CBC  Recent Labs Lab 03/04/16 0417  WBC 9.2  HGB 12.1*  HCT 34.5*  PLT 122*   ------------------------------------------------------------------------------------------------------------------  Chemistries   Recent Labs Lab 03/03/16 1827  03/07/16 0416  NA 123*  < > 135  K 3.6  < > 3.8  CL 90*  < > 109  CO2 23  < > 22  GLUCOSE 114*  < > 116*  BUN 13  < > 9  CREATININE 0.90  < > 0.76  CALCIUM 8.5*  < > 7.9*  MG  --   < > 1.5*  AST 83*  --   --   ALT 60  --   --   ALKPHOS 161*  --   --   BILITOT 0.6  --   --   < > = values in this interval not displayed. ------------------------------------------------------------------------------------------------------------------  Cardiac Enzymes No results for input(s): TROPONINI in the last 168 hours. ------------------------------------------------------------------------------------------------------------------  RADIOLOGY:  No results found.   ASSESSMENT AND PLAN:   65 year old male with a history of EtOH, tobacco abuse and essential hypertension who presented with falls and weight loss.  1. Hyponatremia: This was due to hypovolemia and poor by mouth intake. Patient's main source of calories was EtOH. Sodium level improved with  IVF 2. Hypokalemia: Improved with replacement.  3. Acute metabolic encephalopathy due to hyponatremia. This has improved.  4. EtOH use: he was on librium taper and CIWA protocol. He is encouraged to stop drinking EtOh.  5. Depression and anxiety: Appreciate psychiatry  consultation. He was started on Remeron.  6. Essential hypertension: Continue metoprolol and Norvasc.  7. Weight loss with severe protein calorie malnutrition: Continue Ensure. This is due to poor diet.  8. Tobacco dependence: Continue nicotine patch.  9. Hypo-magnesium: Repleted PRN  10. Generalized weakness: Physical therapy Is recommending home with home health care. However family concerned will d/w CM  Management plans discussed with the patient and he is in agreement.  CODE STATUS: FULL  TOTAL TIME TAKING CARE OF THIS PATIENT: 24 minutes.     POSSIBLE D/C today DEPENDING ON CLINICAL CONDITION.   Jaan Fischel M.D on 03/06/16 at 8:53 AM  Between 7am to 6pm - Pager - [phone] After 6pm go to [email or web address] - Social research officer, government  [facility]  Office  [phone]  CC: Primary care physician; No PCP Per Patient  Note: This dictation was prepared with Dragon dictation along with smaller phrase technology. Any transcriptional errors that result from this process are unintentional.

## 2016-03-08 NOTE — Progress Notes (Signed)
MD making rounds. Discharge orders received. IV removed. Prescriptions given to patient. Discharge paperwork provided, explained, signed and witnessed. No unanswered questions. Discharged via wheelchair by Nursing Staff. Walker provided by [facility] sent with patient. CNA assisted patient to Taxi provided with [employer] [facility]. RN placed call to Alycia RossettiAnna Greene, facilitating for [facility] to update discharge of patient.

## 2016-03-08 NOTE — Care Management (Addendum)
Spoke with Child psychotherapistocial Worker, Ms Neva SeatGreene, at [facility]. Will arrange transportation for 9:00am this morning. Will be followed by [facility] for nursing services and was issued a rolling walker. Will be discharged today per Dr. Juliene PinaMody. Spoke with Child psychotherapistocial Worker, Alycia RossettiAnna Greene [phone]). Unable to provide transportation this morning. Will issue taxi voucher. Gwenette GreetBrenda S Anesia Blackwell RN MSN CCM Care Management [phone]

## 2016-03-08 NOTE — Progress Notes (Signed)
Pt refused morning lab draws at this time.

## 2016-03-10 ENCOUNTER — Encounter: Payer: Self-pay | Admitting: Emergency Medicine

## 2016-03-10 ENCOUNTER — Observation Stay
Admission: EM | Admit: 2016-03-10 | Discharge: 2016-03-14 | Disposition: A | Discharge status: Home / Self Care | Payer: Medicare HMO | Attending: Internal Medicine | Admitting: Internal Medicine

## 2016-03-10 DIAGNOSIS — K2980 Duodenitis without bleeding: Secondary | ICD-10-CM | POA: Insufficient documentation

## 2016-03-10 DIAGNOSIS — F17200 Nicotine dependence, unspecified, uncomplicated: Secondary | ICD-10-CM | POA: Insufficient documentation

## 2016-03-10 DIAGNOSIS — E871 Hypo-osmolality and hyponatremia: Secondary | ICD-10-CM | POA: Diagnosis not present

## 2016-03-10 DIAGNOSIS — Z811 Family history of alcohol abuse and dependence: Secondary | ICD-10-CM | POA: Diagnosis not present

## 2016-03-10 DIAGNOSIS — K922 Gastrointestinal hemorrhage, unspecified: Secondary | ICD-10-CM | POA: Diagnosis present

## 2016-03-10 DIAGNOSIS — F1020 Alcohol dependence, uncomplicated: Secondary | ICD-10-CM | POA: Insufficient documentation

## 2016-03-10 DIAGNOSIS — Z79899 Other long term (current) drug therapy: Secondary | ICD-10-CM | POA: Diagnosis not present

## 2016-03-10 DIAGNOSIS — L89152 Pressure ulcer of sacral region, stage 2: Secondary | ICD-10-CM | POA: Insufficient documentation

## 2016-03-10 DIAGNOSIS — D649 Anemia, unspecified: Secondary | ICD-10-CM | POA: Diagnosis not present

## 2016-03-10 DIAGNOSIS — E43 Unspecified severe protein-calorie malnutrition: Secondary | ICD-10-CM | POA: Diagnosis not present

## 2016-03-10 DIAGNOSIS — Z9049 Acquired absence of other specified parts of digestive tract: Secondary | ICD-10-CM | POA: Insufficient documentation

## 2016-03-10 DIAGNOSIS — R627 Adult failure to thrive: Secondary | ICD-10-CM | POA: Diagnosis not present

## 2016-03-10 DIAGNOSIS — K259 Gastric ulcer, unspecified as acute or chronic, without hemorrhage or perforation: Secondary | ICD-10-CM | POA: Insufficient documentation

## 2016-03-10 DIAGNOSIS — R6251 Failure to thrive (child): Secondary | ICD-10-CM | POA: Diagnosis present

## 2016-03-10 DIAGNOSIS — F332 Major depressive disorder, recurrent severe without psychotic features: Secondary | ICD-10-CM | POA: Insufficient documentation

## 2016-03-10 DIAGNOSIS — K219 Gastro-esophageal reflux disease without esophagitis: Secondary | ICD-10-CM | POA: Diagnosis not present

## 2016-03-10 DIAGNOSIS — L8990 Pressure ulcer of unspecified site, unspecified stage: Secondary | ICD-10-CM | POA: Insufficient documentation

## 2016-03-10 LAB — COMPREHENSIVE METABOLIC PANEL
ALK PHOS: 123 U/L (ref 38–126)
ALT: 38 U/L (ref 17–63)
ANION GAP: 8 (ref 5–15)
AST: 45 U/L — ABNORMAL HIGH (ref 15–41)
Albumin: 2.2 g/dL — ABNORMAL LOW (ref 3.5–5.0)
BILIRUBIN TOTAL: 0.4 mg/dL (ref 0.3–1.2)
BUN: 11 mg/dL (ref 6–20)
CALCIUM: 8 mg/dL — AB (ref 8.9–10.3)
CO2: 24 mmol/L (ref 22–32)
Chloride: 102 mmol/L (ref 101–111)
Creatinine, Ser: 0.84 mg/dL (ref 0.61–1.24)
Glucose, Bld: 84 mg/dL (ref 65–99)
Potassium: 3.8 mmol/L (ref 3.5–5.1)
Sodium: 134 mmol/L — ABNORMAL LOW (ref 135–145)
TOTAL PROTEIN: 5.1 g/dL — AB (ref 6.5–8.1)

## 2016-03-10 LAB — CBC WITH DIFFERENTIAL/PLATELET
BASOS ABS: 0.1 10*3/uL (ref 0–0.1)
Basophils Relative: 1 %
Eosinophils Absolute: 0.2 10*3/uL (ref 0–0.7)
Eosinophils Relative: 3 %
HEMATOCRIT: 28.3 % — AB (ref 40.0–52.0)
Hemoglobin: 9.7 g/dL — ABNORMAL LOW (ref 13.0–18.0)
LYMPHS PCT: 16 %
Lymphs Abs: 1.2 10*3/uL (ref 1.0–3.6)
MCH: 31.7 pg (ref 26.0–34.0)
MCHC: 34.1 g/dL (ref 32.0–36.0)
MCV: 92.9 fL (ref 80.0–100.0)
MONO ABS: 0.9 10*3/uL (ref 0.2–1.0)
Monocytes Relative: 12 %
NEUTROS ABS: 5.2 10*3/uL (ref 1.4–6.5)
Neutrophils Relative %: 68 %
Platelets: 289 10*3/uL (ref 150–440)
RBC: 3.05 MIL/uL — AB (ref 4.40–5.90)
RDW: 16 % — ABNORMAL HIGH (ref 11.5–14.5)
WBC: 7.6 10*3/uL (ref 3.8–10.6)

## 2016-03-10 LAB — IRON: Iron: 12 ug/dL — ABNORMAL LOW (ref 45–182)

## 2016-03-10 LAB — VITAMIN B12: Vitamin B-12: 584 pg/mL (ref 180–914)

## 2016-03-10 LAB — FERRITIN: Ferritin: 239 ng/mL (ref 24–336)

## 2016-03-10 MED ORDER — ACETAMINOPHEN 650 MG RE SUPP: 650.0000 mg | Freq: Four times a day (QID) | RECTAL | Status: DC | PRN

## 2016-03-10 MED ORDER — FAMOTIDINE IN NACL 20-0.9 MG/50ML-% IV SOLN
20.0000 mg | Freq: Two times a day (BID) | INTRAVENOUS | Status: DC
Start: 2016-03-10 — End: 2016-03-11
  Administered 2016-03-10 (×2): 20 mg via INTRAVENOUS
  Filled 2016-03-10 (×4): qty 50

## 2016-03-10 MED ORDER — ONDANSETRON HCL 4 MG/2ML IJ SOLN: 4.0000 mg | Freq: Four times a day (QID) | INTRAMUSCULAR | Status: DC | PRN

## 2016-03-10 MED ORDER — TAMSULOSIN HCL 0.4 MG PO CAPS
0.4000 mg | ORAL_CAPSULE | Freq: Every day | ORAL | Status: DC
  Administered 2016-03-10 – 2016-03-14 (×4): 0.4 mg via ORAL
  Filled 2016-03-10 (×4): qty 1

## 2016-03-10 MED ORDER — PANTOPRAZOLE SODIUM 40 MG PO TBEC
40.0000 mg | DELAYED_RELEASE_TABLET | Freq: Every day | ORAL | Status: DC
Start: 2016-03-10 — End: 2016-03-11
  Administered 2016-03-10: 40 mg via ORAL
  Filled 2016-03-10: qty 1

## 2016-03-10 MED ORDER — MIRTAZAPINE 15 MG PO TABS
30.0000 mg | ORAL_TABLET | Freq: Every day | ORAL | Status: DC
  Administered 2016-03-10 – 2016-03-13 (×4): 30 mg via ORAL
  Filled 2016-03-10 (×4): qty 2

## 2016-03-10 MED ORDER — ENSURE ENLIVE PO LIQD
237.0000 mL | Freq: Two times a day (BID) | ORAL | Status: DC
  Administered 2016-03-12 – 2016-03-14 (×5): 237 mL via ORAL

## 2016-03-10 MED ORDER — BISACODYL 5 MG PO TBEC
5.0000 mg | DELAYED_RELEASE_TABLET | Freq: Every day | ORAL | Status: DC | PRN
  Filled 2016-03-10: qty 1

## 2016-03-10 MED ORDER — ONDANSETRON HCL 4 MG PO TABS: 4.0000 mg | ORAL_TABLET | Freq: Four times a day (QID) | ORAL | Status: DC | PRN

## 2016-03-10 MED ORDER — ACETAMINOPHEN 325 MG PO TABS
650.0000 mg | ORAL_TABLET | Freq: Four times a day (QID) | ORAL | Status: DC | PRN
  Administered 2016-03-10 – 2016-03-13 (×4): 650 mg via ORAL
  Filled 2016-03-10 (×4): qty 2

## 2016-03-10 MED ORDER — METOPROLOL TARTRATE 25 MG PO TABS
25.0000 mg | ORAL_TABLET | Freq: Two times a day (BID) | ORAL | Status: DC
  Administered 2016-03-10 – 2016-03-14 (×7): 25 mg via ORAL
  Filled 2016-03-10 (×7): qty 1

## 2016-03-10 NOTE — H&P (Signed)
[facility] [place] at [facility]   PATIENT NAME: Luis [name]    MR#:  [id number]  DATE OF BIRTH:  [birth year]  DATE OF ADMISSION:  03/10/2016  PRIMARY CARE PHYSICIAN: No PCP Per Patient   REQUESTING/REFERRING PHYSICIAN: Dr. Derrill Kay  CHIEF COMPLAINT: Failure to thrive    Chief Complaint  Patient presents with  . Failure To Thrive    HISTORY OF PRESENT ILLNESS:  Luis [name]  is a 65 y.o. male with a known history of Hyponatremia chronic alcohol abuse, malnutrition who was recently discharged 2 days ago comes back because social worker found him, covered with , feces and patient was lying in the same spot since 2 days. Agent was discharged on June 13 with home health physical therapy. That time patient was  admitted for hyponatremia which was improved .patient hemoglobin  9.7 dropped from 12.1 a week ago.guaic  Positive stools. Patient unable to walk, /concerning  Anemia with guaic  Positive stool, and also difficulty with ambulation patient was referred for admission. His  last drink was 10 days ago,  PAST MEDICAL HISTORY:   Past Medical History  Diagnosis Date  . GERD (gastroesophageal reflux disease)   . Alcohol abuse     PAST SURGICAL HISTOIRY:   Past Surgical History  Procedure Laterality Date  . Cholecystectomy      SOCIAL HISTORY:   Social History  Substance Use Topics  . Smoking status: Current Every Day Smoker -- 0.50 packs/day  . Smokeless tobacco: Not on file  . Alcohol Use: 36.0 oz/week    60 Cans of beer per week    FAMILY HISTORY:   Family History  Problem Relation Age of Onset  . Alcohol abuse Mother   . Alcohol abuse Father     DRUG ALLERGIES:  No Known Allergies  REVIEW OF SYSTEMS:  CONSTITUTIONAL: No fever, fatigue or weakness.  EYES: No blurred or double vision.  EARS, NOSE, AND THROAT: No tinnitus or ear pain.  RESPIRATORY: No cough, shortness of breath, wheezing or hemoptysis.  CARDIOVASCULAR: No chest  pain, orthopnea, edema.  GASTROINTESTINAL: No nausea, vomiting, diarrhea or abdominal pain.  GENITOURINARY: No dysuria, hematuria.  ENDOCRINE: No polyuria, nocturia,  HEMATOLOGY:Anemia, guaiac-positive stools.  SKIN: No rash or lesion. MUSCULOSKELETAL: No joint pain or arthritis.   NEUROLOGIC: No tingling, numbness, weakness.  PSYCHIATRY: No anxiety or depression.   MEDICATIONS AT HOME:   Prior to Admission medications   Medication Sig Start Date End Date Taking? Authorizing Provider  esomeprazole (NEXIUM) 20 MG capsule Take 20 mg by mouth daily at 12 noon. Reported on 03/03/2016   Yes Historical Provider, MD  metoprolol tartrate (LOPRESSOR) 25 MG tablet Take 1 tablet (25 mg total) by mouth 2 (two) times daily. 12/09/15  Yes Enid Baas, MD  mirtazapine (REMERON) 30 MG tablet Take 1 tablet (30 mg total) by mouth at bedtime. 03/07/16  Yes Sital Mody, MD  nicotine (NICODERM CQ - DOSED IN MG/24 HOURS) 21 mg/24hr patch Place 1 patch (21 mg total) onto the skin daily as needed (if patient is current tobacco user and requests patch). 03/07/16  Yes Adrian Saran, MD  tamsulosin (FLOMAX) 0.4 MG CAPS capsule Take 1 capsule (0.4 mg total) by mouth daily. 12/09/15  Yes Enid Baas, MD  feeding supplement, ENSURE ENLIVE, (ENSURE ENLIVE) LIQD Take 237 mLs by mouth 2 (two) times daily between meals. 03/07/16   Adrian Saran, MD      VITAL SIGNS:  Blood pressure 141/92, pulse 91, temperature  97.8 F (36.6 C), temperature source Oral, resp. rate 18, height 6' (1.829 m), weight 63.504 kg (140 lb), SpO2 98 %.  PHYSICAL EXAMINATION:  GENERAL:  65 y.o.-year-old patient lying in the bed with no acute distress.  EYES: Pupils equal, round, reactive to light and accommodation. No scleral icterus. Extraocular muscles intact.  HEENT: Head atraumatic, normocephalic. Oropharynx and nasopharynx clear.  NECK:  Supple, no jugular venous distention. No thyroid enlargement, no tenderness.  LUNGS: Normal breath sounds  bilaterally, no wheezing, rales,rhonchi or crepitation. No use of accessory muscles of respiration.  CARDIOVASCULAR: S1, S2 normal. No murmurs, rubs, or gallops.  ABDOMEN: Soft, nontender, nondistended. Bowel sounds present. No organomegaly or mass.  EXTREMITIES: No pedal edema, cyanosis, or clubbing.  NEUROLOGIC: Cranial nerves II through XII are intact. Muscle strength 5/5 in all extremities. Sensation intact. Gait not checked.  PSYCHIATRIC: The patient is alert and oriented x 3 And had a history of falls.  SKIN: No obvious rash, lesion, or ulcer.   LABORATORY PANEL:   CBC  Recent Labs Lab 03/10/16 1317  WBC 7.6  HGB 9.7*  HCT 28.3*  PLT 289   ------------------------------------------------------------------------------------------------------------------  Chemistries   Recent Labs Lab 03/07/16 0416 03/10/16 1317  NA 135 134*  K 3.8 3.8  CL 109 102  CO2 22 24  GLUCOSE 116* 84  BUN 9 11  CREATININE 0.76 0.84  CALCIUM 7.9* 8.0*  MG 1.5*  --   AST  --  45*  ALT  --  38  ALKPHOS  --  123  BILITOT  --  0.4   ------------------------------------------------------------------------------------------------------------------  Cardiac Enzymes No results for input(s): TROPONINI in the last 168 hours. ------------------------------------------------------------------------------------------------------------------  RADIOLOGY:  No results found.  EKG:   Orders placed or performed during the hospital encounter of 03/03/16  . ED EKG  . ED EKG  . EKG 12-Lead  . EKG 12-Lead  . EKG    IMPRESSION AND PLAN:   #1 acute on chronic anemia with guaiac-positive stools: Patient had been dropped from 12.1-9.7 in 1 week consult gastroenterology, continue PPIs.no Indication for blood transfusion at this time.  #2 .with generalized weakness and ambulatory difficulties likely multifactorial due to mal nutrition, failure to thrive: Physical therapy recommended home health last  time, we will reconsult physical therapy. #3 .failure to thrive due to malnutrition: Patient is already on ensure #4 severe recurrent major depression: Denies any suicidal or homicidal ideation at this time. Seen by psychiatric during last admission. Patient is on mirtazapine. Continue that. Consult psychiatrically if needed. At this time I don't think he needs any psychiatric evaluation..    All the records are reviewed and case discussed with ED provider. Management plans discussed with the patient, family and they are in agreement.  CODE STATUS:full  TOTAL TIME TAKING CARE OF THIS PATIENT: 55 minutes.    Katha HammingKONIDENA,Lathon Adan M.D on 03/10/2016 at 4:43 PM  Between 7am to 6pm - Pager - [phone]  After 6pm go to [email or web address] - password [email or web address] [facility]  [facility]  Office  [phone]  CC: Primary care physician; No PCP Per Patient  Note: This dictation was prepared with Dragon dictation along with smaller phrase technology. Any transcriptional errors that result from this process are unintentional.

## 2016-03-10 NOTE — ED Notes (Signed)
Pt comes into the ED via EMS from home c/o failure to thrive.  Patient was released from our facility on Sunday for detox.  Patient normally consumes 10-12 beers a day but has not had any in 10 days.  Patient had a Child psychotherapistsocial worker go to his home today to check on him and she found him unable to get and take care of himself.  It was stated that the patient was sitting in his own feces and urine.  Patient states "I cant walk on my own".  Per EMS, patient was able to ambulate with assistance to the EMS truck.  129/80, CBG 105.

## 2016-03-10 NOTE — ED Notes (Signed)
Patient arrived in a soiled paper scrub top. Patient was changed into a new scrub top. A walker was brought into the room to assess the patient's strength, but patient refused to use it.

## 2016-03-10 NOTE — ED Provider Notes (Signed)
[facility] Emergency Department Provider Note   ____________________________________________  Time seen: ~1305  I have reviewed the triage vital signs and the nursing notes.   HISTORY  Chief Complaint Failure To Thrive   History limited by: Not Limited   HPI Luis [name] is a 65 y.o. male who presents to the emergency department today via EMS because of social work's concern for failure to thrive.The patient was discharged from the hospital 2 days ago after detox from alcohol. Since that time he has stayed in one spot in his house. Per EMS he was sitting in fecal matter and urine. The patient states that he is unable to walk. He says he has not been able to walk for months. He denies being seen by physical therapy here in the hospital however there are notes in the chart from physical therapy documenting that the patient was able to walk. Patient denies any fevers from discharge. Denies any alcohol use since discharge.   Past Medical History  Diagnosis Date  . GERD (gastroesophageal reflux disease)   . Alcohol abuse     Patient Active Problem List   Diagnosis Date Noted  . Protein-calorie malnutrition, severe 03/04/2016  . 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 Alcohol abuse 12/07/2015  . Delirium tremens (HCC) 12/07/2015  . Malnutrition of moderate degree 12/04/2015  . Hyponatremia 06/18/2015    Past Surgical History  Procedure Laterality Date  . Cholecystectomy      Current Outpatient Rx  Name  Route  Sig  Dispense  Refill  . esomeprazole (NEXIUM) 20 MG capsule   Oral   Take 20 mg by mouth daily at 12 noon. Reported on 03/03/2016         . feeding supplement, ENSURE ENLIVE, (ENSURE ENLIVE) LIQD   Oral   Take 237 mLs by mouth 2 (two) times daily between meals.   237 mL   0   . metoprolol tartrate (LOPRESSOR) 25 MG tablet   Oral   Take 1 tablet (25 mg total) by mouth 2 (two) times  daily. Patient not taking: Reported on 03/03/2016   60 tablet   2   . mirtazapine (REMERON) 30 MG tablet   Oral   Take 1 tablet (30 mg total) by mouth at bedtime.   30 tablet   0   . nicotine (NICODERM CQ - DOSED IN MG/24 HOURS) 21 mg/24hr patch   Transdermal   Place 1 patch (21 mg total) onto the skin daily as needed (if patient is current tobacco user and requests patch).   28 patch   0   . tamsulosin (FLOMAX) 0.4 MG CAPS capsule   Oral   Take 1 capsule (0.4 mg total) by mouth daily.   30 capsule   2     Allergies Review of patient's allergies indicates no known allergies.  Family History  Problem Relation Age of Onset  . Alcohol abuse Mother   . Alcohol abuse Father     Social History Social History  Substance Use Topics  . Smoking status: Current Every Day Smoker -- 0.50 packs/day  . Smokeless tobacco: None  . Alcohol Use: 36.0 oz/week    60 Cans of beer per week    Review of Systems  Constitutional: Negative for fever. Cardiovascular: Negative for chest pain. Respiratory: Negative for shortness of breath. Gastrointestinal: Negative for abdominal pain, vomiting and diarrhea. Neurological: Negative for headaches, focal weakness or numbness.   10-point ROS otherwise negative.  ____________________________________________   PHYSICAL EXAM:  VITAL SIGNS: ED Triage Vitals  Enc Vitals Group     BP 03/10/16 1137 121/85 mmHg     Pulse Rate 03/10/16 1137 91     Resp 03/10/16 1137 18     Temp 03/10/16 1137 97.8 F (36.6 C)     Temp Source 03/10/16 1137 Oral     SpO2 03/10/16 1137 95 %     Weight 03/10/16 1137 140 lb (63.504 kg)     Height 03/10/16 1137 6' (1.829 m)   Constitutional: Alert and oriented. Well appearing and in no distress. Eyes: Conjunctivae are normal. PERRL. Normal extraocular movements. ENT   Head: Normocephalic and atraumatic.   Nose: No congestion/rhinnorhea.   Mouth/Throat: Mucous membranes are moist.   Neck: No  stridor. Hematological/Lymphatic/Immunilogical: No cervical lymphadenopathy. Cardiovascular: Normal rate, regular rhythm.  No murmurs, rubs, or gallops. Respiratory: Normal respiratory effort without tachypnea nor retractions. Breath sounds are clear and equal bilaterally. No wheezes/rales/rhonchi. Gastrointestinal: Soft and nontender. No distention.  Rectal: GUIAC positive. No red blood on glove. Musculoskeletal: Normal range of motion in all extremities. No joint effusions.  No lower extremity tenderness nor edema. Neurologic:  Normal speech and language. No gross focal neurologic deficits are appreciated.  Skin:  Skin is warm, dry and intact. No rash noted. Psychiatric: Mood and affect are normal. Speech and behavior are normal. Patient exhibits appropriate insight and judgment.  ____________________________________________    LABS (pertinent positives/negatives)  Labs Reviewed  CBC WITH DIFFERENTIAL/PLATELET - Abnormal; Notable for the following:    RBC 3.05 (*)    Hemoglobin 9.7 (*)    HCT 28.3 (*)    RDW 16.0 (*)    All other components within normal limits  COMPREHENSIVE METABOLIC PANEL - Abnormal; Notable for the following:    Sodium 134 (*)    Calcium 8.0 (*)    Total Protein 5.1 (*)    Albumin 2.2 (*)    AST 45 (*)    All other components within normal limits     ____________________________________________   EKG  None  ____________________________________________    RADIOLOGY  None  ____________________________________________   PROCEDURES  Procedure(s) performed: None  Critical Care performed: No  ____________________________________________   INITIAL IMPRESSION / ASSESSMENT AND PLAN / ED COURSE  Pertinent labs & imaging results that were available during my care of the patient were reviewed by me and considered in my medical decision making (see chart for details).  Patient presents to the emergency department today via EMS because of  concerns for failure to thrive. His hemoglobin was down compared to previous. Patient was guaiac positive. Will plan on giving patient Protonix admission for further workup and evaluation.  ____________________________________________   FINAL CLINICAL IMPRESSION(S) / ED DIAGNOSES  Final diagnoses:  Gastrointestinal hemorrhage, unspecified gastritis, unspecified gastrointestinal hemorrhage type     Note: This dictation was prepared with Dragon dictation. Any transcriptional errors that result from this process are unintentional    Phineas Semen, MD 03/10/16 1611

## 2016-03-11 LAB — CBC
HCT: 26.1 % — ABNORMAL LOW (ref 40.0–52.0)
Hemoglobin: 8.9 g/dL — ABNORMAL LOW (ref 13.0–18.0)
MCH: 32.4 pg (ref 26.0–34.0)
MCHC: 34.1 g/dL (ref 32.0–36.0)
MCV: 95 fL (ref 80.0–100.0)
Platelets: 293 10*3/uL (ref 150–440)
RBC: 2.75 MIL/uL — ABNORMAL LOW (ref 4.40–5.90)
RDW: 16.4 % — AB (ref 11.5–14.5)
WBC: 8.1 10*3/uL (ref 3.8–10.6)

## 2016-03-11 LAB — GLUCOSE, CAPILLARY: GLUCOSE-CAPILLARY: 92 mg/dL (ref 65–99)

## 2016-03-11 MED ORDER — PANTOPRAZOLE SODIUM 40 MG PO TBEC
40.0000 mg | DELAYED_RELEASE_TABLET | Freq: Two times a day (BID) | ORAL | Status: DC
  Administered 2016-03-11 – 2016-03-14 (×6): 40 mg via ORAL
  Filled 2016-03-11 (×6): qty 1

## 2016-03-11 MED ORDER — SUCRALFATE 1 GM/10ML PO SUSP
1.0000 g | Freq: Three times a day (TID) | ORAL | Status: DC
  Administered 2016-03-11 – 2016-03-14 (×7): 1 g via ORAL
  Filled 2016-03-11 (×7): qty 10

## 2016-03-11 MED ORDER — SODIUM CHLORIDE 0.9 % IV SOLN
INTRAVENOUS | Status: DC
  Administered 2016-03-11 – 2016-03-12 (×2): via INTRAVENOUS

## 2016-03-11 NOTE — Consult Note (Signed)
GI Inpatient Consult Note  Reason for Consult: Acute on chronic anemia   Attending Requesting Consult: Dr. Luberta Mutter  History of Present Illness: Luis [name] is a 65 y.o. male with a known history of chronic alcohol abuse (8-9 beers/day, last EtOH 10 days ago per patient), major depression, and GERD admitted with failure to thrive.  Other significant issues include chronic protein-calorie malnutrition.  Per review of provider notes, patient presented to the [facility] ED last evening via EMS due to a social worker's concern for failure to thrive.  EMS reported patient was sitting in feces and urine, unable to walk.  He reported he had not moved from the spot he was sitting in since arriving home from the hospital 2 days prior.  He was discharged from on 03/08/16 following hospitalization for acute metabolic encephalopathy, hyponatremia, and EtOH intoxication.  In the ED, labs demonstrated acute on chronic anemia with Hgb 9.7 (baseline 12 during recent hospitalization), HCT 28.3.  Iron studies showd iron 12 with ferritin and B12 WNL.  Stool was found to be guaiac positive.  Patient does not take an oral iron supplement at home.  Patient also exhibited significant difficulty with ambulation.  Protonix 40mg  po daily was initiated and a GI consultation was requested.  This morning, Hgb further decreased at 8.9, HCT 26.1.  Currently, patient denies any GI complaints.  He does endorse occasional intermittent LUQ discomfort.  He does not know of any aggrivating or alleviating factors.  No associated symptoms.  He takes 2 tabs Advil pm nightly for relief of chronic muscle/joint pain and sleep assistance.  He recalls having an EGD "many years ago", but does not recall the indication or result.  He underwent a colonoscopy last year (indic: CCA screening) which was "normal" per patient; he was told to plan for a repeat in 10 years.  He denies a known history of anemia.  No recent dysphagia, GERD symptoms,  nausea/vomiting, change in bowel habits (usually "runny"), hematochezia, or melena.    Past Medical History:  Past Medical History  Diagnosis Date  . GERD (gastroesophageal reflux disease)   . Alcohol abuse     Problem List: Patient Active Problem List   Diagnosis Date Noted  . Failure to thrive (0-17) 03/10/2016  . Protein-calorie malnutrition, severe 03/04/2016  . 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 Alcohol abuse 12/07/2015  . Delirium tremens (HCC) 12/07/2015  . Malnutrition of moderate degree 12/04/2015  . Hyponatremia 06/18/2015    Past Surgical History: Past Surgical History  Procedure Laterality Date  . Cholecystectomy      Allergies: No Known Allergies  Home Medications: Prescriptions prior to admission  Medication Sig Dispense Refill Last Dose  . esomeprazole (NEXIUM) 20 MG capsule Take 20 mg by mouth daily at 12 noon. Reported on 03/03/2016   unknown at unknown  . metoprolol tartrate (LOPRESSOR) 25 MG tablet Take 1 tablet (25 mg total) by mouth 2 (two) times daily. 60 tablet 2 unknown at unknown  . mirtazapine (REMERON) 30 MG tablet Take 1 tablet (30 mg total) by mouth at bedtime. 30 tablet 0 unknown at unknown  . nicotine (NICODERM CQ - DOSED IN MG/24 HOURS) 21 mg/24hr patch Place 1 patch (21 mg total) onto the skin daily as needed (if patient is current tobacco user and requests patch). 28 patch 0 unknown at unknown  . tamsulosin (FLOMAX) 0.4 MG CAPS capsule Take 1 capsule (0.4 mg total) by mouth daily. 30 capsule 2 unknown at unknown  .  feeding supplement, ENSURE ENLIVE, (ENSURE ENLIVE) LIQD Take 237 mLs by mouth 2 (two) times daily between meals. 237 mL 0    Home medication reconciliation was completed with the patient.   Scheduled Inpatient Medications:   . famotidine (PEPCID) IV  20 mg Intravenous Q12H  . feeding supplement (ENSURE ENLIVE)  237 mL Oral BID BM  . metoprolol tartrate  25 mg Oral BID  . mirtazapine   30 mg Oral QHS  . pantoprazole  40 mg Oral Daily  . tamsulosin  0.4 mg Oral Daily    Continuous Inpatient Infusions:     PRN Inpatient Medications:  acetaminophen **OR** acetaminophen, bisacodyl, ondansetron **OR** ondansetron (ZOFRAN) IV  Family History: family history includes Alcohol abuse in his father and mother.    Social History:   reports that he has been smoking.  He does not have any smokeless tobacco history on file. He reports that he drinks about 36.0 oz of alcohol per week. He reports that he does not use illicit drugs.   Review of Systems: Constitutional: Weight is stable.  Eyes: No changes in vision. ENT: No oral lesions, sore throat.  GI: see HPI.  Heme/Lymph: No easy bruising.  CV: No chest pain.  GU: No hematuria.  Integumentary: No rashes.  Neuro: No headaches.  Psych: No depression/anxiety.  Endocrine: No heat/cold intolerance.  Allergic/Immunologic: No urticaria.  Resp: No cough, SOB.  Musculoskeletal: No joint swelling.    Physical Examination: BP 151/88 mmHg  Pulse 80  Temp(Src) 98 F (36.7 C) (Oral)  Resp 18  Ht 6' (1.829 m)  Wt 63.231 kg (139 lb 6.4 oz)  BMI 18.90 kg/m2  SpO2 95% Gen: NAD, alert and oriented x 4 HEENT: PEERLA, EOMI, Neck: supple, no JVD or thyromegaly Chest: CTA bilaterally, no wheezes, crackles, or other adventitious sounds CV: RRR, no m/g/c/r Abd: soft, NT, ND, +BS in all four quadrants; no HSM, guarding, ridigity, or rebound tenderness Ext: no edema, well perfused with 2+ pulses, Skin: no rash or lesions noted Lymph: no LAD  Data: Lab Results  Component Value Date   WBC 8.1 03/11/2016   HGB 8.9* 03/11/2016   HCT 26.1* 03/11/2016   MCV 95.0 03/11/2016   PLT 293 03/11/2016    Recent Labs Lab 03/10/16 1317 03/11/16 0500  HGB 9.7* 8.9*   Lab Results  Component Value Date   NA 134* 03/10/2016   K 3.8 03/10/2016   CL 102 03/10/2016   CO2 24 03/10/2016   BUN 11 03/10/2016   CREATININE 0.84 03/10/2016    Lab Results  Component Value Date   ALT 38 03/10/2016   AST 45* 03/10/2016   ALKPHOS 123 03/10/2016   BILITOT 0.4 03/10/2016   No results for input(s): APTT, INR, PTT in the last 168 hours.   Assessment/Plan: [name] is a 65 y.o. male with a known history of chronic alcohol abuse (8-9 beers/day, last EtOH 10 days ago per patient), major depression, and GERD admitted with failure to thrive.  Hgb decreased from baseline 12 to 9.7 -- 8.9.  Iron 12, otherwise labs inconsistent with IDA.  BUN also WNL. Likely upper GI bleed from an ulcer or gastritis, given significant EtOH and NSAID use. Will plan for EGD tomorrow per Dr. Mechele CollinElliott - see his note for additional recommendations.  Also recommend Carafate slurry TID in addition to Protonix.  Will order clear liquid diet tonight, then NPO after midnight.  Recommendations: - EGD tomorrow per Dr. Mechele CollinElliott - clear liquids tonight, then NPO  after midnight. - Monitor Hgb, transfuse if <7 - Continue Protonix  daily - Begin Caraftate 1g slurry TID - Educated patient to avoid NSAIDs, continue EtOH abstinence  Thank you for the consult. We will follow along with you. Please call with questions or concerns.  Burman Freestone, PA-C [facility]astro[facility] Phone: [phone] Pager: [phone]

## 2016-03-11 NOTE — Consult Note (Signed)
Patient with chronic alcoholism who likely has some UGI bleed with signif drop in hgb.  Will schedule EGD for tomorrow because of his blood count fall from 12 to 9.7 in a week and heme positive stool.

## 2016-03-11 NOTE — Progress Notes (Signed)
Initial Nutrition Assessment  DOCUMENTATION CODES:   Severe malnutrition in context of social or environmental circumstances  INTERVENTION:  -Monitor diet progression and intake -Agree with Ensure Enlive po BID, each supplement provides 350 kcal and 20 grams of protein for additional kcals and protein    NUTRITION DIAGNOSIS:   Malnutrition related to social / environmental circumstances as evidenced by percent weight loss, severe depletion of muscle mass.    GOAL:   Patient will meet greater than or equal to 90% of their needs    MONITOR:   PO intake, Supplement acceptance, Diet advancement  REASON FOR ASSESSMENT:   Malnutrition Screening Tool    ASSESSMENT:      Pt admitted with FTT, GI bleed, anemia. Pt has a history of hyponatremia, chronic etoh abuse. GI to evaluate.  Pt reports good appetite prior to admission, typically eats 2 meals per day (mostly soups and grilled cheese sandwiches per pt). Also noted to be chronic alcohol drinker  Medications reviewed: protonix  Labs reviewed: Na 134, hgb 8.9  Nutrition-Focused physical exam completed. Findings are moderate fat depletion, moderate to severe muscle depletion, and no edema.     Diet Order:  Diet NPO time specified  Skin:   (pressure ulcer stage II on sacrum)  Last BM:  6/14  Height:   Ht Readings from Last 1 Encounters:  03/10/16 6' (1.829 m)    Weight: 18% wt loss in the last 8 months  Wt Readings from Last 1 Encounters:  03/11/16 139 lb 6.4 oz (63.231 kg)    Ideal Body Weight:     BMI:  Body mass index is 18.9 kg/(m^2).  Estimated Nutritional Needs:   Kcal:  [id number] kcals/d  Protein:  76-95 g/d  Fluid:  >/= 1.8 L/d  EDUCATION NEEDS:   No education needs identified at this time  Averiana Clouatre B. Freida BusmanAllen, RD, LDN [phone] (pager) Weekend/On-Call pager [phone])

## 2016-03-11 NOTE — Progress Notes (Addendum)
Patient ID: Luis [name], male   DOB: 02/14/1951, 65 y.o.   MRN: [id number] [employer] PROGRESS NOTE  Luis [name] JXB:[id number] DOB: 07/16/1951 DOA: 03/10/2016 PCP: No PCP Per Patient  HPI/Subjective: Patient upset that he was made nothing by mouth. Came in with a fall. Was recently in the hospital. Feels very weak. Found to have guaiac positive stool and anemia.  Objective: Filed Vitals:   03/11/16 0420 03/11/16 1330  BP: 151/88 146/81  Pulse: 80 80  Temp: 98 F (36.7 C) 98 F (36.7 C)  Resp: 18 18    Filed Weights   03/10/16 1137 03/11/16 0401  Weight: 63.504 kg (140 lb) 63.231 kg (139 lb 6.4 oz)    ROS: Review of Systems  Constitutional: Negative for fever and chills.  Eyes: Negative for blurred vision.  Respiratory: Negative for cough and shortness of breath.   Cardiovascular: Negative for chest pain.  Gastrointestinal: Negative for nausea, vomiting, abdominal pain, diarrhea and constipation.  Genitourinary: Negative for dysuria.  Musculoskeletal: Negative for joint pain.  Neurological: Positive for weakness. Negative for dizziness and headaches.   Exam: Physical Exam  Constitutional: He is oriented to person, place, and time.  HENT:  Nose: No mucosal edema.  Mouth/Throat: No oropharyngeal exudate or posterior oropharyngeal edema.  Eyes: Conjunctivae, EOM and lids are normal. Pupils are equal, round, and reactive to light.  Neck: No JVD present. Carotid bruit is not present. No edema present. No thyroid mass and no thyromegaly present.  Cardiovascular: S1 normal and S2 normal.  Exam reveals no gallop.   No murmur heard. Pulses:      Dorsalis pedis pulses are 2+ on the right side, and 2+ on the left side.  Respiratory: No respiratory distress. He has no wheezes. He has no rhonchi. He has no rales.  GI: Soft. Bowel sounds are normal. There is no tenderness.  Musculoskeletal:       Right ankle: He exhibits no swelling.       Left ankle: He exhibits no  swelling.  Lymphadenopathy:    He has no cervical adenopathy.  Neurological: He is alert and oriented to person, place, and time. No cranial nerve deficit.  Skin: Skin is warm. Nails show no clubbing.  Stage on buutock as per nurse  Psychiatric: He has a normal mood and affect.      Data Reviewed: Basic Metabolic Panel:  Recent Labs Lab 03/05/16 0152 03/05/16 0728 03/06/16 0531 03/07/16 0416 03/10/16 1317  NA 127* 129* 133* 135 134*  K 4.2  --  3.6 3.8 3.8  CL 100*  --  107 109 102  CO2 22  --  21* 22 24  GLUCOSE 93  --  94 116* 84  BUN 14  --  10 9 11   CREATININE 0.78  --  0.55* 0.76 0.84  CALCIUM 7.6*  --  7.7* 7.9* 8.0*  MG 1.6*  --  1.6* 1.5*  --   PHOS 2.3*  --  3.6 3.2  --    Liver Function Tests:  Recent Labs Lab 03/10/16 1317  AST 45*  ALT 38  ALKPHOS 123  BILITOT 0.4  PROT 5.1*  ALBUMIN 2.2*   CBC:  Recent Labs Lab 03/10/16 1317 03/11/16 0500  WBC 7.6 8.1  NEUTROABS 5.2  --   HGB 9.7* 8.9*  HCT 28.3* 26.1*  MCV 92.9 95.0  PLT 289 293    CBG:  Recent Labs Lab 03/11/16 0732  GLUCAP 92   Scheduled Meds: . feeding supplement (  ENSURE ENLIVE)  237 mL Oral BID BM  . metoprolol tartrate  25 mg Oral BID  . mirtazapine  30 mg Oral QHS  . pantoprazole  40 mg Oral BID  . tamsulosin  0.4 mg Oral Daily    Assessment/Plan:  1. Acute drop in hemoglobin with guaiac positive stools. I will keep nothing by mouth until GI sees the patient. Continue PPI. Serial hemoglobins. 2. Generalized weakness and falls. We'll get physical therapy evaluation. Alcohol can affect the cerebellum and balance. 3. Failure to thrive and malnutrition. 4.   Depression on Remeron 5.   Stage II decubiti on buttock  Code Status:     Code Status Orders        Start     Ordered   03/10/16 1641  Full code   Continuous     03/10/16 1642    Code Status History    Date Active Date Inactive Code Status Order ID Comments User Context   03/03/2016 11:09 PM 03/08/2016   4:01 PM Full Code [id number]  Gery Pray, MD Inpatient   12/03/2015  2:39 PM 12/09/2015 10:24 PM Full Code [id number]  Katha Hamming, MD ED   06/18/2015 10:38 AM 06/20/2015  2:52 PM Full Code [id number]  Alford Highland, MD ED     Disposition Plan: To be determined  Consultants:  Gastroenterology  Time spent: 25 minutes  Alford Highland  [employer]

## 2016-03-11 NOTE — Care Management Note (Signed)
Case Management Note  Patient Details  Name: Leanna SatoMichael Dorner MRN: [id number] Date of Birth: 07/22/1951  Subjective/Objective:                 Patient admitted for anemia with guaiac-positive stools.  Patient lives at home alone at [facility]. [facility].  At that time he was assessed by PT and discharged with a RW and home health services through [facility].  RN, PT, and aide were ordered at discharge.  I have notified Barbara CowerJason with [facility] of patient's admission.  Per Barbara CowerJason case has not yet been open due to not being able to contact patient.    Patient presented to the hospital this admission after sitting his his own feces and urine due to "not being able to walk"., PT consult has been placed.  [facility].  Patient has declined [facility] transfer which he has in previous admissions.  Refusal form, face sheet, and H[place] [facility] [place] at [phone].  Fax confirmation received.    Action/Plan: RNCM following for discharge planning  Expected Discharge Date:                  Expected Discharge Plan:     In-House Referral:     Discharge planning Services     Post Acute Care Choice:    Choice offered to:     DME Arranged:    DME Agency:     HH Arranged:    HH Agency:     Status of Service:     Medicare Important Message Given:    Date Medicare IM Given:    Medicare IM give by:    Date Additional Medicare IM Given:    Additional Medicare Important Message give by:     If discussed at Long Length of Stay Meetings, dates discussed:    Additional Comments:  Chapman FitchBOWEN, Miela Desjardin T, RN 03/11/2016, 2:09 PM

## 2016-03-12 ENCOUNTER — Encounter: Payer: Self-pay | Admitting: *Deleted

## 2016-03-12 ENCOUNTER — Observation Stay: Payer: Medicare HMO | Admitting: Anesthesiology

## 2016-03-12 ENCOUNTER — Encounter
Admission: EM | Disposition: A | Discharge status: Home / Self Care | Payer: Self-pay | Source: Home / Self Care | Attending: Emergency Medicine

## 2016-03-12 DIAGNOSIS — L8990 Pressure ulcer of unspecified site, unspecified stage: Secondary | ICD-10-CM | POA: Insufficient documentation

## 2016-03-12 HISTORY — PX: ESOPHAGOGASTRODUODENOSCOPY: SHX5428

## 2016-03-12 LAB — HEMOGLOBIN: Hemoglobin: 10.8 g/dL — ABNORMAL LOW (ref 13.0–18.0)

## 2016-03-12 SURGERY — EGD (ESOPHAGOGASTRODUODENOSCOPY)
Anesthesia: General

## 2016-03-12 MED ORDER — ZOLPIDEM TARTRATE 5 MG PO TABS
5.0000 mg | ORAL_TABLET | Freq: Every evening | ORAL | Status: DC | PRN
  Administered 2016-03-12 – 2016-03-13 (×2): 5 mg via ORAL
  Filled 2016-03-12 (×2): qty 1

## 2016-03-12 MED ORDER — PROPOFOL 500 MG/50ML IV EMUL
INTRAVENOUS | Status: DC | PRN
  Administered 2016-03-12: 100 ug/kg/min via INTRAVENOUS

## 2016-03-12 MED ORDER — PROPOFOL 10 MG/ML IV BOLUS
INTRAVENOUS | Status: DC | PRN
  Administered 2016-03-12: 50 mg via INTRAVENOUS

## 2016-03-12 MED ORDER — IPRATROPIUM-ALBUTEROL 0.5-2.5 (3) MG/3ML IN SOLN
RESPIRATORY_TRACT | Status: AC
  Filled 2016-03-12: qty 3

## 2016-03-12 MED ORDER — IPRATROPIUM-ALBUTEROL 0.5-2.5 (3) MG/3ML IN SOLN: 3.0000 mL | Freq: Four times a day (QID) | RESPIRATORY_TRACT | Status: DC

## 2016-03-12 NOTE — Progress Notes (Signed)
Patient ID: Luis [name], male   DOB: [birth year], 65 y.o.   MRN: [id number] [employer] PROGRESS NOTE  Luis [name] [id number]:[id number] DOB: 04-Jan-1951 DOA: 03/10/2016 PCP: No PCP Per Patient  HPI/Subjective: Seen at the bedside. Denies any complaints. Says overall he feels stronger. EGD showed  3 ulcers in the stomach, duodenitis. Started on full liquids by GI. Objective: Filed Vitals:   03/12/16 0850 03/12/16 0935  BP: 144/82 155/87  Pulse: 79 77  Temp:  97.7 F (36.5 C)  Resp: 18 20    Filed Weights   03/10/16 1137 03/11/16 0401 03/12/16 0605  Weight: 63.504 kg (140 lb) 63.231 kg (139 lb 6.4 oz) 67.178 kg (148 lb 1.6 oz)    ROS: Review of Systems  Constitutional: Negative for fever and chills.  Eyes: Negative for blurred vision.  Respiratory: Negative for cough and shortness of breath.   Cardiovascular: Negative for chest pain.  Gastrointestinal: Negative for nausea, vomiting, abdominal pain, diarrhea and constipation.  Genitourinary: Negative for dysuria.  Musculoskeletal: Negative for joint pain.  Neurological: Positive for weakness. Negative for dizziness and headaches.   Exam: Physical Exam  Constitutional: He is oriented to person, place, and time.  HENT:  Nose: No mucosal edema.  Mouth/Throat: No oropharyngeal exudate or posterior oropharyngeal edema.  Eyes: Conjunctivae, EOM and lids are normal. Pupils are equal, round, and reactive to light.  Neck: No JVD present. Carotid bruit is not present. No edema present. No thyroid mass and no thyromegaly present.  Cardiovascular: S1 normal and S2 normal.  Exam reveals no gallop.   No murmur heard. Pulses:      Dorsalis pedis pulses are 2+ on the right side, and 2+ on the left side.  Respiratory: No respiratory distress. He has no wheezes. He has no rhonchi. He has no rales.  GI: Soft. Bowel sounds are normal. There is no tenderness.  Musculoskeletal:       Right ankle: He exhibits no swelling.       Left ankle: He  exhibits no swelling.  Lymphadenopathy:    He has no cervical adenopathy.  Neurological: He is alert and oriented to person, place, and time. No cranial nerve deficit.  Skin: Skin is warm. Nails show no clubbing.  Stage on buutock as per nurse  Psychiatric: He has a normal mood and affect.      Data Reviewed: Basic Metabolic Panel:  Recent Labs Lab 03/06/16 0531 03/07/16 0416 03/10/16 1317  NA 133* 135 134*  K 3.6 3.8 3.8  CL 107 109 102  CO2 21* 22 24  GLUCOSE 94 116* 84  BUN CREATININE 0.55* 0.76 0.84  CALCIUM 7.7* 7.9* 8.0*  MG 1.6* 1.5*  --   PHOS 3.6 3.2  --    Liver Function Tests:  Recent Labs Lab 03/10/16 1317  AST 45*  ALT 38  ALKPHOS 123  BILITOT 0.4  PROT 5.1*  ALBUMIN 2.2*   CBC:  Recent Labs Lab 03/10/16 1317 03/11/16 0500 03/12/16 0514  WBC 7.6 8.1  --   NEUTROABS 5.2  --   --   HGB 9.7* 8.9* 10.8*  HCT 28.3* 26.1*  --   MCV 92.9 95.0  --   PLT 289 293  --     CBG:  Recent Labs Lab 03/11/16 0732  GLUCAP 92   Scheduled Meds: . feeding supplement (ENSURE ENLIVE)  237 mL Oral BID BM  . ipratropium-albuterol      . metoprolol tartrate  25 mg Oral  BID  . mirtazapine  30 mg Oral QHS  . pantoprazole  40 mg Oral BID  . sucralfate  1 g Oral TID AC  . tamsulosin  0.4 mg Oral Daily    Assessment/Plan:  1. Acute on chronic anemia secondary to GI bleeding from gastric ulcers and duodenitis: Did not require transfusions. Patient did have EGD on this admission showing 3 ulcers in the stomach somewhat superficial, no recent bleeding . Continue Carafate, PPIs. Started on full liquids by GI, continue full liquids today. 2. Severe malnutrition: Patient is seen by dietary, continue an sure.  3. Generalized weakness and falls. Follow physical therapy recommendation, possible placement. 4.  Failure to thrive and malnutrition. 4.   Depression on Remeron 5.   Stage II decubiti on buttock  Code Status:     Code Status Orders         Start     Ordered   03/10/16 1641  Full code   Continuous     03/10/16 1642    Code Status History    Date Active Date Inactive Code Status Order ID Comments User Context   03/03/2016 11:09 PM 03/08/2016  4:01 PM Full Code [id number]  Gery Prayebby Crosley, MD Inpatient   12/03/2015  2:39 PM 12/09/2015 10:24 PM Full Code [id number]  Katha HammingSnehalatha Virgina Deakins, MD ED   06/18/2015 10:38 AM 06/20/2015  2:52 PM Full Code [id number]  Alford Highlandichard Wieting, MD ED     Disposition Plan: To be determined  Consultants:  Gastroenterology  Time spent: 25 minutes  Apple ComputerKONIDENA,Luis Wible  [employer]
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
## 2016-03-12 NOTE — [place] FL2 (Signed)
[place] MEDICAID FL2 LEVEL OF CARE SCREENING TOOL     IDENTIFICATION  Patient Name: Luis [name] Birthdate: 12/20/1950 Sex: male Admission Date (Current Location): 03/10/2016  [place]   Facility and Address:  [facility], [address], [place], [place] [address]      Provider Number: [id number]  Attending Physician Name and Address:  Katha HammingSnehalatha Konidena, MD  Relative Name and Phone Number:       Current Level of Care: Hospital Recommended Level of Care: Skilled Nursing Facility Prior Approval Number:    Date Approved/Denied:   PASRR Number:  [id number])  Discharge Plan: SNF    Current Diagnoses: Patient Active Problem List   Diagnosis Date Noted  . Pressure ulcer 03/12/2016  . Failure to thrive (0-17) 03/10/2016  . Protein-calorie malnutrition, severe 03/04/2016  . 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 Alcohol abuse 12/07/2015  . Delirium tremens (HCC) 12/07/2015  . Malnutrition of moderate degree 12/04/2015  . Hyponatremia 06/18/2015    Orientation RESPIRATION BLADDER Height & Weight     Self, Time, Situation, Place  Normal Continent Weight: 148 lb 1.6 oz (67.178 kg) Height:  6' (182.9 cm)  BEHAVIORAL SYMPTOMS/MOOD NEUROLOGICAL BOWEL NUTRITION STATUS   (none )  (none ) Incontinent Diet (Diet: Full Liquid )  AMBULATORY STATUS COMMUNICATION OF NEEDS Skin   Extensive Assist Verbally PU Stage and Appropriate Care (Pressure Ulcer Stage 2: Sacrum. )                       Personal Care Assistance Level of Assistance  Bathing, Feeding, Dressing Bathing Assistance: Limited assistance Feeding assistance: Independent Dressing Assistance: Limited assistance     Functional Limitations Info  Sight, Hearing, Speech Sight Info: Adequate Hearing Info: Adequate Speech Info: Adequate    SPECIAL CARE FACTORS FREQUENCY  PT (By licensed PT), OT (By licensed OT)      PT Frequency:  (5) OT Frequency:  (5)            Contractures      Additional Factors Info  Code Status, Allergies Code Status Info:  (Full Code. ) Allergies Info:  (No Known Allergies. )           Current Medications (03/12/2016):  This is the current hospital active medication list Current Facility-Administered Medications  Medication Dose Route Frequency Provider Last Rate Last Dose  . acetaminophen (TYLENOL) tablet 650 mg  650 mg Oral Q6H PRN Katha HammingSnehalatha Konidena, MD   650 mg at 03/12/16 0009   Or  . acetaminophen (TYLENOL) suppository 650 mg  650 mg Rectal Q6H PRN Katha HammingSnehalatha Konidena, MD      . bisacodyl (DULCOLAX) EC tablet 5 mg  5 mg Oral Daily PRN Katha HammingSnehalatha Konidena, MD      . feeding supplement (ENSURE ENLIVE) (ENSURE ENLIVE) liquid 237 mL  237 mL Oral BID BM Katha HammingSnehalatha Konidena, MD   237 mL at 03/12/16 1033  . ipratropium-albuterol (DUONEB) 0.5-2.5 (3) MG/3ML nebulizer solution           . metoprolol tartrate (LOPRESSOR) tablet 25 mg  25 mg Oral BID Katha HammingSnehalatha Konidena, MD   25 mg at 03/12/16 1033  . mirtazapine (REMERON) tablet 30 mg  30 mg Oral QHS Katha HammingSnehalatha Konidena, MD   30 mg at 03/11/16 2102  . ondansetron (ZOFRAN) tablet 4 mg  4 mg Oral Q6H PRN Katha HammingSnehalatha Konidena, MD       Or  . ondansetron (  ZOFRAN) injection 4 mg  4 mg Intravenous Q6H PRN Katha Hamming, MD      . pantoprazole (PROTONIX) EC tablet 40 mg  40 mg Oral BID Alford Highland, MD   40 mg at 03/12/16 1033  . sucralfate (CARAFATE) 1 GM/10ML suspension 1 g  1 g Oral TID AC Michelle C Johnson, PA-C   1 g at 03/12/16 1222  . tamsulosin (FLOMAX) capsule 0.4 mg  0.4 mg Oral Daily Katha Hamming, MD   0.4 mg at 03/12/16 1033     Discharge Medications: Please see discharge summary for a list of discharge medications.  Relevant Imaging Results:  Relevant Lab Results:   Additional Information  (SSN: [id number])  Haig Prophet, LCSW

## 2016-03-12 NOTE — Transfer of Care (Signed)
Immediate Anesthesia Transfer of Care Note  Patient: Luis SatoMichael [name]  Procedure(s) Performed: Procedure(s): ESOPHAGOGASTRODUODENOSCOPY (EGD) (N/A)  Patient Location: PACU  Anesthesia Type:General  Level of Consciousness: sedated  Airway & Oxygen Therapy: Patient Spontanous Breathing and Patient connected to nasal cannula oxygen  Post-op Assessment: Report given to RN and Post -op Vital signs reviewed and stable  Post vital signs: Reviewed and stable  Last Vitals:  Filed Vitals:   03/12/16 0451 03/12/16 0752  BP: 142/80 168/93  Pulse: 73 75  Temp: 36.6 C   Resp: 18 20    Last Pain:  Filed Vitals:   03/12/16 0754  PainSc: 0-No pain         Complications: No apparent anesthesia complications

## 2016-03-12 NOTE — Op Note (Signed)
[facility] Gastroenterology Patient Name: Luis SatoMichael Profit Procedure Date: 03/12/2016 7:42 AM MRN: [id number] Account #: [id number] Date of Birth: 07/05/1951 Admit Type: Inpatient Age: 89 [facility] 4 Gender: Male Note Status: Finalized Procedure:            Upper GI endoscopy Indications:          Acute post hemorrhagic anemia, Heme positive stool Providers:            Scot Junobert T. Lesslie Mckeehan, MD Medicines:            Propofol per Anesthesia Complications:        No immediate complications. Procedure:            Pre-Anesthesia Assessment:                       - After reviewing the risks and benefits, the patient                        was deemed in satisfactory condition to undergo the                        procedure.                       After obtaining informed consent, the endoscope was                        passed under direct vision. Throughout the procedure,                        the patient's blood pressure, pulse, and oxygen                        saturations were monitored continuously. The Endoscope                        was introduced through the mouth, and advanced to the                        second part of duodenum. The upper GI endoscopy was                        accomplished without difficulty. The patient tolerated                        the procedure well. Findings:      The examined esophagus was normal.      A total of Three non-bleeding cratered gastric ulcers with no stigmata       of bleeding were found in the gastric antrum. The largest lesion was 20       mm in largest dimension.      Two of the three non-bleeding cratered gastric ulcers with no stigmata       of bleeding were found in the gastric antrum. The largest lesion was 5       mm in largest dimension.      Diffuse moderate inflammation characterized by erythema and granularity       was found in the duodenal bulb.      No bleeding seen anywhere. Impression:            - Normal esophagus.                       -  Non-bleeding gastric ulcers with no stigmata of                        bleeding.                       - Non-bleeding gastric ulcers with no stigmata of                        bleeding.                       - Duodenitis.                       - No specimens collected. Recommendation:       - The findings and recommendations were discussed with                        the patient. Full liquid diet, add Carafate liquid to                        his treatment. Trry for placement at [facility] if he really                        was in service. Scot Jun, MD 03/12/2016 8:24:12 AM This report has been signed electronically. Number of Addenda: 0 Note Initiated On: 03/12/2016 7:42 AM      [facility]
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
## 2016-03-12 NOTE — Consult Note (Signed)
Patient had EGD showing 3 ulcers in stomach, somewhat superficial with largest 2cm in size.  No sign of recent bleeding, no melena, no blood. Significant  Duodenitis also seen.  Will start carafate slurry and full liquid diet.  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  Will need PT to evaluate him and see if he can ambulate at all.

## 2016-03-12 NOTE — Anesthesia Postprocedure Evaluation (Signed)
Anesthesia Post Note  Patient: Luis SatoMichael Beck  Procedure(s) Performed: Procedure(s) (LRB): ESOPHAGOGASTRODUODENOSCOPY (EGD) (N/A)  Patient location during evaluation: Endoscopy Anesthesia Type: General Level of consciousness: awake and alert Pain management: pain level controlled Vital Signs Assessment: post-procedure vital signs reviewed and stable Respiratory status: spontaneous breathing, nonlabored ventilation, respiratory function stable and patient connected to nasal cannula oxygen Cardiovascular status: blood pressure returned to baseline and stable Postop Assessment: no signs of nausea or vomiting Anesthetic complications: no    Last Vitals:  Filed Vitals:   03/12/16 0850 03/12/16 0935  BP: 144/82 155/87  Pulse: 79 77  Temp:  36.5 C  Resp: 18 20    Last Pain:  Filed Vitals:   03/12/16 0935  PainSc: Asleep                 Lenard SimmerAndrew Broxton Broady

## 2016-03-12 NOTE — Anesthesia Preprocedure Evaluation (Addendum)
Anesthesia Evaluation  Patient identified by MRN, date of birth, ID band Patient awake    Reviewed: Allergy & Precautions, NPO status , Patient's Chart, lab work & pertinent test results  Airway Mallampati: II  TM Distance: >3 FB     Dental  (+) Poor Dentition, Missing, Loose, Dental Advisory Given   Pulmonary shortness of breath and at rest, COPD, Current Smoker,   Nebulizer treatment preop   + wheezing      Cardiovascular (-) hypertension(-) angina(-) Past MI negative cardio ROS   Rhythm:Regular Rate:Normal     Neuro/Psych PSYCHIATRIC DISORDERS (Depression and h/o DTs) Depression negative neurological ROS     GI/Hepatic GERD  Poorly Controlled,(+)     substance abuse  alcohol use, ETOH Use   Endo/Other  negative endocrine ROS  Renal/GU negative Renal ROS     Musculoskeletal negative musculoskeletal ROS (+)   Abdominal Normal abdominal exam  (+)   Peds  Hematology  (+) anemia ,   Anesthesia Other Findings Past Medical History:   GERD (gastroesophageal reflux disease)                       Alcohol abuse                                                Reproductive/Obstetrics negative OB ROS                            Anesthesia Physical Anesthesia Plan  ASA: III and emergent  Anesthesia Plan: General   Post-op Pain Management:    Induction: Intravenous  Airway Management Planned:   Additional Equipment:   Intra-op Plan:   Post-operative Plan:   Informed Consent: I have reviewed the patients History and Physical, chart, labs and discussed the procedure including the risks, benefits and alternatives for the proposed anesthesia with the patient or authorized representative who has indicated his/her understanding and acceptance.   Dental advisory given  Plan Discussed with: CRNA, Anesthesiologist and Surgeon  Anesthesia Plan Comments:         Anesthesia Quick  Evaluation

## 2016-03-12 NOTE — Clinical Social Work Placement (Signed)
   CLINICAL SOCIAL WORK PLACEMENT  NOTE  Date:  03/12/2016  Patient Details  Name: Luis [name] MRN: [id number] Date of Birth: 07/27/1951  Clinical Social Work is seeking post-discharge placement for this patient at the Skilled  Nursing Facility level of care (*CSW will initial, date and re-position this form in  chart as items are completed):  Yes   Patient/family provided with [place] Clinical Social Work Department's list of facilities offering this level of care within the geographic area requested by the patient (or if unable, by the patient's family).  Yes   Patient/family informed of their freedom to choose among providers that offer the needed level of care, that participate in Medicare, Medicaid or managed care program needed by the patient, have an available bed and are willing to accept the patient.  Yes   Patient/family informed of [facility]'s ownership interest in [facility] and [facility], as well as of the fact that they are under no obligation to receive care at these facilities.  PASRR submitted to EDS on 03/12/16     PASRR number received on 03/12/16     Existing PASRR number confirmed on       FL2 transmitted to all facilities in geographic area requested by pt/family on 03/12/16     FL2 transmitted to all facilities within larger geographic area on       Patient informed that his/her managed care company has contracts with or will negotiate with certain facilities, including the following:            Patient/family informed of bed offers received.  Patient chooses bed at       Physician recommends and patient chooses bed at      Patient to be transferred to   on  .  Patient to be transferred to facility by       Patient family notified on   of transfer.  Name of family member notified:        PHYSICIAN       Additional Comment:    _______________________________________________ Luis [name], Luis Benham G, LCSW 03/12/2016, 12:39 PM

## 2016-03-13 LAB — GLUCOSE, CAPILLARY: Glucose-Capillary: 80 mg/dL (ref 65–99)

## 2016-03-13 NOTE — Progress Notes (Signed)
Patient ID: Luis SatoMichael Telleria, male   DOB: 09/15/1951, 65 y.o.   MRN: [id number] [facility] PROGRESS NOTE  Luis SatoMichael Sine NWG:[id number] DOB: 01/30/1951 DOA: 03/10/2016 PCP: No PCP Per Patient  HPI/Subjective Seen at bedside. Asking for regular food. No abdominal pain, no shortness of breath.  Objective: Filed Vitals:   03/12/16 2211 03/13/16 0538  BP: 144/95 158/83  Pulse: 92 86  Temp: 98.1 F (36.7 C) 98.4 F (36.9 C)  Resp: 20 22    Filed Weights   03/10/16 1137 03/11/16 0401 03/12/16 0605  Weight: 63.504 kg (140 lb) 63.231 kg (139 lb 6.4 oz) 67.178 kg (148 lb 1.6 oz)    ROS: Review of Systems  Constitutional: Negative for fever and chills.  Eyes: Negative for blurred vision.  Respiratory: Negative for cough and shortness of breath.   Cardiovascular: Negative for chest pain.  Gastrointestinal: Negative for nausea, vomiting, abdominal pain, diarrhea and constipation.  Genitourinary: Negative for dysuria.  Musculoskeletal: Negative for joint pain.  Neurological: Positive for weakness. Negative for dizziness and headaches.   Exam: Physical Exam  Constitutional: He is oriented to person, place, and time.  HENT:  Nose: No mucosal edema.  Mouth/Throat: No oropharyngeal exudate or posterior oropharyngeal edema.  Eyes: Conjunctivae, EOM and lids are normal. Pupils are equal, round, and reactive to light.  Neck: No JVD present. Carotid bruit is not present. No edema present. No thyroid mass and no thyromegaly present.  Cardiovascular: S1 normal and S2 normal.  Exam reveals no gallop.   No murmur heard. Pulses:      Dorsalis pedis pulses are 2+ on the right side, and 2+ on the left side.  Respiratory: No respiratory distress. He has no wheezes. He has no rhonchi. He has no rales.  GI: Soft. Bowel sounds are normal. There is no tenderness.  Musculoskeletal:       Right ankle: He exhibits no swelling.       Left ankle: He exhibits no swelling.  Lymphadenopathy:    He has no  cervical adenopathy.  Neurological: He is alert and oriented to person, place, and time. No cranial nerve deficit.  Skin: Skin is warm. Nails show no clubbing.  Stage on buutock as per nurse  Psychiatric: He has a normal mood and affect.      Data Reviewed: Basic Metabolic Panel:  Recent Labs Lab 03/07/16 0416 03/10/16 1317  NA 135 134*  K 3.8 3.8  CL 109 102  CO2 22 24  GLUCOSE 116* 84  BUN 9 11  CREATININE 0.76 0.84  CALCIUM 7.9* 8.0*  MG 1.5*  --   PHOS 3.2  --    Liver Function Tests:  Recent Labs Lab 03/10/16 1317  AST 45*  ALT 38  ALKPHOS 123  BILITOT 0.4  PROT 5.1*  ALBUMIN 2.2*   CBC:  Recent Labs Lab 03/10/16 1317 03/11/16 0500 03/12/16 0514  WBC 7.6 8.1  --   NEUTROABS 5.2  --   --   HGB 9.7* 8.9* 10.8*  HCT 28.3* 26.1*  --   MCV 92.9 95.0  --   PLT 289 293  --     CBG:  Recent Labs Lab 03/11/16 0732 03/13/16 0741  GLUCAP 92 80   Scheduled Meds: . feeding supplement (ENSURE ENLIVE)  237 mL Oral BID BM  . metoprolol tartrate  25 mg Oral BID  . mirtazapine  30 mg Oral QHS  . pantoprazole  40 mg Oral BID  . sucralfate  1 g Oral TID  AC  . tamsulosin  0.4 mg Oral Daily    Assessment/Plan:  1. Acute on chronic anemia secondary to GI bleeding from gastric ulcers and duodenitis: Did not require transfusions. Patient did have EGD showing 3 ulcers in the stomach somewhat superficial, no recent bleeding . Continue Carafate, PPIs. Patient is requesting to regular food, start  regular food. Hemoglobin is stable. 2. Severe malnutrition: Patient is seen by dietary, continue nutritional supplements.  3. Generalized weakness and falls. She needs placement because of recurrent admissions with falls and deconditioning. 4.  Failure to thrive and malnutrition. 4.   Depression on Remeron 5.   Stage II decubiti on buttock    Code Status:     Code Status Orders        Start     Ordered   03/10/16 1641  Full code   Continuous     03/10/16  1642    Code Status History    Date Active Date Inactive Code Status Order ID Comments User Context   03/03/2016 11:09 PM 03/08/2016  4:01 PM Full Code [id number]  Gery Pray, MD Inpatient   12/03/2015  2:39 PM 12/09/2015 10:24 PM Full Code [id number]  Katha Hamming, MD ED   06/18/2015 10:38 AM 06/20/2015  2:52 PM Full Code [id number]  Alford Highland, MD ED     Disposition Plan: To be determined  Consultants:  Gastroenterology  Time spent: 25 minutes  [employer]

## 2016-03-13 NOTE — Plan of Care (Signed)
Problem: Bowel/Gastric: Goal: Will not experience complications related to bowel motility Outcome: Not Progressing Pt had incident of incontinence with watery stool this morning.

## 2016-03-13 NOTE — Progress Notes (Signed)
Clinical Social Worker (CSW) met with patient to discuss D/C plan. Patient walked 170 feet and PT is recommending home health. CSW explained to patient that his insurance [employer] will not pay for him to go to a SNF for rehab and he does not qualify. Patient verbalized his understanding and asked to stay in the hospital until Wednesday so he could get stronger before going home. CSW explained that MD will make the decision when patient is medically stable for D/C. CSW also discussed other placement options like a family care home or group home. Patient adamantly refused those placements because he is not willing to give up is social security check. Patient reported that he will go home and pay his own bills and not give up his check. At this point there are no placement options for patient and he will have to return home with home health. APS report has been made. CSW will continue to follow and assist as needed.   Blima Rich, LCSW [phone]

## 2016-03-13 NOTE — Consult Note (Signed)
Patient requesting more food.  Will advance him to scrambled eggs and see how he does. Hgb stable at 10.8  VSS afebrile, chest clear, heart RRR abd flat, non tender.  Making progress, needs placement.

## 2016-03-14 ENCOUNTER — Encounter: Payer: Self-pay | Admitting: Unknown Physician Specialty

## 2016-03-14 LAB — GLUCOSE, CAPILLARY: GLUCOSE-CAPILLARY: 95 mg/dL (ref 65–99)

## 2016-03-14 NOTE — Progress Notes (Signed)
Clinical Social Worker (CSW) met with APS worker Blane Ohara who came to visit patient today. Per Nicki Reaper he will follow patient and offer him resources. CSW will continue to follow and assist as needed.   Blima Rich, LCSW [phone]

## 2016-03-14 NOTE — Progress Notes (Signed)
Pt has rested well throughout night.  No complaints.  This am asking for coffee.  He expresses some frustrations regarding his discharge planning.  SL. VSS.  Voiding in urinal.

## 2016-03-14 NOTE — Progress Notes (Signed)
Pt to be discharged per MD order. IV removed. Instructions reviewed with pt and all questions answered. Pt is attempting to find a ride and will be discharged once he can find transportation

## 2016-03-14 NOTE — Discharge Instructions (Signed)
°  DIET:  Regular diet and Renal diet  DISCHARGE CONDITION:  Stable  ACTIVITY:  Activity as tolerated with walker  OXYGEN:  Home Oxygen: No.   Oxygen Delivery: room air  DISCHARGE LOCATION:  home    ADDITIONAL DISCHARGE INSTRUCTION:   If you experience worsening of your admission symptoms, develop shortness of breath, life threatening emergency, suicidal or homicidal thoughts you must seek medical attention immediately by calling 911 or calling your MD immediately  if symptoms less severe.  You Must read complete instructions/literature along with all the possible adverse reactions/side effects for all the Medicines you take and that have been prescribed to you. Take any new Medicines after you have completely understood and accpet all the possible adverse reactions/side effects.   Please note  You were cared for by a hospitalist during your hospital stay. If you have any questions about your discharge medications or the care you received while you were in the hospital after you are discharged, you can call the unit and asked to speak with the hospitalist on call if the hospitalist that took care of you is not available. Once you are discharged, your primary care physician will handle any further medical issues. Please note that NO REFILLS for any discharge medications will be authorized once you are discharged, as it is imperative that you return to your primary care physician (or establish a relationship with a primary care physician if you do not have one) for your aftercare needs so that they can reassess your need for medications and monitor your lab values.

## 2016-03-14 NOTE — Care Management (Signed)
Patient unable to reach family member for transportation.  Ladona Mow contacted.  Estimated cost $15.  Patient states that he can obtain the funds from ATM.  Volunteer to take patient to ATM on lower level prior to arrival of taxi.  Ladona Mow to transport patient, to call [facility] bedside RN when arriving as hospital.

## 2016-03-14 NOTE — Discharge Summary (Signed)
Luis [name], 65 y.o., DOB [birth year], MRN [id number]. Admission date: 03/10/2016 Discharge Date 03/14/2016 Primary MD No PCP Per Patient Admitting Physician Katha Hamming, MD  Admission Diagnosis  Gastrointestinal hemorrhage, unspecified gastritis, unspecified gastrointestinal hemorrhage type [K92.2]  Discharge Diagnosis   Active Problems:   Failure to thrive (0-17)   Pressure ulcer   Acute on chronic anemia secondary to GI bleeding status post EGD noted to have nonbleeding gastric ulcer with no stigmata of bleeding  Alcohol abuse Reflux        Hospital Course Early Steel is a 65 y.o. male with a known history of Hyponatremia chronic alcohol abuse, malnutrition who was recently discharged 2 days ago comes back because social worker found him, covered with , feces and patient was lying in the same spot since 2 days. Agent was discharged on June 13 with home health physical therapy. He never got home physical therapy because he never picked up the phone. He was noted to have a hemoglobin in the ER dropped from 4.1 week ago. He was also guaiac positive. Due to this she was admitted to the hospital. Was seen by GI and had an endoscopy results are as above. He had no further bleeding hemoglobin stayed stable. Did not require transfusion. He was evaluated by physical therapy recommended home health. At this point he is being discharged with home health with physical therapy case manager and nursing at home.            Consults  GI  Significant Tests:  See full reports for all details      Ct Head Wo Contrast  03/03/2016  CLINICAL DATA:  Fall.  Initial encounter. EXAM: CT HEAD WITHOUT CONTRAST TECHNIQUE: Contiguous axial images were obtained from the base of the skull through the vertex without intravenous contrast. COMPARISON:  None. FINDINGS: Skull and Sinuses:Negative for acute fracture or hemo sinus. Small volume fluid within right mastoid air cells, likely inflammatory.  Clear nasopharynx. Minimal fluid layering within the right sphenoid sinus. Visualized orbits: No acute finding. Punctate calcification near the medial canthus on the left. Brain: No evidence of acute infarction, hemorrhage, hydrocephalus, or mass lesion/mass effect. Generalized atrophy with mild predilection for the cerebellum in this patient with alcohol abuse. 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 IMPRESSION: 1. Negative for intracranial injury or fracture. 2. Small right mastoid effusion. Electronically Signed   By: Marnee Spring M.D.   On: 03/03/2016 21:05   Dg Chest Portable 1 View  03/03/2016  CLINICAL DATA:  65 year old male new with cough and possible aspiration. EXAM: PORTABLE CHEST 1 VIEW COMPARISON:  Radiograph dated 06/18/2015 FINDINGS: The heart size and mediastinal contours are within normal limits. Both lungs are clear. The visualized skeletal structures are unremarkable. IMPRESSION: No active disease. Electronically Signed   By: Elgie Collard M.D.   On: 03/03/2016 20:14       Today   Subjective:   Luis [name] denies any complaints  Objective:   Blood pressure 130/72, pulse 92, temperature 97.9 F (36.6 C), temperature source Oral, resp. rate 17, height 6' (1.829 m), weight 65.409 kg (144 lb 3.2 oz), SpO2 97 %.  .  Intake/Output Summary (Last 24 hours) at 03/14/16 1341 Last data filed at 03/14/16 0900  Gross per 24 hour  Intake    480 ml  Output   1250 ml  Net   -770 ml    Exam VITAL SIGNS: Blood pressure 130/72, pulse 92, temperature 97.9 F (36.6 C), temperature source Oral, resp.  rate 17, height 6' (1.829 m), weight 65.409 kg (144 lb 3.2 oz), SpO2 97 %.  GENERAL:  65 y.o.-year-old patient lying in the bed with no acute distress.  EYES: Pupils equal, round, reactive to light and accommodation. No scleral icterus. Extraocular muscles intact.  HEENT: Head atraumatic, normocephalic. Oropharynx and nasopharynx clear.  NECK:  Supple, no jugular venous  distention. No thyroid enlargement, no tenderness.  LUNGS: Normal breath sounds bilaterally, no wheezing, rales,rhonchi or crepitation. No use of accessory muscles of respiration.  CARDIOVASCULAR: S1, S2 normal. No murmurs, rubs, or gallops.  ABDOMEN: Soft, nontender, nondistended. Bowel sounds present. No organomegaly or mass.  EXTREMITIES: No pedal edema, cyanosis, or clubbing.  NEUROLOGIC: Cranial nerves II through XII are intact. Muscle strength 5/5 in all extremities. Sensation intact. Gait not checked.  PSYCHIATRIC: The patient is alert and oriented x 3.  SKIN: No obvious rash, lesion, or ulcer.   Data Review     CBC w Diff: Lab Results  Component Value Date   WBC 8.1 03/11/2016   WBC 14.3* 10/28/2012   HGB 10.8* 03/12/2016   HGB 9.4* 10/28/2012   HCT 26.1* 03/11/2016   HCT 28.3* 10/28/2012   PLT 293 03/11/2016   PLT 416 10/28/2012   LYMPHOPCT 16 03/10/2016   LYMPHOPCT 12.3 10/28/2012   MONOPCT 12 03/10/2016   MONOPCT 8 10/28/2012   MONOPCT 7.2 10/28/2012   EOSPCT 3 03/10/2016   EOSPCT 0.2 10/28/2012   BASOPCT 1 03/10/2016   BASOPCT 0.4 10/28/2012   CMP: Lab Results  Component Value Date   NA 134* 03/10/2016   NA 141 10/28/2012   K 3.8 03/10/2016   K 3.9 10/28/2012   CL 102 03/10/2016   CL 107 10/28/2012   CO2 24 03/10/2016   CO2 28 10/28/2012   BUN 11 03/10/2016   BUN 22* 10/28/2012   CREATININE 0.84 03/10/2016   CREATININE 0.83 10/28/2012   PROT 5.1* 03/10/2016   PROT 6.6 10/28/2012   ALBUMIN 2.2* 03/10/2016   ALBUMIN 2.4* 10/28/2012   BILITOT 0.4 03/10/2016   BILITOT 10.1* 10/28/2012   ALKPHOS 123 03/10/2016   ALKPHOS 499* 10/28/2012   AST 45* 03/10/2016   AST 45* 10/28/2012   ALT 38 03/10/2016   ALT 53 10/28/2012  .  Micro Results No results found for this or any previous visit (from the past 240 hour(s)).      Code Status Orders        Start     Ordered   03/10/16 1641  Full code   Continuous     03/10/16 1642    Code Status  History    Date Active Date Inactive Code Status Order ID Comments User Context   03/03/2016 11:09 PM 03/08/2016  4:01 PM Full Code [id number]  Gery Pray, MD Inpatient   12/03/2015  2:39 PM 12/09/2015 10:24 PM Full Code [id number]  Katha Hamming, MD ED   06/18/2015 10:38 AM 06/20/2015  2:52 PM Full Code [id number]  Alford Highland, MD ED          Follow-up Information    Follow up with pcp In 5 days.      Discharge Medications     Medication List    TAKE these medications        esomeprazole 20 MG capsule  Commonly known as:  NEXIUM  Take 20 mg by mouth daily at 12 noon. Reported on 03/03/2016     feeding supplement (ENSURE ENLIVE) Liqd  Take 237 mLs by mouth 2 (  two) times daily between meals.     metoprolol tartrate 25 MG tablet  Commonly known as:  LOPRESSOR  Take 1 tablet (25 mg total) by mouth 2 (two) times daily.     mirtazapine 30 MG tablet  Commonly known as:  REMERON  Take 1 tablet (30 mg total) by mouth at bedtime.     nicotine 21 mg/24hr patch  Commonly known as:  NICODERM CQ - dosed in mg/24 hours  Place 1 patch (21 mg total) onto the skin daily as needed (if patient is current tobacco user and requests patch).     tamsulosin 0.4 MG Caps capsule  Commonly known as:  FLOMAX  Take 1 capsule (0.4 mg total) by mouth daily.           Total Time in preparing paper work, data evaluation and todays exam - 35 minutes  Auburn BilberryPATEL, Donte Kary M.D on 03/14/2016 at 1:41 PM  [facility]   Office  [phone]

## 2016-03-14 NOTE — Care Management (Signed)
Patient to discharge home today.  I have discussed cased with Dr. Doy Hutching who approved for [facility].  I have informed patient of these services and he is agreeable.  Referral has been made to [facility]by with [facility].  RN, PT, aide, and SW has been ordered.  APS referral was made and worker met with patient today.  See CSW note.  Patient request taxi voucher.  Patient states "my daughter likes to sleep in late and I want to get out of here".  I informed him that a taxi voucher would no be provided, and he would need to keep calling his daughter in order to arrange his own transportation.  RNCM signing off

## 2016-03-19 NOTE — Progress Notes (Signed)
   03/12/16 1600  PT Visit Information  Last PT Received On 03/12/16  Assistance Needed +1  History of Present Illness 65 yo male with FTT, recent discharge from hospital with pt having fallen upon return home and forgetting what caused it.  PMHx:  metabolic encephalopathy, hypokalemia, malnutrition, falling, major weight loss of 40# in 2 months, depression and deconditioning,EtOH abuse, failed rehab for alcohol, depression and mood changes.  Subjective Data  Patient Stated Goal To be better  Precautions  Precautions Fall  Restrictions  Weight Bearing Restrictions No  Pain Assessment  Pain Assessment Faces  Faces Pain Scale 4  Pain Location abdomen when moving  Pain Intervention(s) Monitored during session;Repositioned  Cognition  Arousal/Alertness Awake/alert  Behavior During Therapy Anxious  Overall Cognitive Status No family/caregiver present to determine baseline cognitive functioning  Memory Decreased short-term memory  Bed Mobility  Overal bed mobility Needs Assistance  Bed Mobility Supine to Sit;Sit to Supine  Supine to sit Min guard;Min assist  Sit to supine Min guard;Min assist  General bed mobility comments using railing and elevated HOB  Transfers  Overall transfer level Needs assistance  Equipment used Rolling walker (2 wheeled);1 person hand held assist  Transfers Sit to/from BJ'sStand;Stand Pivot Transfers  Sit to Stand Min guard;Min assist  Stand pivot transfers Min guard;Min assist  General transfer comment cues for safe hand placement  Ambulation/Gait  Ambulation/Gait assistance Min guard (pt informed PT he did not need her to help him)  Ambulation Distance (Feet) 170 Feet  Assistive device Rolling walker (2 wheeled)  Gait Pattern/deviations Step-through pattern;Wide base of support;Shuffle;Decreased stride length  General Gait Details cues to remain within walker  Gait velocity decreased  Gait velocity interpretation Below normal speed for age/gender  Balance   Sitting balance-Leahy Scale Good  Standing balance-Leahy Scale Poor  Exercises  Exercises Other exercises (strength in RLE 4+ to 5, LLE 4-)  PT - End of Session  Equipment Utilized During Treatment Gait belt  Activity Tolerance Patient tolerated treatment well  Patient left in bed;with call bell/phone within reach;with bed alarm set (Pt declined to sit OOB)  Nurse Communication Mobility status  PT - Assessment/Plan  PT Frequency (ACUTE ONLY) Min 2X/week  Recommendations for Other Services Rehab consult  Follow Up Recommendations Home health PT;Supervision - Intermittent  PT equipment None recommended by PT  Acute Rehab PT Goals  PT Goal Formulation With patient  Time For Goal Achievement 03/26/16  Potential to Achieve Goals Good  PT Time Calculation  PT Start Time (ACUTE ONLY) 1524  PT Stop Time (ACUTE ONLY) 1553  PT Time Calculation (min) (ACUTE ONLY) 29 min  PT G-Codes **NOT FOR INPATIENT CLASS**  Functional Assessment Tool Used clinical judgment  Functional Limitation Mobility: Walking and moving around  Mobility: Walking and Moving Around Current Status (Z6109(G8978) CJ  Mobility: Walking and Moving Around Goal Status (U0454(G8979) CI  PT General Charges  $$ ACUTE PT VISIT 1 Procedure  PT Evaluation  $PT Eval Low Complexity 1 Procedure  PT Treatments  $Gait Training 8-22 mins  Samul Dadauth Chong January, PT MS Acute Rehab Dept. Number: [facility] [id number] and [facility] [phone]

## 2016-07-14 ENCOUNTER — Encounter: Payer: Self-pay | Admitting: Emergency Medicine

## 2016-07-14 ENCOUNTER — Emergency Department
Admission: EM | Admit: 2016-07-14 | Discharge: 2016-07-14 | Disposition: A | Discharge status: Home / Self Care | Payer: Medicare HMO | Attending: Emergency Medicine | Admitting: Emergency Medicine

## 2016-07-14 ENCOUNTER — Emergency Department: Payer: Medicare HMO

## 2016-07-14 DIAGNOSIS — R0602 Shortness of breath: Secondary | ICD-10-CM | POA: Insufficient documentation

## 2016-07-14 DIAGNOSIS — R21 Rash and other nonspecific skin eruption: Secondary | ICD-10-CM | POA: Diagnosis present

## 2016-07-14 DIAGNOSIS — F17200 Nicotine dependence, unspecified, uncomplicated: Secondary | ICD-10-CM | POA: Diagnosis not present

## 2016-07-14 DIAGNOSIS — L853 Xerosis cutis: Secondary | ICD-10-CM | POA: Diagnosis not present

## 2016-07-14 DIAGNOSIS — Z79899 Other long term (current) drug therapy: Secondary | ICD-10-CM | POA: Diagnosis not present

## 2016-07-14 LAB — BASIC METABOLIC PANEL
ANION GAP: 8 (ref 5–15)
BUN: 18 mg/dL (ref 6–20)
CO2: 21 mmol/L — AB (ref 22–32)
Calcium: 8.3 mg/dL — ABNORMAL LOW (ref 8.9–10.3)
Chloride: 107 mmol/L (ref 101–111)
Creatinine, Ser: 1.09 mg/dL (ref 0.61–1.24)
GFR calc Af Amer: >60 mL/min (ref >60)
GLUCOSE: 90 mg/dL (ref 65–99)
POTASSIUM: 3.8 mmol/L (ref 3.5–5.1)
Sodium: 136 mmol/L (ref 135–145)

## 2016-07-14 LAB — CBC
HEMATOCRIT: 28.7 % — AB (ref 40.0–52.0)
HEMOGLOBIN: 9.5 g/dL — AB (ref 13.0–18.0)
MCH: 30.7 pg (ref 26.0–34.0)
MCHC: 33.3 g/dL (ref 32.0–36.0)
MCV: 92.5 fL (ref 80.0–100.0)
Platelets: 566 10*3/uL — ABNORMAL HIGH (ref 150–440)
RBC: 3.1 MIL/uL — AB (ref 4.40–5.90)
RDW: 19.2 % — ABNORMAL HIGH (ref 11.5–14.5)
WBC: 11.2 10*3/uL — ABNORMAL HIGH (ref 3.8–10.6)

## 2016-07-14 LAB — BRAIN NATRIURETIC PEPTIDE: B NATRIURETIC PEPTIDE 5: 193 pg/mL — AB (ref 0.0–100.0)

## 2016-07-14 LAB — TROPONIN I: Troponin I: <0.03 ng/mL (ref <0.03)

## 2016-07-14 MED ORDER — NAPROXEN 500 MG PO TABS: 500.0000 mg | ORAL_TABLET | Freq: Two times a day (BID) | ORAL | 2 refills | Status: DC

## 2016-07-14 MED ORDER — CARRINGTON MOISTURE BARRIER EX CREA: TOPICAL_CREAM | CUTANEOUS | 0 refills | Status: AC | PRN

## 2016-07-14 NOTE — ED Provider Notes (Signed)
[facility] Emergency Department Provider Note   ____________________________________________    I have reviewed the triage vital signs and the nursing notes.   HISTORY  Chief Complaint Rash and Leg Swelling     HPI Leanna SatoMichael Varnum is a 65 y.o. male who presents with complaints of a rash. Patient reports the development of lower extremity rash approximately 4-5 weeks ago which has steadily worsened. He complains of persistent itching. [facility] for the last 2 weeks but the rash develop prior to that. He denies fevers or chills. No erythema or evidence of cellulitis. He has never had this before. He denies shortness of breath. Otherwise he feels well.   Past Medical History:  Diagnosis Date  . Alcohol abuse   . GERD (gastroesophageal reflux disease)     Patient Active Problem List   Diagnosis Date Noted  . Pressure ulcer 03/12/2016  . Failure to thrive (0-17) 03/10/2016  . Protein-calorie malnutrition, severe 03/04/2016  . 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 Alcohol abuse 12/07/2015  . Delirium tremens (HCC) 12/07/2015  . Malnutrition of moderate degree 12/04/2015  . Hyponatremia 06/18/2015    Past Surgical History:  Procedure Laterality Date  . CHOLECYSTECTOMY    . ESOPHAGOGASTRODUODENOSCOPY N/A 03/12/2016   Procedure: ESOPHAGOGASTRODUODENOSCOPY (EGD);  Surgeon: Scot Junobert T Elliott, MD;  [facility];  Service: Endoscopy;  Laterality: N/A;    Prior to Admission medications   Medication Sig Start Date End Date Taking? Authorizing Provider  esomeprazole (NEXIUM) 20 MG capsule Take 20 mg by mouth daily at 12 noon. Reported on 03/03/2016    Historical Provider, MD  feeding supplement, ENSURE ENLIVE, (ENSURE ENLIVE) LIQD Take 237 mLs by mouth 2 (two) times daily between meals. 03/07/16   Adrian SaranSital Mody, MD  metoprolol tartrate (LOPRESSOR) 25 MG tablet Take 1 tablet (25  mg total) by mouth 2 (two) times daily. 12/09/15   Enid Baasadhika Kalisetti, MD  mirtazapine (REMERON) 30 MG tablet Take 1 tablet (30 mg total) by mouth at bedtime. 03/07/16   Adrian SaranSital Mody, MD  nicotine (NICODERM CQ - DOSED IN MG/24 HOURS) 21 mg/24hr patch Place 1 patch (21 mg total) onto the skin daily as needed (if patient is current tobacco user and requests patch). 03/07/16   Adrian SaranSital Mody, MD  tamsulosin (FLOMAX) 0.4 MG CAPS capsule Take 1 capsule (0.4 mg total) by mouth daily. 12/09/15   Enid Baasadhika Kalisetti, MD     Allergies Review of patient's allergies indicates no known allergies.  Family History  Problem Relation Age of Onset  . Alcohol abuse Mother   . Alcohol abuse Father     Social History Social History  Substance Use Topics  . Smoking status: Current Every Day Smoker    Packs/day: 0.50  . Smokeless tobacco: Never Used  . Alcohol use No     Comment: stopped etoh 2 weeks ago    Review of Systems  Constitutional: No fever/chills  Respiratory: No shortness of breath  Gastrointestinal: No abdominal pain.  No nausea, no vomiting.    Musculoskeletal: Negative for Joint pain Skin: As above     ____________________________________________   PHYSICAL EXAM:  VITAL SIGNS: ED Triage Vitals  Enc Vitals Group     BP 07/14/16 0901 (!) 157/88     Pulse Rate 07/14/16 0900 100     Resp 07/14/16 0900 18     Temp --      Temp src --  SpO2 07/14/16 0900 99 %     Weight 07/14/16 0901 156 lb (70.8 kg)     Height 07/14/16 0901 6' (1.829 m)     Head Circumference --      Peak Flow --      Pain Score 07/14/16 0902 8     Pain Loc --      Pain Edu? --      Excl. in GC? --     Constitutional: Alert and oriented. No acute distress. Pleasant and interactive Eyes: Conjunctivae are normal.   Nose: No congestion/rhinnorhea. Mouth/Throat: Mucous membranes are moist.   Cardiovascular: Normal rate, regular rhythm.  Respiratory: Normal respiratory effort.  No  retractions. Genitourinary: deferred Musculoskeletal:Minimal swelling in the lower extremities bilaterally, no significant edema. 2+ pulses distally, extremities are warm and well perfused Neurologic:  Normal speech and language. No gross focal neurologic deficits are appreciated.   Skin:  Skin is warm, dry. Dry scaly rash to the bilateral lower extremities extending up to the groin. No erythema or evidence of cellulitis. Not consistent with vasculitis. Does not appear consistent with scabies.   ____________________________________________   LABS (all labs ordered are listed, but only abnormal results are displayed)  Labs Reviewed  BASIC METABOLIC PANEL - Abnormal; Notable for the following:       Result Value   CO2 21 (*)    Calcium 8.3 (*)    All other components within normal limits  CBC - Abnormal; Notable for the following:    WBC 11.2 (*)    RBC 3.10 (*)    Hemoglobin 9.5 (*)    HCT 28.7 (*)    RDW 19.2 (*)    Platelets 566 (*)    All other components within normal limits  BRAIN NATRIURETIC PEPTIDE - Abnormal; Notable for the following:    B Natriuretic Peptide 193.0 (*)    All other components within normal limits  TROPONIN I   ____________________________________________  EKG  ED ECG REPORT I, Jene Every, the attending physician, personally viewed and interpreted this ECG.  Date: 07/14/2016 EKG Time: 9:27 AM Rate: 93 Rhythm: normal sinus rhythm QRS Axis: normal Intervals: normal ST/T Wave abnormalities: normal Conduction Disturbances: none Narrative Interpretation: unremarkable  ____________________________________________  RADIOLOGY  Chest x-ray unremarkable ____________________________________________   PROCEDURES  Procedure(s) performed: No    Critical Care performed: No ____________________________________________   INITIAL IMPRESSION / ASSESSMENT AND PLAN / ED COURSE  Pertinent labs & imaging results that were available during my  care of the patient were reviewed by me and considered in my medical decision making (see chart for details).  Patient well-appearing and in no distress. Dry scaly rash to the lower extremities appears to be primarily consistent with severely dry skin. I'll recommend Eucerin or Aquaphor ointments and follow-up with PCP versus dermatologist   ____________________________________________   FINAL CLINICAL IMPRESSION(S) / ED DIAGNOSES  Final diagnoses:  Rash  Dry skin dermatitis      NEW MEDICATIONS STARTED DURING THIS VISIT:  New Prescriptions   No medications on file     Note:  This document was prepared using Dragon voice recognition software and may include unintentional dictation errors.    Jene Every, MD 07/14/16 1021

## 2016-07-14 NOTE — ED Triage Notes (Signed)
[facility]. Has rash on arms and legs with itching

## 2016-07-14 NOTE — ED Notes (Signed)
Patient transported to X-ray 

## 2016-07-18 ENCOUNTER — Emergency Department
Admission: EM | Admit: 2016-07-18 | Discharge: 2016-07-18 | Disposition: A | Discharge status: Home / Self Care | Payer: Medicare HMO | Attending: Emergency Medicine | Admitting: Emergency Medicine

## 2016-07-18 ENCOUNTER — Encounter: Payer: Self-pay | Admitting: Emergency Medicine

## 2016-07-18 DIAGNOSIS — Z79899 Other long term (current) drug therapy: Secondary | ICD-10-CM | POA: Diagnosis not present

## 2016-07-18 DIAGNOSIS — F17210 Nicotine dependence, cigarettes, uncomplicated: Secondary | ICD-10-CM | POA: Diagnosis not present

## 2016-07-18 DIAGNOSIS — R42 Dizziness and giddiness: Secondary | ICD-10-CM | POA: Insufficient documentation

## 2016-07-18 DIAGNOSIS — R4182 Altered mental status, unspecified: Secondary | ICD-10-CM | POA: Diagnosis present

## 2016-07-18 LAB — COMPREHENSIVE METABOLIC PANEL
ALK PHOS: 92 U/L (ref 38–126)
ALT: 22 U/L (ref 17–63)
AST: 45 U/L — AB (ref 15–41)
Albumin: 2.7 g/dL — ABNORMAL LOW (ref 3.5–5.0)
Anion gap: 7 (ref 5–15)
BUN: 15 mg/dL (ref 6–20)
CHLORIDE: 110 mmol/L (ref 101–111)
CO2: 23 mmol/L (ref 22–32)
CREATININE: 0.98 mg/dL (ref 0.61–1.24)
Calcium: 8.4 mg/dL — ABNORMAL LOW (ref 8.9–10.3)
GFR calc Af Amer: >60 mL/min (ref >60)
Glucose, Bld: 101 mg/dL — ABNORMAL HIGH (ref 65–99)
Potassium: 4.3 mmol/L (ref 3.5–5.1)
SODIUM: 140 mmol/L (ref 135–145)
Total Bilirubin: 0.5 mg/dL (ref 0.3–1.2)
Total Protein: 6.1 g/dL — ABNORMAL LOW (ref 6.5–8.1)

## 2016-07-18 LAB — CBC
HCT: 27.8 % — ABNORMAL LOW (ref 40.0–52.0)
Hemoglobin: 9.4 g/dL — ABNORMAL LOW (ref 13.0–18.0)
MCH: 31 pg (ref 26.0–34.0)
MCHC: 33.7 g/dL (ref 32.0–36.0)
MCV: 91.9 fL (ref 80.0–100.0)
PLATELETS: 522 10*3/uL — AB (ref 150–440)
RBC: 3.03 MIL/uL — ABNORMAL LOW (ref 4.40–5.90)
RDW: 18.4 % — AB (ref 11.5–14.5)
WBC: 9.1 10*3/uL (ref 3.8–10.6)

## 2016-07-18 MED ORDER — SODIUM CHLORIDE 0.9 % IV SOLN
1000.0000 mL | Freq: Once | INTRAVENOUS | Status: AC
  Administered 2016-07-18: 1000 mL via INTRAVENOUS

## 2016-07-18 NOTE — ED Provider Notes (Signed)
[facility] Emergency Department Provider Note   ____________________________________________    I have reviewed the triage vital signs and the nursing notes.   HISTORY  Chief Complaint Altered Mental Status     HPI Luis [name] is a 65 y.o. male who presents with complaints of dizziness. He notes that when he stands up he has to pause because he reports he usually feels dizzy. He denies chest pain or palpitations. He reports he also fell several days ago and he is worried he may have a concussion. He states he needs to come into the hospital to 'rest up'. Of note I saw this patient several days ago for unrelated complaint   Past Medical History:  Diagnosis Date  . Alcohol abuse   . GERD (gastroesophageal reflux disease)     Patient Active Problem List   Diagnosis Date Noted  . Pressure ulcer 03/12/2016  . Failure to thrive (0-17) 03/10/2016  . Protein-calorie malnutrition, severe 03/04/2016  . 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 Alcohol abuse 12/07/2015  . Delirium tremens (HCC) 12/07/2015  . Malnutrition of moderate degree 12/04/2015  . Hyponatremia 06/18/2015    Past Surgical History:  Procedure Laterality Date  . CHOLECYSTECTOMY    . ESOPHAGOGASTRODUODENOSCOPY N/A 03/12/2016   Procedure: ESOPHAGOGASTRODUODENOSCOPY (EGD);  Surgeon: Scot Jun, MD;  Location: [facility] ENDOSCOPY;  Service: Endoscopy;  Laterality: N/A;    Prior to Admission medications   Medication Sig Start Date End Date Taking? Authorizing Provider  esomeprazole (NEXIUM) 20 MG capsule Take 20 mg by mouth daily at 12 noon. Reported on 03/03/2016    Historical Provider, MD  feeding supplement, ENSURE ENLIVE, (ENSURE ENLIVE) LIQD Take 237 mLs by mouth 2 (two) times daily between meals. 03/07/16   Adrian Saran, MD  metoprolol tartrate (LOPRESSOR) 25 MG tablet Take 1 tablet (25 mg total) by mouth 2 (two) times daily. 12/09/15    Enid Baas, MD  mirtazapine (REMERON) 30 MG tablet Take 1 tablet (30 mg total) by mouth at bedtime. 03/07/16   Adrian Saran, MD  naproxen (NAPROSYN) 500 MG tablet Take 1 tablet (500 mg total) by mouth 2 (two) times daily with a meal. 07/14/16   Jene Every, MD  nicotine (NICODERM CQ - DOSED IN MG/24 HOURS) 21 mg/24hr patch Place 1 patch (21 mg total) onto the skin daily as needed (if patient is current tobacco user and requests patch). 03/07/16   Adrian Saran, MD  Skin Protectants, Misc. (EUCERIN) cream Apply topically as needed for wound care. 07/14/16   Jene Every, MD  tamsulosin (FLOMAX) 0.4 MG CAPS capsule Take 1 capsule (0.4 mg total) by mouth daily. 12/09/15   Enid Baas, MD     Allergies Review of patient's allergies indicates no known allergies.  Family History  Problem Relation Age of Onset  . Alcohol abuse Mother   . Alcohol abuse Father     Social History Social History  Substance Use Topics  . Smoking status: Current Every Day Smoker    Packs/day: 0.50    Types: Cigarettes  . Smokeless tobacco: Never Used  . Alcohol use No     Comment: stopped etoh 2 weeks ago    Review of Systems  Constitutional: No fever/chills Eyes: No visual changes.  ENT: No neck pain Cardiovascular: Denies chest pain. Respiratory: Denies shortness of breath. Gastrointestinal: No abdominal pain.  No nausea, no vomiting.    Musculoskeletal: Negative for back pain. Skin: Negative for rash. Neurological: Negative  for headaches or weakness  10-point ROS otherwise negative.  ____________________________________________   PHYSICAL EXAM:  VITAL SIGNS: ED Triage Vitals  Enc Vitals Group     BP 07/18/16 1419 (!) 147/65     Pulse Rate 07/18/16 1419 (!) 105     Resp 07/18/16 1419 14     Temp 07/18/16 1419 97.6 F (36.4 C)     Temp Source 07/18/16 1419 Oral     SpO2 07/18/16 1419 100 %     Weight 07/18/16 1419 150 lb (68 kg)     Height 07/18/16 1419 6' (1.829 m)     Head  Circumference --      Peak Flow --      Pain Score 07/18/16 1432 7     Pain Loc --      Pain Edu? --      Excl. in GC? --     Constitutional: Alert and oriented. No acute distress. Pleasant and interactive Eyes: Conjunctivae are normal.   Nose: No congestion/rhinnorhea. Mouth/Throat: Mucous membranes are moist.    Cardiovascular: Normal rate, regular rhythm. Grossly normal heart sounds.  Good peripheral circulation. Respiratory: Normal respiratory effort.  No retractions. Lungs CTAB. Gastrointestinal: Soft and nontender. No distention.  No CVA tenderness. Genitourinary: deferred Musculoskeletal: No lower extremity tenderness nor edema.  Warm and well perfused Neurologic:  Normal speech and language. No gross focal neurologic deficits are appreciated. CN 2-12 intact, EOMI, PERRLA Skin:  Skin is warm, dry and intact. Rash to upper and lower extremities, unchanged from last visit, most consistent with severely dry skin no cellulitis or infection. Psychiatric: Mood and affect are normal. Speech and behavior are normal.  ____________________________________________   LABS (all labs ordered are listed, but only abnormal results are displayed)  Labs Reviewed  COMPREHENSIVE METABOLIC PANEL - Abnormal; Notable for the following:       Result Value   Glucose, Bld 101 (*)    Calcium 8.4 (*)    Total Protein 6.1 (*)    Albumin 2.7 (*)    AST 45 (*)    All other components within normal limits  CBC - Abnormal; Notable for the following:    RBC 3.03 (*)    Hemoglobin 9.4 (*)    HCT 27.8 (*)    RDW 18.4 (*)    Platelets 522 (*)    All other components within normal limits  CBG MONITORING, ED   ____________________________________________  EKG  ED ECG REPORT I, Jene EveryKINNER, Marycatherine Maniscalco, the attending physician, personally viewed and interpreted this ECG.  Date: 07/18/2016  Rate: 84 Rhythm: normal sinus rhythm QRS Axis: normal Intervals: normal ST/T Wave abnormalities:  normal Conduction Disturbances: none Narrative Interpretation: unremarkable  ____________________________________________  RADIOLOGY  None ____________________________________________   PROCEDURES  Procedure(s) performed: No    Critical Care performed: No ____________________________________________   INITIAL IMPRESSION / ASSESSMENT AND PLAN / ED COURSE  Pertinent labs & imaging results that were available during my care of the patient were reviewed by me and considered in my medical decision making (see chart for details).  Patient presents with chronic dizziness.This does not appear to be related to etoh withdrawal. Overall he is well-appearing and in no distress. I suspect he is primarily here because he is staying in a shelter. We'll check EKG, labs and give 1 L of normal saline and reevaluate.  No evidence of significant head trauma. No blood thinners.  Clinical Course  Patient's heart rate improved with fluids, he remains well-appearing, benign exam. Lab work  unremarkable, EKG normal. Outpatient follow-up is appropriate. ____________________________________________   FINAL CLINICAL IMPRESSION(S) / ED DIAGNOSES  Final diagnoses:  Dizziness      NEW MEDICATIONS STARTED DURING THIS VISIT:  New Prescriptions   No medications on file     Note:  This document was prepared using Dragon voice recognition software and may include unintentional dictation errors.    Jene Every, MD 07/18/16 1910

## 2016-07-18 NOTE — ED Triage Notes (Signed)
Pt to ED today with symptoms of altered mental status, such as intermittent confusion, stuttering, difficulty reading and poor motor skills compared to baseline. Pt states he fell and hit head approximately 1 year ago and these symptoms having been occurring since then.

## 2016-07-31 ENCOUNTER — Encounter: Payer: Self-pay | Admitting: Emergency Medicine

## 2016-07-31 ENCOUNTER — Emergency Department: Payer: Medicare HMO

## 2016-07-31 ENCOUNTER — Inpatient Hospital Stay
Admission: EM | Admit: 2016-07-31 | Discharge: 2016-08-05 | DRG: 292 | Disposition: A | Discharge status: Home / Self Care | Payer: Medicare HMO | Attending: Internal Medicine | Admitting: Internal Medicine

## 2016-07-31 DIAGNOSIS — J440 Chronic obstructive pulmonary disease with acute lower respiratory infection: Secondary | ICD-10-CM | POA: Diagnosis present

## 2016-07-31 DIAGNOSIS — K219 Gastro-esophageal reflux disease without esophagitis: Secondary | ICD-10-CM | POA: Diagnosis present

## 2016-07-31 DIAGNOSIS — I509 Heart failure, unspecified: Secondary | ICD-10-CM

## 2016-07-31 DIAGNOSIS — Z9049 Acquired absence of other specified parts of digestive tract: Secondary | ICD-10-CM

## 2016-07-31 DIAGNOSIS — I5031 Acute diastolic (congestive) heart failure: Secondary | ICD-10-CM | POA: Diagnosis present

## 2016-07-31 DIAGNOSIS — R29898 Other symptoms and signs involving the musculoskeletal system: Secondary | ICD-10-CM

## 2016-07-31 DIAGNOSIS — I110 Hypertensive heart disease with heart failure: Secondary | ICD-10-CM | POA: Diagnosis present

## 2016-07-31 DIAGNOSIS — F1010 Alcohol abuse, uncomplicated: Secondary | ICD-10-CM | POA: Diagnosis present

## 2016-07-31 DIAGNOSIS — K2980 Duodenitis without bleeding: Secondary | ICD-10-CM | POA: Diagnosis present

## 2016-07-31 DIAGNOSIS — Z9181 History of falling: Secondary | ICD-10-CM

## 2016-07-31 DIAGNOSIS — N400 Enlarged prostate without lower urinary tract symptoms: Secondary | ICD-10-CM | POA: Diagnosis present

## 2016-07-31 DIAGNOSIS — H9190 Unspecified hearing loss, unspecified ear: Secondary | ICD-10-CM | POA: Diagnosis present

## 2016-07-31 DIAGNOSIS — D509 Iron deficiency anemia, unspecified: Secondary | ICD-10-CM | POA: Diagnosis present

## 2016-07-31 DIAGNOSIS — J209 Acute bronchitis, unspecified: Secondary | ICD-10-CM | POA: Diagnosis present

## 2016-07-31 DIAGNOSIS — I5021 Acute systolic (congestive) heart failure: Secondary | ICD-10-CM | POA: Diagnosis present

## 2016-07-31 DIAGNOSIS — F17210 Nicotine dependence, cigarettes, uncomplicated: Secondary | ICD-10-CM | POA: Diagnosis present

## 2016-07-31 DIAGNOSIS — E5112 Wet beriberi: Secondary | ICD-10-CM

## 2016-07-31 DIAGNOSIS — F332 Major depressive disorder, recurrent severe without psychotic features: Secondary | ICD-10-CM | POA: Diagnosis present

## 2016-07-31 DIAGNOSIS — K269 Duodenal ulcer, unspecified as acute or chronic, without hemorrhage or perforation: Secondary | ICD-10-CM | POA: Diagnosis present

## 2016-07-31 DIAGNOSIS — I480 Paroxysmal atrial fibrillation: Secondary | ICD-10-CM | POA: Diagnosis present

## 2016-07-31 DIAGNOSIS — E5111 Dry beriberi: Secondary | ICD-10-CM | POA: Diagnosis present

## 2016-07-31 DIAGNOSIS — I426 Alcoholic cardiomyopathy: Secondary | ICD-10-CM | POA: Diagnosis present

## 2016-07-31 DIAGNOSIS — Z79899 Other long term (current) drug therapy: Secondary | ICD-10-CM

## 2016-07-31 DIAGNOSIS — R21 Rash and other nonspecific skin eruption: Secondary | ICD-10-CM | POA: Diagnosis present

## 2016-07-31 DIAGNOSIS — Z811 Family history of alcohol abuse and dependence: Secondary | ICD-10-CM

## 2016-07-31 DIAGNOSIS — R0602 Shortness of breath: Secondary | ICD-10-CM | POA: Diagnosis present

## 2016-07-31 DIAGNOSIS — I4891 Unspecified atrial fibrillation: Secondary | ICD-10-CM | POA: Diagnosis present

## 2016-07-31 DIAGNOSIS — M6281 Muscle weakness (generalized): Secondary | ICD-10-CM

## 2016-07-31 DIAGNOSIS — W19XXXA Unspecified fall, initial encounter: Secondary | ICD-10-CM

## 2016-07-31 DIAGNOSIS — R262 Difficulty in walking, not elsewhere classified: Secondary | ICD-10-CM

## 2016-07-31 HISTORY — DX: Benign prostatic hyperplasia without lower urinary tract symptoms: N40.0

## 2016-07-31 LAB — CBC WITH DIFFERENTIAL/PLATELET
BASOS ABS: 0.1 10*3/uL (ref 0–0.1)
Basophils Relative: 1 %
Eosinophils Absolute: 0.6 10*3/uL (ref 0–0.7)
Eosinophils Relative: 6 %
HEMATOCRIT: 26.7 % — AB (ref 40.0–52.0)
HEMOGLOBIN: 9 g/dL — AB (ref 13.0–18.0)
LYMPHS PCT: 14 %
Lymphs Abs: 1.4 10*3/uL (ref 1.0–3.6)
MCH: 30.5 pg (ref 26.0–34.0)
MCHC: 33.6 g/dL (ref 32.0–36.0)
MCV: 90.8 fL (ref 80.0–100.0)
MONO ABS: 1.2 10*3/uL — AB (ref 0.2–1.0)
MONOS PCT: 12 %
NEUTROS ABS: 6.9 10*3/uL — AB (ref 1.4–6.5)
Neutrophils Relative %: 67 %
Platelets: 478 10*3/uL — ABNORMAL HIGH (ref 150–440)
RBC: 2.94 MIL/uL — ABNORMAL LOW (ref 4.40–5.90)
RDW: 17.8 % — AB (ref 11.5–14.5)
WBC: 10.1 10*3/uL (ref 3.8–10.6)

## 2016-07-31 LAB — COMPREHENSIVE METABOLIC PANEL
ALK PHOS: 124 U/L (ref 38–126)
ALT: 22 U/L (ref 17–63)
AST: 40 U/L (ref 15–41)
Albumin: 3 g/dL — ABNORMAL LOW (ref 3.5–5.0)
Anion gap: 7 (ref 5–15)
BUN: 17 mg/dL (ref 6–20)
CALCIUM: 8.4 mg/dL — AB (ref 8.9–10.3)
CO2: 24 mmol/L (ref 22–32)
CREATININE: 0.97 mg/dL (ref 0.61–1.24)
Chloride: 110 mmol/L (ref 101–111)
GFR calc Af Amer: >60 mL/min (ref >60)
Glucose, Bld: 97 mg/dL (ref 65–99)
POTASSIUM: 4 mmol/L (ref 3.5–5.1)
Sodium: 141 mmol/L (ref 135–145)
TOTAL PROTEIN: 6.7 g/dL (ref 6.5–8.1)

## 2016-07-31 LAB — BRAIN NATRIURETIC PEPTIDE: B Natriuretic Peptide: 1493 pg/mL — ABNORMAL HIGH (ref 0.0–100.0)

## 2016-07-31 LAB — FOLATE: FOLATE: 11.7 ng/mL (ref >5.9)

## 2016-07-31 LAB — AMMONIA: Ammonia: 29 umol/L (ref 9–35)

## 2016-07-31 LAB — IRON AND TIBC
Iron: 18 ug/dL — ABNORMAL LOW (ref 45–182)
SATURATION RATIOS: 5 % — AB (ref 17.9–39.5)
TIBC: 368 ug/dL (ref 250–450)
UIBC: 350 ug/dL

## 2016-07-31 LAB — TROPONIN I

## 2016-07-31 LAB — FERRITIN: FERRITIN: 23 ng/mL — AB (ref 24–336)

## 2016-07-31 MED ORDER — SODIUM CHLORIDE 0.9% FLUSH
3.0000 mL | Freq: Two times a day (BID) | INTRAVENOUS | Status: DC
  Administered 2016-07-31 – 2016-08-05 (×10): 3 mL via INTRAVENOUS

## 2016-07-31 MED ORDER — TAMSULOSIN HCL 0.4 MG PO CAPS
0.4000 mg | ORAL_CAPSULE | Freq: Every day | ORAL | Status: DC
  Administered 2016-08-01 – 2016-08-05 (×5): 0.4 mg via ORAL
  Filled 2016-07-31 (×5): qty 1

## 2016-07-31 MED ORDER — NICOTINE 21 MG/24HR TD PT24: 21.0000 mg | MEDICATED_PATCH | Freq: Every day | TRANSDERMAL | Status: DC | PRN

## 2016-07-31 MED ORDER — PREDNISONE 20 MG PO TABS
60.0000 mg | ORAL_TABLET | Freq: Once | ORAL | Status: AC
  Administered 2016-07-31: 60 mg via ORAL
  Filled 2016-07-31: qty 3

## 2016-07-31 MED ORDER — ENSURE ENLIVE PO LIQD
237.0000 mL | Freq: Two times a day (BID) | ORAL | Status: DC
  Administered 2016-08-01 – 2016-08-04 (×8): 237 mL via ORAL

## 2016-07-31 MED ORDER — ACETAMINOPHEN 650 MG RE SUPP: 650.0000 mg | Freq: Four times a day (QID) | RECTAL | Status: DC | PRN

## 2016-07-31 MED ORDER — FUROSEMIDE 10 MG/ML IJ SOLN
40.0000 mg | Freq: Once | INTRAMUSCULAR | Status: AC
  Administered 2016-07-31: 40 mg via INTRAVENOUS
  Filled 2016-07-31: qty 4

## 2016-07-31 MED ORDER — SODIUM CHLORIDE 0.9% FLUSH
3.0000 mL | INTRAVENOUS | Status: DC | PRN
  Administered 2016-08-03: 3 mL via INTRAVENOUS
  Filled 2016-07-31: qty 3

## 2016-07-31 MED ORDER — SODIUM CHLORIDE 0.9 % IV SOLN: 250.0000 mL | INTRAVENOUS | Status: DC | PRN

## 2016-07-31 MED ORDER — PREDNISONE 20 MG PO TABS
20.0000 mg | ORAL_TABLET | Freq: Every day | ORAL | Status: AC
  Administered 2016-08-01 – 2016-08-03 (×3): 20 mg via ORAL
  Filled 2016-07-31 (×3): qty 1

## 2016-07-31 MED ORDER — ENOXAPARIN SODIUM 40 MG/0.4ML ~~LOC~~ SOLN: 40.0000 mg | SUBCUTANEOUS | Status: DC

## 2016-07-31 MED ORDER — HYDROCERIN EX CREA
TOPICAL_CREAM | CUTANEOUS | Status: DC | PRN
  Filled 2016-07-31: qty 113

## 2016-07-31 MED ORDER — ACETAMINOPHEN 325 MG PO TABS
650.0000 mg | ORAL_TABLET | Freq: Four times a day (QID) | ORAL | Status: DC | PRN
  Administered 2016-07-31 – 2016-08-05 (×7): 650 mg via ORAL
  Filled 2016-07-31 (×7): qty 2

## 2016-07-31 MED ORDER — ONDANSETRON HCL 4 MG/2ML IJ SOLN: 4.0000 mg | Freq: Four times a day (QID) | INTRAMUSCULAR | Status: DC | PRN

## 2016-07-31 MED ORDER — FUROSEMIDE 10 MG/ML IJ SOLN
40.0000 mg | Freq: Two times a day (BID) | INTRAMUSCULAR | Status: DC
  Administered 2016-08-01 – 2016-08-02 (×3): 40 mg via INTRAVENOUS
  Filled 2016-07-31 (×3): qty 4

## 2016-07-31 MED ORDER — DIPHENHYDRAMINE HCL 25 MG PO CAPS: 25.0000 mg | ORAL_CAPSULE | Freq: Four times a day (QID) | ORAL | Status: DC | PRN

## 2016-07-31 MED ORDER — DIPHENHYDRAMINE HCL 25 MG PO CAPS
25.0000 mg | ORAL_CAPSULE | Freq: Four times a day (QID) | ORAL | Status: DC | PRN
  Administered 2016-08-02 – 2016-08-05 (×8): 25 mg via ORAL
  Filled 2016-07-31 (×8): qty 1

## 2016-07-31 MED ORDER — NAPROXEN 500 MG PO TABS
500.0000 mg | ORAL_TABLET | Freq: Two times a day (BID) | ORAL | Status: DC
  Administered 2016-08-01: 500 mg via ORAL
  Filled 2016-07-31: qty 1

## 2016-07-31 MED ORDER — MIRTAZAPINE 15 MG PO TABS
30.0000 mg | ORAL_TABLET | Freq: Every day | ORAL | Status: DC
Start: 2016-07-31 — End: 2016-08-05
  Administered 2016-07-31 – 2016-08-04 (×5): 30 mg via ORAL
  Filled 2016-07-31 (×6): qty 2

## 2016-07-31 MED ORDER — ONDANSETRON HCL 4 MG PO TABS: 4.0000 mg | ORAL_TABLET | Freq: Four times a day (QID) | ORAL | Status: DC | PRN

## 2016-07-31 MED ORDER — IPRATROPIUM-ALBUTEROL 0.5-2.5 (3) MG/3ML IN SOLN
3.0000 mL | Freq: Once | RESPIRATORY_TRACT | Status: AC
  Administered 2016-07-31: 3 mL via RESPIRATORY_TRACT
  Filled 2016-07-31: qty 3

## 2016-07-31 MED ORDER — PANTOPRAZOLE SODIUM 40 MG PO TBEC
40.0000 mg | DELAYED_RELEASE_TABLET | Freq: Every day | ORAL | Status: DC
  Administered 2016-08-01 – 2016-08-05 (×5): 40 mg via ORAL
  Filled 2016-07-31 (×5): qty 1

## 2016-07-31 NOTE — H&P (Signed)
[facility] [place] at [facility]   PATIENT NAME: Luis SatoMichael [name]    MR#:  [id number]  DATE OF BIRTH:  11/03/1950  DATE OF ADMISSION:  07/31/2016  PRIMARY CARE PHYSICIAN: No PCP Per Patient   REQUESTING/REFERRING PHYSICIAN: Hinton LovelyBrian Quigely MD  CHIEF COMPLAINT:   Chief Complaint  Patient presents with  . Foot Swelling  . Rash    HISTORY OF PRESENT ILLNESS: Luis SatoMichael [name]  is a 65 y.o. male with a known history of  Alcohol abuse, GERD who has not drank in more than a month. Who presents to the ED with complaint of lower extremity swelling and shortness of breath. Patient reports that he was seen in the emergency room on October 23 at that time he was diagnosed with dehydration and was given aggressive IV fluids. Then he was discharged. After that episode he started noticing swelling of his lower extremity and progressive shortness of breath. Patient reports  no chest pain or palpitations. He does describe nocturnal dyspnea. Patient also complains of Hg rash in his arms and legs. PAST MEDICAL HISTORY:   Past Medical History:  Diagnosis Date  . Alcohol abuse   . GERD (gastroesophageal reflux disease)     PAST SURGICAL HISTORY: Past Surgical History:  Procedure Laterality Date  . CHOLECYSTECTOMY    . ESOPHAGOGASTRODUODENOSCOPY N/A 03/12/2016   Procedure: ESOPHAGOGASTRODUODENOSCOPY (EGD);  Surgeon: Scot Junobert T Elliott, MD;  [facility];  Service: Endoscopy;  Laterality: N/A;    SOCIAL HISTORY:  Social History  Substance Use Topics  . Smoking status: Current Every Day Smoker    Packs/day: 0.50    Types: Cigarettes  . Smokeless tobacco: Never Used  . Alcohol use No     Comment: stopped etoh 2 weeks ago    FAMILY HISTORY:  Family History  Problem Relation Age of Onset  . Alcohol abuse Mother   . Alcohol abuse Father     DRUG ALLERGIES: No Known Allergies  REVIEW OF SYSTEMS:   CONSTITUTIONAL: No fever, fatigue or weakness.  EYES: No  blurred or double vision.  EARS, NOSE, AND THROAT: No tinnitus or ear pain.  RESPIRATORY: No cough, Positive shortness of breath, no wheezing or hemoptysis.  CARDIOVASCULAR: No chest pain, orthopnea, edema.  GASTROINTESTINAL: No nausea, vomiting, diarrhea or abdominal pain.  GENITOURINARY: No dysuria, hematuria.  ENDOCRINE: No polyuria, nocturia,  HEMATOLOGY: No anemia, easy bruising or bleeding SKIN: No rash or lesion. Positive for edema MUSCULOSKELETAL: No joint pain or arthritis.   NEUROLOGIC: No tingling, numbness, weakness.  PSYCHIATRY: No anxiety or depression.   MEDICATIONS AT HOME:  Prior to Admission medications   Medication Sig Start Date End Date Taking? Authorizing Provider  esomeprazole (NEXIUM) 20 MG capsule Take 20 mg by mouth daily at 12 noon. Reported on 03/03/2016   Yes Historical Provider, MD  feeding supplement, ENSURE ENLIVE, (ENSURE ENLIVE) LIQD Take 237 mLs by mouth 2 (two) times daily between meals. 03/07/16  Yes Adrian SaranSital Mody, MD  Skin Protectants, Misc. (EUCERIN) cream Apply topically as needed for wound care. 07/14/16  Yes Jene Everyobert Kinner, MD  mirtazapine (REMERON) 30 MG tablet Take 1 tablet (30 mg total) by mouth at bedtime. Patient not taking: Reported on 07/31/2016 03/07/16   Adrian SaranSital Mody, MD  naproxen (NAPROSYN) 500 MG tablet Take 1 tablet (500 mg total) by mouth 2 (two) times daily with a meal. Patient not taking: Reported on 07/31/2016 07/14/16   Jene Everyobert Kinner, MD  nicotine (NICODERM CQ - DOSED IN MG/24 HOURS) 21  mg/24hr patch Place 1 patch (21 mg total) onto the skin daily as needed (if patient is current tobacco user and requests patch). Patient not taking: Reported on 07/31/2016 03/07/16   Adrian Saran, MD  tamsulosin (FLOMAX) 0.4 MG CAPS capsule Take 1 capsule (0.4 mg total) by mouth daily. Patient not taking: Reported on 07/31/2016 12/09/15   Enid Baas, MD      PHYSICAL EXAMINATION:   VITAL SIGNS: Blood pressure (!) 167/94, pulse (!) 104, temperature 97.5 F  (36.4 C), temperature source Oral, resp. rate 16, height 6' (1.829 m), weight 155 lb (70.3 kg), SpO2 100 %.  GENERAL:  65 y.o.-year-old patient lying in the bed with no acute distress.  EYES: Pupils equal, round, reactive to light and accommodation. No scleral icterus. Extraocular muscles intact.  HEENT: Head atraumatic, normocephalic. Oropharynx and nasopharynx clear.  NECK:  Supple, no jugular venous distention. No thyroid enlargement, no tenderness.  LUNGS: Bilateral crackles throughout both lungs there is no wheezing or rhonchi CARDIOVASCULAR: S1, S2 normal. No murmurs, rubs, or gallops.  ABDOMEN: Soft, nontender, nondistended. Bowel sounds present. No organomegaly or mass.  EXTREMITIES: 2+ pitting edema, cyanosis, or clubbing.  NEUROLOGIC: Cranial nerves II through XII are intact. Muscle strength 5/5 in all extremities. Sensation intact. Gait not checked.  PSYCHIATRIC: The patient is alert and oriented x 3.  SKIN: Excoriated in rash on his arm and legs, lesion, or ulcer.   LABORATORY PANEL:   CBC  Recent Labs Lab 07/31/16 1733  WBC 10.1  HGB 9.0*  HCT 26.7*  PLT 478*  MCV 90.8  MCH 30.5  MCHC 33.6  RDW 17.8*  LYMPHSABS 1.4  MONOABS 1.2*  EOSABS 0.6  BASOSABS 0.1   ------------------------------------------------------------------------------------------------------------------  Chemistries   Recent Labs Lab 07/31/16 1733  NA 141  K 4.0  CL 110  CO2 24  GLUCOSE 97  BUN 17  CREATININE 0.97  CALCIUM 8.4*  AST 40  ALT 22  ALKPHOS 124  BILITOT <0.1*   ------------------------------------------------------------------------------------------------------------------ estimated creatinine clearance is 74.5 mL/min (by C-G formula based on SCr of 0.97 mg/dL). ------------------------------------------------------------------------------------------------------------------ No results for input(s): TSH, T4TOTAL, T3FREE, THYROIDAB in the last 72 hours.  Invalid  input(s): FREET3   Coagulation profile No results for input(s): INR, PROTIME in the last 168 hours. ------------------------------------------------------------------------------------------------------------------- No results for input(s): DDIMER in the last 72 hours. -------------------------------------------------------------------------------------------------------------------  Cardiac Enzymes  Recent Labs Lab 07/31/16 1733  TROPONINI <0.03   ------------------------------------------------------------------------------------------------------------------ Invalid input(s): POCBNP  ---------------------------------------------------------------------------------------------------------------  Urinalysis    Component Value Date/Time   COLORURINE AMBER (A) 03/03/2016 1228   APPEARANCEUR HAZY (A) 03/03/2016 1228   APPEARANCEUR Cloudy 10/27/2012 1810   LABSPEC 1.017 03/03/2016 1228   LABSPEC 1.049 10/27/2012 1810   PHURINE 5.0 03/03/2016 1228   GLUCOSEU NEGATIVE 03/03/2016 1228   GLUCOSEU Negative 10/27/2012 1810   HGBUR 2+ (A) 03/03/2016 1228   BILIRUBINUR NEGATIVE 03/03/2016 1228   BILIRUBINUR 2+ 10/27/2012 1810   KETONESUR TRACE (A) 03/03/2016 1228   PROTEINUR NEGATIVE 03/03/2016 1228   NITRITE NEGATIVE 03/03/2016 1228   LEUKOCYTESUR 1+ (A) 03/03/2016 1228   LEUKOCYTESUR Negative 10/27/2012 1810     RADIOLOGY: Dg Chest 2 View  Result Date: 07/31/2016 CLINICAL DATA:  Initial evaluation for lower extremity swelling with shortness of breath. EXAM: CHEST  2 VIEW COMPARISON:  Prior radiograph from 07/14/2016. FINDINGS: Accentuation of the cardiac silhouette related to AP technique and shallow lung inflation. Mediastinal silhouette within normal limits. Diffuse vascular congestion with interstitial prominence, suggesting pulmonary interstitial edema. Superimposed bibasilar  atelectasis. Small bilateral pleural effusions. No focal infiltrates. No pneumothorax. No acute  osseous abnormality. IMPRESSION: 1. Diffusely increased vascular congestion with interstitial prominence, consistent with pulmonary interstitial edema. 2. Small bilateral pleural effusions with bibasilar atelectasis. Electronically Signed   By: Rise Mu M.D.   On: 07/31/2016 18:21   US Venous Img Lower Bilateral  Result Date: 07/31/2016 EXAM: BILATERAL LOWER EXTREMITY VENOUS DOPPLER ULTRASOUND TECHNIQUE: Gray-scale sonography with graded compression, as well as color Doppler and duplex ultrasound were performed to evaluate the lower extremity deep venous systems from the level of the common femoral vein and including the common femoral, femoral, profunda femoral, popliteal and calf veins including the posterior tibial, peroneal and gastrocnemius veins when visible. The superficial great saphenous vein was also interrogated. Spectral Doppler was utilized to evaluate flow at rest and with distal augmentation maneuvers in the common femoral, femoral and popliteal veins. COMPARISON:  None. FINDINGS: RIGHT LOWER EXTREMITY Common Femoral Vein: No evidence of thrombus. Normal compressibility, respiratory phasicity and response to augmentation. Saphenofemoral Junction: No evidence of thrombus. Normal compressibility and flow on color Doppler imaging. Profunda Femoral Vein: No evidence of thrombus. Normal compressibility and flow on color Doppler imaging. Femoral Vein: No evidence of thrombus. Normal compressibility, respiratory phasicity and response to augmentation. Popliteal Vein: No evidence of thrombus. Normal compressibility, respiratory phasicity and response to augmentation. Calf Veins: No evidence of thrombus. Normal compressibility and flow on color Doppler imaging. Superficial Great Saphenous Vein: No evidence of thrombus. Normal compressibility and flow on color Doppler imaging. Venous Reflux:  None. Other Findings: Enlarged right inguinal node measures 3.3 x 1.0 x 2.6 cm. Pitting edema. LEFT  LOWER EXTREMITY the Common Femoral Vein: No evidence of thrombus. Normal compressibility, respiratory phasicity and response to augmentation. Saphenofemoral Junction: No evidence of thrombus. Normal compressibility and flow on color Doppler imaging. Profunda Femoral Vein: No evidence of thrombus. Normal compressibility and flow on color Doppler imaging. Femoral Vein: No evidence of thrombus. Normal compressibility, respiratory phasicity and response to augmentation. Popliteal Vein: No evidence of thrombus. Normal compressibility, respiratory phasicity and response to augmentation. Calf Veins: No evidence of thrombus. Normal compressibility and flow on color Doppler imaging. Superficial Great Saphenous Vein: No evidence of thrombus. Normal compressibility and flow on color Doppler imaging. Venous Reflux:  None. Other Findings: Enlarged left inguinal node measures 3.4 x 0.9 x 1.7 cm. Pitting edema. IMPRESSION: 1. No evidence of deep venous thrombosis. 2. Enlarged bilateral inguinal adenopathy as above, indeterminate. Electronically Signed   By: Rise Mu M.D.   On: 07/31/2016 19:03    EKG: Orders placed or performed during the hospital encounter of 07/31/16  . ED EKG  . ED EKG  . ED EKG  . ED EKG    IMPRESSION AND PLAN: Patient is a 65 year old white male with history of alcohol abuse in the past presenting with CHF  1. 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 Skin rash with itching Etiology unclear, I will try some prednisone and Benadryl as needed  3. BPH continue Flomax  4. Nicotine abuse recommended patient stop smoking 4 minutes spent patient will be continued on nicotine replacement  5. History alcohol abuse no use for the past 1 month will monitor for any symptoms   All the records are reviewed and case discussed with ED provider. Management plans discussed with the patient, family and they are in  agreement.  CODE STATUS: Code Status History    Date  Active Date Inactive Code Status Order ID Comments User Context   03/10/2016  4:42 PM 03/14/2016  7:07 PM Full Code [id number]  Katha HammingSnehalatha Konidena, MD ED   03/03/2016 11:09 PM 03/08/2016  4:01 PM Full Code [id number]  Gery Prayebby Crosley, MD Inpatient   12/03/2015  2:39 PM 12/09/2015 10:24 PM Full Code [id number]  Katha HammingSnehalatha Konidena, MD ED   06/18/2015 10:38 AM 06/20/2015  2:52 PM Full Code [id number]  Alford Highlandichard Wieting, MD ED       TOTAL TIME TAKING CARE OF THIS PATIENT:55 minutes.    Auburn BilberryPATEL, Malaka Ruffner M.D on 07/31/2016 at 7:54 PM  Between 7am to 6pm - Pager - [phone]  After 6pm go to [email or web address] - password [email or web address] [facility]  [facility]  Office  [phone]  CC: Primary care physician; No PCP Per Patient

## 2016-07-31 NOTE — ED Triage Notes (Signed)
Pt c/o feet swelling for 10 days and dyspnea with exertion. Ambulatory to triage. unlabored at this time. Also still c/o rash he was seen for a couple weeks ago. No pain at this time.

## 2016-07-31 NOTE — ED Provider Notes (Signed)
Time Seen: Approximately 1730  I have reviewed the triage notes  Chief Complaint: Foot Swelling and Rash   History of Present Illness: Luis [name] is a 65 y.o. male who presents with a myriad of concerns mainly increasing shortness of breath and bilateral lower extremity swelling. Patient was seen here recently for a rash and was prescribed Eucerin cream. Patient states the rash is now expanded beyond his lower extremities but also in bilateral upper extremities. He states occasionally it is pruritic in nature. He denies any chest pain but states shortness of breath especially when he tries to lie flat. Patient has a long history of alcohol abuse and malnutrition.   Past Medical History:  Diagnosis Date  . Alcohol abuse   . GERD (gastroesophageal reflux disease)     Patient Active Problem List   Diagnosis Date Noted  . Pressure ulcer 03/12/2016  . Failure to thrive (0-17) 03/10/2016  . Protein-calorie malnutrition, severe 03/04/2016  . 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 Alcohol abuse 12/07/2015  . Delirium tremens ([place]) 12/07/2015  . Malnutrition of moderate degree 12/04/2015  . Hyponatremia 06/18/2015    Past Surgical History:  Procedure Laterality Date  . CHOLECYSTECTOMY    . ESOPHAGOGASTRODUODENOSCOPY N/A 03/12/2016   Procedure: ESOPHAGOGASTRODUODENOSCOPY (EGD);  Surgeon: Manya Silvas, MD;  [facility] ENDOSCOPY;  Service: Endoscopy;  Laterality: N/A;    Past Surgical History:  Procedure Laterality Date  . CHOLECYSTECTOMY    . ESOPHAGOGASTRODUODENOSCOPY N/A 03/12/2016   Procedure: ESOPHAGOGASTRODUODENOSCOPY (EGD);  Surgeon: Manya Silvas, MD;  Location: [facility] ENDOSCOPY;  Service: Endoscopy;  Laterality: N/A;    Current Outpatient Rx  . Order #: [id number] Class: Historical Med  . Order #: [id number] Class: Print  . Order #: [id number] Class: Print  . Order #: [id number] Class: Print  . Order #: [id number] Class: Print   . Order #: [id number] Class: Print  . Order #: [id number] Class: Print    Allergies:  Patient has no known allergies.  Family History: Family History  Problem Relation Age of Onset  . Alcohol abuse Mother   . Alcohol abuse Father     Social History: Social History  Substance Use Topics  . Smoking status: Current Every Day Smoker    Packs/day: 0.50    Types: Cigarettes  . Smokeless tobacco: Never Used  . Alcohol use No     Comment: stopped etoh 2 weeks ago     Review of Systems:   10 point review of systems was performed and was otherwise negative:  Constitutional: No fever Eyes: No visual disturbances ENT: No sore throat, ear pain Cardiac: No chest pain Respiratory: Shortness of breath without any stridor and some audible wheezing Abdomen: No abdominal pain, no vomiting, No diarrhea Endocrine: No weight loss, No night sweats Extremities: Bilateral peripheral edema without cyanosis Skin: Diffuse punctate rash across both lower and upper extremities Neurologic: No focal weakness, trouble with speech or swollowing Urologic: No dysuria, Hematuria, or urinary frequency   Physical Exam:  ED Triage Vitals  Enc Vitals Group     BP 07/31/16 1640 (!) 167/94     Pulse Rate 07/31/16 1640 (!) 104     Resp 07/31/16 1640 16     Temp 07/31/16 1640 97.5 F (36.4 C)     Temp Source 07/31/16 1640 Oral     SpO2 07/31/16 1640 100 %     Weight 07/31/16 1641 155 lb (70.3 kg)     Height 07/31/16 1641 6' (1.829 m)  Head Circumference --      Peak Flow --      Pain Score --      Pain Loc --      Pain Edu? --      Excl. in [place]? --     General: Awake , Alert , and Oriented times 3; GCS 15Very anxious Head: Normal cephalic , atraumatic Eyes: Pupils equal , round, reactive to light Nose/Throat: No nasal drainage, patent upper airway without erythema or exudate.  Neck: Supple, Full range of motion, No anterior adenopathy or palpable thyroid masses Lungs: Coarse rales heard  bilaterally at the bases with some end expiratory wheezing heard primarily at the apices  Heart: Regular rate, regular rhythm without murmurs , gallops , or rubs Abdomen: Soft, non tender without rebound, guarding , or rigidity; bowel sounds positive and symmetric in all 4 quadrants. No organomegaly .        Extremities: Bilateral circumferential pitting edema in both lower extremities Neurologic: normal ambulation, Motor symmetric without deficits, sensory intact Skin: Diffuse punctate scabbed over lesions noticed on both lower and upper extremities. No obvious cellulitic type pattern.   Labs:   All laboratory work was reviewed including any pertinent negatives or positives listed below:  Labs Reviewed  CBC WITH DIFFERENTIAL/PLATELET - Abnormal; Notable for the following:       Result Value   RBC 2.94 (*)    Hemoglobin 9.0 (*)    HCT 26.7 (*)    RDW 17.8 (*)    Platelets 478 (*)    Neutro Abs 6.9 (*)    Monocytes Absolute 1.2 (*)    All other components within normal limits  COMPREHENSIVE METABOLIC PANEL - Abnormal; Notable for the following:    Calcium 8.4 (*)    Albumin 3.0 (*)    Total Bilirubin <0.1 (*)    All other components within normal limits  BRAIN NATRIURETIC PEPTIDE - Abnormal; Notable for the following:    B Natriuretic Peptide 1,493.0 (*)    All other components within normal limits  TROPONIN I  AMMONIA  VITAMIN B1  Patient still significantly anemic  EKG:  ED ECG REPORT I, Daymon Larsen, the attending physician, personally viewed and interpreted this ECG.  Date: 07/31/2016 EKG Time: 1732 Rate: 98 Rhythm: normal sinus rhythm with occasional PVCs QRS Axis: normal Intervals: normal ST/T Wave abnormalities: normal Conduction Disturbances: none Narrative Interpretation: unremarkable Poor R-wave progression across the septal leads No obvious acute ischemic changes   Radiology:  "Dg Chest 2 View  Result Date: 07/31/2016 CLINICAL DATA:  Initial  evaluation for lower extremity swelling with shortness of breath. EXAM: CHEST  2 VIEW COMPARISON:  Prior radiograph from 07/14/2016. FINDINGS: Accentuation of the cardiac silhouette related to AP technique and shallow lung inflation. Mediastinal silhouette within normal limits. Diffuse vascular congestion with interstitial prominence, suggesting pulmonary interstitial edema. Superimposed bibasilar atelectasis. Small bilateral pleural effusions. No focal infiltrates. No pneumothorax. No acute osseous abnormality. IMPRESSION: 1. Diffusely increased vascular congestion with interstitial prominence, consistent with pulmonary interstitial edema. 2. Small bilateral pleural effusions with bibasilar atelectasis. Electronically Signed   By: Jeannine Boga M.D.   On: 07/31/2016 18:21   Dg Chest 2 View  Result Date: 07/14/2016 CLINICAL DATA:  Patient with fatigue for 2 weeks. EXAM: CHEST  2 VIEW COMPARISON:  Chest radiograph 03/03/2016. FINDINGS: Normal cardiac and mediastinal contours. No consolidative pulmonary opacities. No pleural effusion or pneumothorax. Age-indeterminate mild height loss of the lower thoracic spine vertebral body. IMPRESSION: No  acute cardiopulmonary process. Age-indeterminate height loss superior endplate lower thoracic spine vertebral body. Recommend correlation for point tenderness. Electronically Signed   By: Lovey Newcomer M.D.   On: 07/14/2016 09:51   US Venous Img Lower Bilateral  Result Date: 07/31/2016 EXAM: BILATERAL LOWER EXTREMITY VENOUS DOPPLER ULTRASOUND TECHNIQUE: Gray-scale sonography with graded compression, as well as color Doppler and duplex ultrasound were performed to evaluate the lower extremity deep venous systems from the level of the common femoral vein and including the common femoral, femoral, profunda femoral, popliteal and calf veins including the posterior tibial, peroneal and gastrocnemius veins when visible. The superficial great saphenous vein was also  interrogated. Spectral Doppler was utilized to evaluate flow at rest and with distal augmentation maneuvers in the common femoral, femoral and popliteal veins. COMPARISON:  None. FINDINGS: RIGHT LOWER EXTREMITY Common Femoral Vein: No evidence of thrombus. Normal compressibility, respiratory phasicity and response to augmentation. Saphenofemoral Junction: No evidence of thrombus. Normal compressibility and flow on color Doppler imaging. Profunda Femoral Vein: No evidence of thrombus. Normal compressibility and flow on color Doppler imaging. Femoral Vein: No evidence of thrombus. Normal compressibility, respiratory phasicity and response to augmentation. Popliteal Vein: No evidence of thrombus. Normal compressibility, respiratory phasicity and response to augmentation. Calf Veins: No evidence of thrombus. Normal compressibility and flow on color Doppler imaging. Superficial Great Saphenous Vein: No evidence of thrombus. Normal compressibility and flow on color Doppler imaging. Venous Reflux:  None. Other Findings: Enlarged right inguinal node measures 3.3 x 1.0 x 2.6 cm. Pitting edema. LEFT LOWER EXTREMITY the Common Femoral Vein: No evidence of thrombus. Normal compressibility, respiratory phasicity and response to augmentation. Saphenofemoral Junction: No evidence of thrombus. Normal compressibility and flow on color Doppler imaging. Profunda Femoral Vein: No evidence of thrombus. Normal compressibility and flow on color Doppler imaging. Femoral Vein: No evidence of thrombus. Normal compressibility, respiratory phasicity and response to augmentation. Popliteal Vein: No evidence of thrombus. Normal compressibility, respiratory phasicity and response to augmentation. Calf Veins: No evidence of thrombus. Normal compressibility and flow on color Doppler imaging. Superficial Great Saphenous Vein: No evidence of thrombus. Normal compressibility and flow on color Doppler imaging. Venous Reflux:  None. Other Findings:  Enlarged left inguinal node measures 3.4 x 0.9 x 1.7 cm. Pitting edema. IMPRESSION: 1. No evidence of deep venous thrombosis. 2. Enlarged bilateral inguinal adenopathy as above, indeterminate. Electronically Signed   By: Jeannine Boga M.D.   On: 07/31/2016 19:03  "  I personally reviewed the radiologic studies     ED Course:  Patient's stay here was uneventful and I added a thiamine level. His BNP is significantly elevated and there is findings of pulmonary edema on his chest x-ray. 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

## 2016-07-31 NOTE — ED Notes (Signed)
In to discuss ReDS Lung assessment study with pt, he has refused/declined to participate; informed pt's nurse and admitting MD of same;

## 2016-08-01 ENCOUNTER — Inpatient Hospital Stay
Admit: 2016-08-01 | Discharge: 2016-08-01 | Disposition: A | Discharge status: Home / Self Care | Payer: Medicare HMO | Attending: Internal Medicine | Admitting: Internal Medicine

## 2016-08-01 DIAGNOSIS — I4891 Unspecified atrial fibrillation: Secondary | ICD-10-CM | POA: Diagnosis present

## 2016-08-01 LAB — BASIC METABOLIC PANEL
Anion gap: 7 (ref 5–15)
BUN: 18 mg/dL (ref 6–20)
CALCIUM: 8.2 mg/dL — AB (ref 8.9–10.3)
CO2: 24 mmol/L (ref 22–32)
CREATININE: 0.99 mg/dL (ref 0.61–1.24)
Chloride: 110 mmol/L (ref 101–111)
GFR calc Af Amer: >60 mL/min (ref >60)
GLUCOSE: 157 mg/dL — AB (ref 65–99)
Potassium: 4.1 mmol/L (ref 3.5–5.1)
Sodium: 141 mmol/L (ref 135–145)

## 2016-08-01 LAB — ECHOCARDIOGRAM COMPLETE
HEIGHTINCHES: 72 in
WEIGHTICAEL: 2584 [oz_av]

## 2016-08-01 LAB — CBC
HEMATOCRIT: 22.2 % — AB (ref 40.0–52.0)
Hemoglobin: 7.6 g/dL — ABNORMAL LOW (ref 13.0–18.0)
MCH: 30.1 pg (ref 26.0–34.0)
MCHC: 34 g/dL (ref 32.0–36.0)
MCV: 88.4 fL (ref 80.0–100.0)
PLATELETS: 406 10*3/uL (ref 150–440)
RBC: 2.51 MIL/uL — ABNORMAL LOW (ref 4.40–5.90)
RDW: 17.9 % — AB (ref 11.5–14.5)
WBC: 6.5 10*3/uL (ref 3.8–10.6)

## 2016-08-01 LAB — TROPONIN I

## 2016-08-01 LAB — VITAMIN B12: Vitamin B-12: 286 pg/mL (ref 180–914)

## 2016-08-01 LAB — TSH: TSH: 3.364 u[IU]/mL (ref 0.350–4.500)

## 2016-08-01 MED ORDER — ENALAPRIL MALEATE 5 MG PO TABS
5.0000 mg | ORAL_TABLET | Freq: Every day | ORAL | Status: DC
  Administered 2016-08-01 – 2016-08-05 (×5): 5 mg via ORAL
  Filled 2016-08-01 (×5): qty 1

## 2016-08-01 MED ORDER — SODIUM CHLORIDE 0.9 % IV SOLN
200.0000 mg | INTRAVENOUS | Status: AC
  Administered 2016-08-01 – 2016-08-03 (×3): 200 mg via INTRAVENOUS
  Filled 2016-08-01 (×3): qty 10

## 2016-08-01 MED ORDER — METOPROLOL TARTRATE 50 MG PO TABS
50.0000 mg | ORAL_TABLET | Freq: Two times a day (BID) | ORAL | Status: DC
  Administered 2016-08-01 – 2016-08-02 (×3): 50 mg via ORAL
  Filled 2016-08-01 (×4): qty 1

## 2016-08-01 NOTE — Clinical Social Work Note (Addendum)
MSW received phone call from [facility] which is where patient is currently residing.  They can not take patient back until he has received some physical therapy rehab, because patient needs to be completely independent with walking and ADLs in order to return.  MSW requested bedside nurse to see if OT and PT can be ordered by physician.  Patient will also need insurance approval before he is able to go to a SNF for rehab.  MSW to continue to follow patient's progress throughout discharge planning.  Ervin KnackEric R. Gentry Pilson, MSW [phone]  Mon-Fri 8a-4:30p 08/01/2016 3:09 PM

## 2016-08-01 NOTE — Progress Notes (Signed)
[employer] [facility] - [place] at [facility]   PATIENT NAME: Luis [name]    MR#:  [id number]  DATE OF BIRTH:  Dec 19, 1950  SUBJECTIVE:  CHIEF COMPLAINT:   Chief Complaint  Patient presents with  . Foot Swelling  . Rash   - Admitted with lower extremity swelling, dyspnea. -Noted to have a drop in hemoglobin.  REVIEW OF SYSTEMS:  Review of Systems  Constitutional: Positive for malaise/fatigue. Negative for chills and fever.  HENT: Positive for hearing loss. Negative for ear discharge, ear pain and nosebleeds.   Respiratory: Positive for cough and shortness of breath. Negative for wheezing.   Cardiovascular: Positive for leg swelling. Negative for chest pain and palpitations.  Gastrointestinal: Negative for abdominal pain, constipation, diarrhea, nausea and vomiting.  Genitourinary: Negative for dysuria and urgency.  Musculoskeletal: Negative for myalgias.  Neurological: Negative for dizziness, sensory change, speech change, focal weakness, seizures and headaches.  Psychiatric/Behavioral: Negative for depression.    DRUG ALLERGIES:  No Known Allergies  VITALS:  Blood pressure (!) 141/89, pulse 99, temperature 98.2 F (36.8 C), temperature source Oral, resp. rate 18, height 6' (1.829 m), weight 73.3 kg (161 lb 8 oz), SpO2 97 %.  PHYSICAL EXAMINATION:  Physical Exam  GENERAL:  65 y.o.-year-old patient lying in the bed with no acute distress.  EYES: Pupils equal, round, reactive to light and accommodation. No scleral icterus. Extraocular muscles intact.  HEENT: Head atraumatic, normocephalic. Oropharynx and nasopharynx clear.  NECK:  Supple, no jugular venous distention. No thyroid enlargement, no tenderness.  LUNGS: Normal breath sounds bilaterally, no wheezing, rales,rhonchi or crepitation. Occasional coarse rhonchi at the bases noted. No use of accessory muscles of respiration.  CARDIOVASCULAR: S1, S2 normal. Norubs, or gallops. 2/6 systolic murmur is  present ABDOMEN: Soft, nontender, nondistended. Bowel sounds present. No organomegaly or mass.  EXTREMITIES: No  cyanosis, or clubbing. 2+ lower extremity edema noted NEUROLOGIC: Cranial nerves II through XII are intact. Muscle strength 5/5 in all extremities. Sensation intact. Gait not checked.  PSYCHIATRIC: The patient is alert and oriented x 3.  SKIN: Improved rash on the arms and also upper back. Macular rash. Scratch marks and scabs from increased scratching noted   LABORATORY PANEL:   CBC  Recent Labs Lab 08/01/16 0519  WBC 6.5  HGB 7.6*  HCT 22.2*  PLT 406   ------------------------------------------------------------------------------------------------------------------  Chemistries   Recent Labs Lab 07/31/16 1733 08/01/16 0519  NA 141 141  K 4.0 4.1  CL 110 110  CO2 24 24  GLUCOSE 97 157*  BUN 17 18  CREATININE 0.97 0.99  CALCIUM 8.4* 8.2*  AST 40  --   ALT 22  --   ALKPHOS 124  --   BILITOT <0.1*  --    ------------------------------------------------------------------------------------------------------------------  Cardiac Enzymes  Recent Labs Lab 08/01/16 0519  TROPONINI <0.03   ------------------------------------------------------------------------------------------------------------------  RADIOLOGY:  Dg Chest 2 View  Result Date: 07/31/2016 CLINICAL DATA:  Initial evaluation for lower extremity swelling with shortness of breath. EXAM: CHEST  2 VIEW COMPARISON:  Prior radiograph from 07/14/2016. FINDINGS: Accentuation of the cardiac silhouette related to AP technique and shallow lung inflation. Mediastinal silhouette within normal limits. Diffuse vascular congestion with interstitial prominence, suggesting pulmonary interstitial edema. Superimposed bibasilar atelectasis. Small bilateral pleural effusions. No focal infiltrates. No pneumothorax. No acute osseous abnormality. IMPRESSION: 1. Diffusely increased vascular congestion with interstitial  prominence, consistent with pulmonary interstitial edema. 2. Small bilateral pleural effusions with bibasilar atelectasis. Electronically Signed   By: Rise Mu  M.D.   On: 07/31/2016 18:21   [place] Venous Img Lower Bilateral  Result Date: 07/31/2016 EXAM: BILATERAL LOWER EXTREMITY VENOUS DOPPLER ULTRASOUND TECHNIQUE: Gray-scale sonography with graded compression, as well as color Doppler and duplex ultrasound were performed to evaluate the lower extremity deep venous systems from the level of the common femoral vein and including the common femoral, femoral, profunda femoral, popliteal and calf veins including the posterior tibial, peroneal and gastrocnemius veins when visible. The superficial great saphenous vein was also interrogated. Spectral Doppler was utilized to evaluate flow at rest and with distal augmentation maneuvers in the common femoral, femoral and popliteal veins. COMPARISON:  None. FINDINGS: RIGHT LOWER EXTREMITY Common Femoral Vein: No evidence of thrombus. Normal compressibility, respiratory phasicity and response to augmentation. Saphenofemoral Junction: No evidence of thrombus. Normal compressibility and flow on color Doppler imaging. Profunda Femoral Vein: No evidence of thrombus. Normal compressibility and flow on color Doppler imaging. Femoral Vein: No evidence of thrombus. Normal compressibility, respiratory phasicity and response to augmentation. Popliteal Vein: No evidence of thrombus. Normal compressibility, respiratory phasicity and response to augmentation. Calf Veins: No evidence of thrombus. Normal compressibility and flow on color Doppler imaging. Superficial Great Saphenous Vein: No evidence of thrombus. Normal compressibility and flow on color Doppler imaging. Venous Reflux:  None. Other Findings: Enlarged right inguinal node measures 3.3 x 1.0 x 2.6 cm. Pitting edema. LEFT LOWER EXTREMITY the Common Femoral Vein: No evidence of thrombus. Normal compressibility,  respiratory phasicity and response to augmentation. Saphenofemoral Junction: No evidence of thrombus. Normal compressibility and flow on color Doppler imaging. Profunda Femoral Vein: No evidence of thrombus. Normal compressibility and flow on color Doppler imaging. Femoral Vein: No evidence of thrombus. Normal compressibility, respiratory phasicity and response to augmentation. Popliteal Vein: No evidence of thrombus. Normal compressibility, respiratory phasicity and response to augmentation. Calf Veins: No evidence of thrombus. Normal compressibility and flow on color Doppler imaging. Superficial Great Saphenous Vein: No evidence of thrombus. Normal compressibility and flow on color Doppler imaging. Venous Reflux:  None. Other Findings: Enlarged left inguinal node measures 3.4 x 0.9 x 1.7 cm. Pitting edema. IMPRESSION: 1. No evidence of deep venous thrombosis. 2. Enlarged bilateral inguinal adenopathy as above, indeterminate. Electronically Signed   By: Rise MuBenjamin  McClintock M.D.   On: 07/31/2016 19:03    EKG:   Orders placed or performed during the hospital encounter of 07/31/16  . ED EKG  . ED EKG  . ED EKG  . ED EKG    ASSESSMENT AND PLAN:   65 year old male with past medical history significant for alcohol abuse, GERD presents to hospital secondary to worsening shortness of breath and also lower extremity edema.  #1 acute CHF-cardiomyopathy and pulmonary vascular congestion noted on chest x-ray. 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 Echocardiogram ordered. -Continue IV Lasix. If you feel is dropped, cardiology also consulted to rule out ischemic causes.  #2 acute on chronic anemia- hemoglobin dropped from 12 to 9 in June 2017 and patient had an EGD done by GI at the time that showed duodenitis and nonbleeding ulcers. -No variceal bleed noted at the time. He has a colonoscopy done in 2014 the results of which are not available but patient states he was reported to be normal -Iron levels  are still low. Started on IV iron. -If continues to drop, then we will consult GI.  #3 skin rash- dry skin, increased scratch marks Improving with Eucerin cream and prednisone  #4 tobacco use disorder-nicotine patch  #5 duodenal ulcer and duodenitis-continue Protonix.  Discontinue naproxen that patient was taking  #6 DVT prophylaxis-discontinue Lovenox due to anemia. -Do Ted's and SCDs     All the records are reviewed and case discussed with Care Management/Social Workerr. Management plans discussed with the patient, family and they are in agreement.  CODE STATUS: Full code  TOTAL TIME TAKING CARE OF THIS PATIENT: 37 minutes.   POSSIBLE D/C IN 2 DAYS, DEPENDING ON CLINICAL CONDITION.   Enid BaasKALISETTI,Iktan Aikman M.D on 08/01/2016 at 9:17 AM  Between 7am to 6pm - Pager - [phone]  After 6pm go to [email or web address] - Social research officer, governmentpassword EPAS [facility]  [facility]  Office  [phone]  CC: Primary care physician; No PCP Per Patient

## 2016-08-01 NOTE — Progress Notes (Signed)
Initial Nutrition Assessment  DOCUMENTATION CODES:   Non-severe (moderate) malnutrition in context of chronic illness  INTERVENTION:  1. Continue Ensure Enlive po BID, each supplement provides 350 kcal and 20 grams of protein. Patient claims he consumes these at home regularly.  2. Encourage/Monitor PO intake. Patient states he only eats 1 meal at home because "he was too busy doing other stuff." Claims he ate 100% of his breakfast this morning but can't remember what he had.  NUTRITION DIAGNOSIS:   Malnutrition related to chronic illness as evidenced by moderate depletion of body fat, moderate depletions of muscle mass, moderate to severe fluid accumulation.  GOAL:   Patient will meet greater than or equal to 90% of their needs  MONITOR:   PO intake, I & O's, Labs, Weight trends, Supplement acceptance  REASON FOR ASSESSMENT:   Malnutrition Screening Tool    ASSESSMENT:   Luis [name]  is a 65 y.o. male with a known history of  Alcohol abuse, GERD who has not drank in more than a month. Who presents to the ED with complaint of lower extremity swelling and shortness of breath. Patient reports that he was seen in the emergency room on October 23 at that time he was diagnosed with dehydration and was given aggressive IV fluids.  [name] states he normally eats 1 big meal per day at home before bed and claims he drinks ensure/boost first thing in the morning.  He admits to a 20# wt loss over the past 2 months, but per chart he has gained significant weight since he got down to 127# back in June. His lower extremities are significantly swollen so this certainly plays a role in his weight but overall patient looks much better compared to previous admissions. He was consuming an Ensure during my visit, likes them and wants to continue to have them.  Nutrition-Focused physical exam completed. Findings are moderate-severe fat depletion, moderate muscle depletion, and moderate-severe  edema.   Labs and medications reviewed: Prednisone  Diet Order:  Diet Heart Room service appropriate? Yes; Fluid consistency: Thin  Skin:  Reviewed, no issues  Last BM:  07/31/2016  Height:   Ht Readings from Last 1 Encounters:  07/31/16 6' (1.829 m)    Weight:   Wt Readings from Last 1 Encounters:  07/31/16 161 lb 8 oz (73.3 kg)    Ideal Body Weight:  80.9 kg  BMI:  Body mass index is 21.9 kg/m.  Estimated Nutritional Needs:   Kcal:  1700-2000 calories  Protein:  73-87 gm  Fluid:  >/= 1.7L  EDUCATION NEEDS:   No education needs identified at this time  Dionne AnoWilliam M. Vee Bahe, MS, RD LDN Inpatient Clinical Dietitian Pager [phone]

## 2016-08-01 NOTE — Consult Note (Signed)
Luis [name] is a 65 y.o. male  [id number]  Primary Cardiologist: Adrian Blackwater Reason for Consultation: Cardiomyopathy  HPI: Luis [name] presents with a 2 week history of lower leg swelling and shortness of breath with exertion like bending over to tie his shoes and walking up stairs. He denies any previous cardiac history except for murmur diagnosed in the [employer] in early adulthood, however he uses alcohol daily down from 12 beers per day to 6 beers per day, smoking 1/2 pack cigarettes per day since age 35 and GERD. He denies hypertension, diabetes and hyperlipidemia. He does not have regular medical care. He is living in a [facility].   Review of Systems: Positive for DOE, mild orthopnea (sleep on his couch), leg swelling, dizziness when stand up too fast-fell last week, palpitations, cough and wheezing. Negative for chest pain/pressure/tightness   Past Medical History:  Diagnosis Date  . Alcohol abuse   . BPH (benign prostatic hyperplasia)   . GERD (gastroesophageal reflux disease)     Medications Prior to Admission  Medication Sig Dispense Refill  . esomeprazole (NEXIUM) 20 MG capsule Take 20 mg by mouth daily at 12 noon. Reported on 03/03/2016    . feeding supplement, ENSURE ENLIVE, (ENSURE ENLIVE) LIQD Take 237 mLs by mouth 2 (two) times daily between meals. 237 mL 0  . Skin Protectants, Misc. (EUCERIN) cream Apply topically as needed for wound care. 397 g 0  . mirtazapine (REMERON) 30 MG tablet Take 1 tablet (30 mg total) by mouth at bedtime. (Patient not taking: Reported on 07/31/2016) 30 tablet 0  . naproxen (NAPROSYN) 500 MG tablet Take 1 tablet (500 mg total) by mouth 2 (two) times daily with a meal. (Patient not taking: Reported on 07/31/2016) 20 tablet 2  . nicotine (NICODERM CQ - DOSED IN MG/24 HOURS) 21 mg/24hr patch Place 1 patch (21 mg total) onto the skin daily as needed (if patient is current tobacco user and requests patch). (Patient not taking: Reported on  07/31/2016) 28 patch 0  . tamsulosin (FLOMAX) 0.4 MG CAPS capsule Take 1 capsule (0.4 mg total) by mouth daily. (Patient not taking: Reported on 07/31/2016) 30 capsule 2     . feeding supplement (ENSURE ENLIVE)  237 mL Oral BID BM  . furosemide  40 mg Intravenous Q12H  . iron sucrose  200 mg Intravenous Q24H  . mirtazapine  30 mg Oral QHS  . pantoprazole  40 mg Oral Daily  . predniSONE  20 mg Oral Q breakfast  . sodium chloride flush  3 mL Intravenous Q12H  . tamsulosin  0.4 mg Oral Daily    Infusions:   No Known Allergies  Social History   Social History  . Marital status: Legally Separated    Spouse name: N/A  . Number of children: N/A  . Years of education: N/A   Occupational History  . Not on file.   Social History Main Topics  . Smoking status: Current Every Day Smoker    Packs/day: 0.50    Types: Cigarettes  . Smokeless tobacco: Never Used  . Alcohol use No     Comment: stopped etoh 2 weeks ago  . Drug use: No  . Sexual activity: Not on file   Other Topics Concern  . Not on file   Social History Narrative  . No narrative on file    Family History  Problem Relation Age of Onset  . Alcohol abuse Mother   . Alcohol abuse Father  PHYSICAL EXAM: Vitals:   08/01/16 0731 08/01/16 1215  BP: (!) 141/89 137/75  Pulse: 99 (!) 101  Resp: 18 15  Temp: 98.2 F (36.8 C) 97.4 F (36.3 C)     Intake/Output Summary (Last 24 hours) at 08/01/16 1724 Last data filed at 08/01/16 1300  Gross per 24 hour  Intake              240 ml  Output             2950 ml  Net            -2710 ml    General:  Thin. No respiratory difficulty HEENT: normal Neck: supple. no JVD. Carotids 2+ bilat; no bruits. No lymphadenopathy or thryomegaly appreciated. Cor: PMI nondisplaced. Regular rate & rhythm. No rubs, gallops or murmurs. Lungs: Coarse expiratory wheezes Abdomen: soft, nontender, nondistended. No hepatosplenomegaly. No bruits or masses. Good bowel  sounds. Extremities: no cyanosis, clubbing, diffuse rash over arms and legs, lower legs with trace edema Neuro: alert & oriented x 3, cranial nerves grossly intact. moves all 4 extremities w/o difficulty. Affect pleasant.  ECG: Sinus rhythm with PVC's, 98 beats per minute, Q waves in anterior leads- possible old infarct.  Results for orders placed or performed during the hospital encounter of 07/31/16 (from the past 24 hour(s))  CBC with Differential/Platelet     Status: Abnormal   Collection Time: 07/31/16  5:33 PM  Result Value Ref Range   WBC 10.1 3.8 - 10.6 K/uL   RBC 2.94 (L) 4.40 - 5.90 MIL/uL   Hemoglobin 9.0 (L) 13.0 - 18.0 g/dL   HCT 16.126.7 (L) 09.640.0 - 04.552.0 %   MCV 90.8 80.0 - 100.0 fL   MCH 30.5 26.0 - 34.0 pg   MCHC 33.6 32.0 - 36.0 g/dL   RDW 40.917.8 (H) 81.111.5 - 91.414.5 %   Platelets 478 (H) 150 - 440 K/uL   Neutrophils Relative % 67 %   Neutro Abs 6.9 (H) 1.4 - 6.5 K/uL   Lymphocytes Relative 14 %   Lymphs Abs 1.4 1.0 - 3.6 K/uL   Monocytes Relative 12 %   Monocytes Absolute 1.2 (H) 0.2 - 1.0 K/uL   Eosinophils Relative 6 %   Eosinophils Absolute 0.6 0 - 0.7 K/uL   Basophils Relative 1 %   Basophils Absolute 0.1 0 - 0.1 K/uL  Comprehensive metabolic panel     Status: Abnormal   Collection Time: 07/31/16  5:33 PM  Result Value Ref Range   Sodium 141 135 - 145 mmol/L   Potassium 4.0 3.5 - 5.1 mmol/L   Chloride 110 101 - 111 mmol/L   CO2 24 22 - 32 mmol/L   Glucose, Bld 97 65 - 99 mg/dL   BUN 17 6 - 20 mg/dL   Creatinine, Ser 7.820.97 0.61 - 1.24 mg/dL   Calcium 8.4 (L) 8.9 - 10.3 mg/dL   Total Protein 6.7 6.5 - 8.1 g/dL   Albumin 3.0 (L) 3.5 - 5.0 g/dL   AST 40 15 - 41 U/L   ALT 22 17 - 63 U/L   Alkaline Phosphatase 124 38 - 126 U/L   Total Bilirubin <0.1 (L) 0.3 - 1.2 mg/dL   GFR calc non Af Amer >60 >60 mL/min   GFR calc Af Amer >60 >60 mL/min   Anion gap 7 5 - 15  Troponin I     Status: None   Collection Time: 07/31/16  5:33 PM  Result Value Ref Range  Troponin I  <0.03 <0.03 ng/mL  Brain natriuretic peptide     Status: Abnormal   Collection Time: 07/31/16  5:33 PM  Result Value Ref Range   B Natriuretic Peptide 1,493.0 (H) 0.0 - 100.0 pg/mL  Iron and TIBC     Status: Abnormal   Collection Time: 07/31/16  5:33 PM  Result Value Ref Range   Iron 18 (L) 45 - 182 ug/dL   TIBC 161 096 - 045 ug/dL   Saturation Ratios 5 (L) 17.9 - 39.5 %   UIBC 350 ug/dL  Vitamin W09     Status: None   Collection Time: 07/31/16  5:33 PM  Result Value Ref Range   Vitamin B-12 286 180 - 914 pg/mL  Folate     Status: None   Collection Time: 07/31/16  5:33 PM  Result Value Ref Range   Folate 11.7 >5.9 ng/mL  Ferritin     Status: Abnormal   Collection Time: 07/31/16  5:33 PM  Result Value Ref Range   Ferritin 23 (L) 24 - 336 ng/mL  Ammonia     Status: None   Collection Time: 07/31/16  6:18 PM  Result Value Ref Range   Ammonia 29 9 - 35 umol/L  Troponin I     Status: None   Collection Time: 07/31/16 11:55 PM  Result Value Ref Range   Troponin I <0.03 <0.03 ng/mL  TSH     Status: None   Collection Time: 07/31/16 11:55 PM  Result Value Ref Range   TSH 3.364 0.350 - 4.500 uIU/mL  Troponin I     Status: None   Collection Time: 08/01/16  5:19 AM  Result Value Ref Range   Troponin I <0.03 <0.03 ng/mL  Basic metabolic panel     Status: Abnormal   Collection Time: 08/01/16  5:19 AM  Result Value Ref Range   Sodium 141 135 - 145 mmol/L   Potassium 4.1 3.5 - 5.1 mmol/L   Chloride 110 101 - 111 mmol/L   CO2 24 22 - 32 mmol/L   Glucose, Bld 157 (H) 65 - 99 mg/dL   BUN 18 6 - 20 mg/dL   Creatinine, Ser 8.11 0.61 - 1.24 mg/dL   Calcium 8.2 (L) 8.9 - 10.3 mg/dL   GFR calc non Af Amer >60 >60 mL/min   GFR calc Af Amer >60 >60 mL/min   Anion gap 7 5 - 15  CBC     Status: Abnormal   Collection Time: 08/01/16  5:19 AM  Result Value Ref Range   WBC 6.5 3.8 - 10.6 K/uL   RBC 2.51 (L) 4.40 - 5.90 MIL/uL   Hemoglobin 7.6 (L) 13.0 - 18.0 g/dL   HCT 91.4 (L) 78.2 - 95.6  %   MCV 88.4 80.0 - 100.0 fL   MCH 30.1 26.0 - 34.0 pg   MCHC 34.0 32.0 - 36.0 g/dL   RDW 21.3 (H) 08.6 - 57.8 %   Platelets 406 150 - 440 K/uL   Dg Chest 2 View  Result Date: 07/31/2016 CLINICAL DATA:  Initial evaluation for lower extremity swelling with shortness of breath. EXAM: CHEST  2 VIEW COMPARISON:  Prior radiograph from 07/14/2016. FINDINGS: Accentuation of the cardiac silhouette related to AP technique and shallow lung inflation. Mediastinal silhouette within normal limits. Diffuse vascular congestion with interstitial prominence, suggesting pulmonary interstitial edema. Superimposed bibasilar atelectasis. Small bilateral pleural effusions. No focal infiltrates. No pneumothorax. No acute osseous abnormality. IMPRESSION: 1. Diffusely increased vascular congestion with interstitial  prominence, consistent with pulmonary interstitial edema. 2. Small bilateral pleural effusions with bibasilar atelectasis. Electronically Signed   By: Rise MuBenjamin  McClintock M.D.   On: 07/31/2016 18:21   [place] Venous Img Lower Bilateral  Result Date: 07/31/2016 EXAM: BILATERAL LOWER EXTREMITY VENOUS DOPPLER ULTRASOUND TECHNIQUE: Gray-scale sonography with graded compression, as well as color Doppler and duplex ultrasound were performed to evaluate the lower extremity deep venous systems from the level of the common femoral vein and including the common femoral, femoral, profunda femoral, popliteal and calf veins including the posterior tibial, peroneal and gastrocnemius veins when visible. The superficial great saphenous vein was also interrogated. Spectral Doppler was utilized to evaluate flow at rest and with distal augmentation maneuvers in the common femoral, femoral and popliteal veins. COMPARISON:  None. FINDINGS: RIGHT LOWER EXTREMITY Common Femoral Vein: No evidence of thrombus. Normal compressibility, respiratory phasicity and response to augmentation. Saphenofemoral Junction: No evidence of thrombus. Normal  compressibility and flow on color Doppler imaging. Profunda Femoral Vein: No evidence of thrombus. Normal compressibility and flow on color Doppler imaging. Femoral Vein: No evidence of thrombus. Normal compressibility, respiratory phasicity and response to augmentation. Popliteal Vein: No evidence of thrombus. Normal compressibility, respiratory phasicity and response to augmentation. Calf Veins: No evidence of thrombus. Normal compressibility and flow on color Doppler imaging. Superficial Great Saphenous Vein: No evidence of thrombus. Normal compressibility and flow on color Doppler imaging. Venous Reflux:  None. Other Findings: Enlarged right inguinal node measures 3.3 x 1.0 x 2.6 cm. Pitting edema. LEFT LOWER EXTREMITY the Common Femoral Vein: No evidence of thrombus. Normal compressibility, respiratory phasicity and response to augmentation. Saphenofemoral Junction: No evidence of thrombus. Normal compressibility and flow on color Doppler imaging. Profunda Femoral Vein: No evidence of thrombus. Normal compressibility and flow on color Doppler imaging. Femoral Vein: No evidence of thrombus. Normal compressibility, respiratory phasicity and response to augmentation. Popliteal Vein: No evidence of thrombus. Normal compressibility, respiratory phasicity and response to augmentation. Calf Veins: No evidence of thrombus. Normal compressibility and flow on color Doppler imaging. Superficial Great Saphenous Vein: No evidence of thrombus. Normal compressibility and flow on color Doppler imaging. Venous Reflux:  None. Other Findings: Enlarged left inguinal node measures 3.4 x 0.9 x 1.7 cm. Pitting edema. IMPRESSION: 1. No evidence of deep venous thrombosis. 2. Enlarged bilateral inguinal adenopathy as above, indeterminate. Electronically Signed   By: Rise MuBenjamin  McClintock M.D.   On: 07/31/2016 19:03     ASSESSMENT AND PLAN:  Heart Failure with EF 50%, diffuse hypokinesis, grade 1 diastolic dysfunction and mild Luis per  echo. 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 BNP 1493, troponins negative.  Agree with Lasix IV Q 12 hours.  Tachycardia with intermittent short runs of atrial fibrillation per telemetry. Will add beta blocker. Do not think amiodarone is appropriate considering alcohol abuse and effect on liver.   No anticoagulation at present as pt is anemic.   Mild hypertension. Will add ACE-I for cardiomyopathy and hypertension.  Berton BonJanine Frederich Montilla, NP 08/01/2016 5:24 PM

## 2016-08-01 NOTE — Progress Notes (Signed)
  Echocardiogram 2D Echocardiogram has been performed.  Luis SavoyCasey N Lyden [name] 08/01/2016, 2:30 PM

## 2016-08-02 ENCOUNTER — Inpatient Hospital Stay: Payer: Medicare HMO

## 2016-08-02 LAB — CBC
HEMATOCRIT: 23.9 % — AB (ref 40.0–52.0)
HEMOGLOBIN: 7.8 g/dL — AB (ref 13.0–18.0)
MCH: 29.5 pg (ref 26.0–34.0)
MCHC: 32.5 g/dL (ref 32.0–36.0)
MCV: 90.8 fL (ref 80.0–100.0)
Platelets: 437 10*3/uL (ref 150–440)
RBC: 2.63 MIL/uL — ABNORMAL LOW (ref 4.40–5.90)
RDW: 17.9 % — AB (ref 11.5–14.5)
WBC: 8.6 10*3/uL (ref 3.8–10.6)

## 2016-08-02 LAB — BASIC METABOLIC PANEL
ANION GAP: 6 (ref 5–15)
BUN: 25 mg/dL — ABNORMAL HIGH (ref 6–20)
CALCIUM: 8.5 mg/dL — AB (ref 8.9–10.3)
CHLORIDE: 109 mmol/L (ref 101–111)
CO2: 28 mmol/L (ref 22–32)
CREATININE: 0.96 mg/dL (ref 0.61–1.24)
GFR calc non Af Amer: >60 mL/min (ref >60)
Glucose, Bld: 95 mg/dL (ref 65–99)
Potassium: 3.6 mmol/L (ref 3.5–5.1)
SODIUM: 143 mmol/L (ref 135–145)

## 2016-08-02 LAB — PREPARE RBC (CROSSMATCH)

## 2016-08-02 LAB — ABO/RH: ABO/RH(D): A POS

## 2016-08-02 MED ORDER — SODIUM CHLORIDE 0.9 % IV SOLN
Freq: Once | INTRAVENOUS | Status: AC
  Administered 2016-08-02: 15:00:00 via INTRAVENOUS

## 2016-08-02 MED ORDER — DILTIAZEM HCL 25 MG/5ML IV SOLN
10.0000 mg | Freq: Once | INTRAVENOUS | Status: AC
  Administered 2016-08-02: 10 mg via INTRAVENOUS
  Filled 2016-08-02: qty 5

## 2016-08-02 MED ORDER — IPRATROPIUM-ALBUTEROL 0.5-2.5 (3) MG/3ML IN SOLN: 3.0000 mL | RESPIRATORY_TRACT | Status: DC | PRN

## 2016-08-02 MED ORDER — FUROSEMIDE 10 MG/ML IJ SOLN
40.0000 mg | Freq: Three times a day (TID) | INTRAMUSCULAR | Status: DC
  Administered 2016-08-02 – 2016-08-03 (×2): 40 mg via INTRAVENOUS
  Filled 2016-08-02 (×2): qty 4

## 2016-08-02 MED ORDER — IPRATROPIUM-ALBUTEROL 0.5-2.5 (3) MG/3ML IN SOLN
3.0000 mL | RESPIRATORY_TRACT | Status: DC
Start: 2016-08-02 — End: 2016-08-02

## 2016-08-02 MED ORDER — VITAMIN B-1 100 MG PO TABS
100.0000 mg | ORAL_TABLET | Freq: Every day | ORAL | Status: DC
  Administered 2016-08-02 – 2016-08-05 (×4): 100 mg via ORAL
  Filled 2016-08-02 (×4): qty 1

## 2016-08-02 MED ORDER — IPRATROPIUM-ALBUTEROL 0.5-2.5 (3) MG/3ML IN SOLN
3.0000 mL | RESPIRATORY_TRACT | Status: AC
  Administered 2016-08-02: 3 mL via RESPIRATORY_TRACT

## 2016-08-02 MED ORDER — POTASSIUM CHLORIDE CRYS ER 20 MEQ PO TBCR
40.0000 meq | EXTENDED_RELEASE_TABLET | Freq: Every day | ORAL | Status: DC
  Administered 2016-08-02 – 2016-08-03 (×2): 40 meq via ORAL
  Filled 2016-08-02 (×2): qty 2

## 2016-08-02 MED ORDER — METHYLPREDNISOLONE SODIUM SUCC 125 MG IJ SOLR
60.0000 mg | INTRAMUSCULAR | Status: AC
  Administered 2016-08-02: 60 mg via INTRAVENOUS
  Filled 2016-08-02: qty 2

## 2016-08-02 MED ORDER — MOMETASONE FURO-FORMOTEROL FUM 200-5 MCG/ACT IN AERO
2.0000 | INHALATION_SPRAY | Freq: Two times a day (BID) | RESPIRATORY_TRACT | Status: DC
  Administered 2016-08-02 – 2016-08-05 (×7): 2 via RESPIRATORY_TRACT
  Filled 2016-08-02: qty 8.8

## 2016-08-02 NOTE — Progress Notes (Signed)
[facility] [place] [facility]   PATIENT NAME: Luis SatoMichael [name]    MR#:  [id number]  DATE OF BIRTH:  10/19/1950  SUBJECTIVE:  CHIEF COMPLAINT:   Chief Complaint  Patient presents with  . Foot Swelling  . Rash   - Significant wheezing this morning. Lower extremity edema is improving. -Complains of dry skin and itching. -Hemoglobin remains at 7.8  REVIEW OF SYSTEMS:  Review of Systems  Constitutional: Positive for malaise/fatigue. Negative for chills and fever.  HENT: Positive for hearing loss. Negative for ear discharge, ear pain and nosebleeds.   Respiratory: Positive for cough and shortness of breath. Negative for wheezing.   Cardiovascular: Positive for leg swelling. Negative for chest pain and palpitations.  Gastrointestinal: Negative for abdominal pain, constipation, diarrhea, nausea and vomiting.  Genitourinary: Negative for dysuria and urgency.  Musculoskeletal: Negative for myalgias.  Neurological: Negative for dizziness, sensory change, speech change, focal weakness, seizures and headaches.  Psychiatric/Behavioral: Negative for depression.    DRUG ALLERGIES:  No Known Allergies  VITALS:  Blood pressure (!) 155/89, pulse 88, temperature 97.9 F (36.6 C), temperature source Oral, resp. rate 18, height 6' (1.829 m), weight 73.3 kg (161 lb 8 oz), SpO2 94 %.  PHYSICAL EXAMINATION:  Physical Exam  GENERAL:  65 y.o.-year-old patient lying in the bed with no acute distress.  EYES: Pupils equal, round, reactive to light and accommodation. No scleral icterus. Extraocular muscles intact.  HEENT: Head atraumatic, normocephalic. Oropharynx and nasopharynx clear.  NECK:  Supple, no jugular venous distention. No thyroid enlargement, no tenderness.  LUNGS: Normal breath sounds bilaterally, no wheezing, rales,rhonchi or crepitation. Occasional coarse rhonchi at the bases noted. No use of accessory muscles of respiration.  CARDIOVASCULAR: S1, S2 normal.  Norubs, or gallops. 2/6 systolic murmur is present ABDOMEN: Soft, nontender, nondistended. Bowel sounds present. No organomegaly or mass.  EXTREMITIES: No  cyanosis, or clubbing. 2+ lower extremity edema noted NEUROLOGIC: Cranial nerves II through XII are intact. Muscle strength 5/5 in all extremities. Sensation intact. Gait not checked.  PSYCHIATRIC: The patient is alert and oriented x 3.  SKIN: Improved rash on the arms and also upper back. Macular rash. Scratch marks and scabs from increased scratching noted. Significant dry skin noted   LABORATORY PANEL:   CBC  Recent Labs Lab 08/02/16 0418  WBC 8.6  HGB 7.8*  HCT 23.9*  PLT 437   ------------------------------------------------------------------------------------------------------------------  Chemistries   Recent Labs Lab 07/31/16 1733  08/02/16 0418  NA 141  < > 143  K 4.0  < > 3.6  CL 110  < > 109  CO2 24  < > 28  GLUCOSE 97  < > 95  BUN 17  < > 25*  CREATININE 0.97  < > 0.96  CALCIUM 8.4*  < > 8.5*  AST 40  --   --   ALT 22  --   --   ALKPHOS 124  --   --   BILITOT <0.1*  --   --   < > = values in this interval not displayed. ------------------------------------------------------------------------------------------------------------------  Cardiac Enzymes  Recent Labs Lab 08/01/16 0519  TROPONINI <0.03   ------------------------------------------------------------------------------------------------------------------  RADIOLOGY:  Dg Chest 2 View  Result Date: 07/31/2016 CLINICAL DATA:  Initial evaluation for lower extremity swelling with shortness of breath. EXAM: CHEST  2 VIEW COMPARISON:  Prior radiograph from 07/14/2016. FINDINGS: Accentuation of the cardiac silhouette related to AP technique and shallow lung inflation. Mediastinal silhouette within normal limits. Diffuse vascular  congestion with interstitial prominence, suggesting pulmonary interstitial edema. Superimposed bibasilar atelectasis.  Small bilateral pleural effusions. No focal infiltrates. No pneumothorax. No acute osseous abnormality. IMPRESSION: 1. Diffusely increased vascular congestion with interstitial prominence, consistent with pulmonary interstitial edema. 2. Small bilateral pleural effusions with bibasilar atelectasis. Electronically Signed   By: Rise MuBenjamin  McClintock M.D.   On: 07/31/2016 18:21   [place] Venous Img Lower Bilateral  Result Date: 07/31/2016 EXAM: BILATERAL LOWER EXTREMITY VENOUS DOPPLER ULTRASOUND TECHNIQUE: Gray-scale sonography with graded compression, as well as color Doppler and duplex ultrasound were performed to evaluate the lower extremity deep venous systems from the level of the common femoral vein and including the common femoral, femoral, profunda femoral, popliteal and calf veins including the posterior tibial, peroneal and gastrocnemius veins when visible. The superficial great saphenous vein was also interrogated. Spectral Doppler was utilized to evaluate flow at rest and with distal augmentation maneuvers in the common femoral, femoral and popliteal veins. COMPARISON:  None. FINDINGS: RIGHT LOWER EXTREMITY Common Femoral Vein: No evidence of thrombus. Normal compressibility, respiratory phasicity and response to augmentation. Saphenofemoral Junction: No evidence of thrombus. Normal compressibility and flow on color Doppler imaging. Profunda Femoral Vein: No evidence of thrombus. Normal compressibility and flow on color Doppler imaging. Femoral Vein: No evidence of thrombus. Normal compressibility, respiratory phasicity and response to augmentation. Popliteal Vein: No evidence of thrombus. Normal compressibility, respiratory phasicity and response to augmentation. Calf Veins: No evidence of thrombus. Normal compressibility and flow on color Doppler imaging. Superficial Great Saphenous Vein: No evidence of thrombus. Normal compressibility and flow on color Doppler imaging. Venous Reflux:  None. Other Findings:  Enlarged right inguinal node measures 3.3 x 1.0 x 2.6 cm. Pitting edema. LEFT LOWER EXTREMITY the Common Femoral Vein: No evidence of thrombus. Normal compressibility, respiratory phasicity and response to augmentation. Saphenofemoral Junction: No evidence of thrombus. Normal compressibility and flow on color Doppler imaging. Profunda Femoral Vein: No evidence of thrombus. Normal compressibility and flow on color Doppler imaging. Femoral Vein: No evidence of thrombus. Normal compressibility, respiratory phasicity and response to augmentation. Popliteal Vein: No evidence of thrombus. Normal compressibility, respiratory phasicity and response to augmentation. Calf Veins: No evidence of thrombus. Normal compressibility and flow on color Doppler imaging. Superficial Great Saphenous Vein: No evidence of thrombus. Normal compressibility and flow on color Doppler imaging. Venous Reflux:  None. Other Findings: Enlarged left inguinal node measures 3.4 x 0.9 x 1.7 cm. Pitting edema. IMPRESSION: 1. No evidence of deep venous thrombosis. 2. Enlarged bilateral inguinal adenopathy as above, indeterminate. Electronically Signed   By: Rise MuBenjamin  McClintock M.D.   On: 07/31/2016 19:03    EKG:   Orders placed or performed during the hospital encounter of 07/31/16  . ED EKG  . ED EKG  . ED EKG  . ED EKG    ASSESSMENT AND PLAN:   65 year old male with past medical history significant for alcohol abuse, GERD presents to hospital secondary to worsening shortness of breath and also lower extremity edema.  #1 acute CHF-cardiomyopathy and pulmonary vascular congestion noted on chest x-ray. 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  -Appreciate cardiology consult. Follow-up chest x-ray today. -Tachycardia with arrhythmias noted to occasionally on the monitor. Metoprolol added.  #2 acute on chronic anemia- hemoglobin  dropped from 12 to 9 in June 2017 and patient had an EGD done by GI at the time that showed duodenitis and nonbleeding ulcers. -No variceal bleed noted at  the time. He has a colonoscopy done in 2014 the results of which are not available but patient states he was reported to be normal -Iron levels are low. Started on IV iron. Due to cardiac symptoms, one unit packed RBC transfusion ordered today. No active bleeding noted today.  #3 skin rash- dry skin, increased scratch marks Improving with Eucerin cream and prednisone  #4 tobacco use disorder-nicotine patch  #5 duodenal ulcer and duodenitis-continue Protonix. Discontinue naproxen that patient was taking  #6 DVT prophylaxis-discontinue Lovenox due to anemia. -Do Ted's and SCDs  Anticipate discharge in the next 1-2 days. Physical therapy consulted.   All the records are reviewed and case discussed with Care Management/Social Workerr. Management plans discussed with the patient, family and they are in agreement.  CODE STATUS: Full code  TOTAL TIME TAKING CARE OF THIS PATIENT: 37 minutes.   POSSIBLE D/C IN 1-2 DAYS, DEPENDING ON CLINICAL CONDITION.   Enid Baas M.D on 08/02/2016 at 9:47 AM  Between 7am to 6pm - Pager - [phone]  After 6pm go to [email or web address] - Social research officer, government  [facility]  Office  [phone]  CC: Primary care physician; No PCP Per Patient

## 2016-08-02 NOTE — Progress Notes (Signed)
Pt received I unit Prbcs this shift, no reaction noted, patient tolerated without complaints. I will continue to assess.

## 2016-08-02 NOTE — Progress Notes (Signed)
Ambulated well with physical therapy today. However patient's sister has been concerned about patient's falls at home. 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 Check CT head and start oral thiamine. -Has significant history of alcohol abuse

## 2016-08-02 NOTE — Progress Notes (Signed)
Sent a phone call from administration from patient's sister Sofie Hartiganina Tollison. She wanted the staff to be aware of patient having falls where he hit his head. She has noticed a change in his mental status. Notified Dr. Nemiah CommanderKalisetti of sister's concerns.

## 2016-08-02 NOTE — Progress Notes (Signed)
SUBJECTIVE: Patient is still short of breath   Vitals:   08/01/16 1753 08/01/16 2006 08/02/16 0536 08/02/16 0801  BP:  137/74 (!) 155/89   Pulse: (!) 105 87 88   Resp:  (!) 22 18   Temp:  98 F (36.7 C) 97.9 F (36.6 C)   TempSrc:  Oral Oral   SpO2:  95% 94% 94%  Weight:      Height:        Intake/Output Summary (Last 24 hours) at 08/02/16 1057 Last data filed at 08/02/16 1012  Gross per 24 hour  Intake             1123 ml  Output             2925 ml  Net            -1802 ml    LABS: Basic Metabolic Panel:  Recent Labs  45/40/9810/03/12 0519 08/02/16 0418  NA 141 143  K 4.1 3.6  CL 110 109  CO2 24 28  GLUCOSE 157* 95  BUN 18 25*  CREATININE 0.99 0.96  CALCIUM 8.2* 8.5*   Liver Function Tests:  Recent Labs  07/31/16 1733  AST 40  ALT 22  ALKPHOS 124  BILITOT <0.1*  PROT 6.7  ALBUMIN 3.0*   No results for input(s): LIPASE, AMYLASE in the last 72 hours. CBC:  Recent Labs  07/31/16 1733 08/01/16 0519 08/02/16 0418  WBC 10.1 6.5 8.6  NEUTROABS 6.9*  --   --   HGB 9.0* 7.6* 7.8*  HCT 26.7* 22.2* 23.9*  MCV 90.8 88.4 90.8  PLT 478* 406 437   Cardiac Enzymes:  Recent Labs  07/31/16 1733 07/31/16 2355 08/01/16 0519  TROPONINI <0.03 <0.03 <0.03   BNP: Invalid input(s): POCBNP D-Dimer: No results for input(s): DDIMER in the last 72 hours. Hemoglobin A1C: No results for input(s): HGBA1C in the last 72 hours. Fasting Lipid Panel: No results for input(s): CHOL, HDL, LDLCALC, TRIG, CHOLHDL, LDLDIRECT in the last 72 hours. Thyroid Function Tests:  Recent Labs  07/31/16 2355  TSH 3.364   Anemia Panel:  Recent Labs  07/31/16 1733  VITAMINB12 286  FOLATE 11.7  FERRITIN 23*  TIBC 368  IRON 18*     PHYSICAL EXAM General: Well developed, well nourished, in no acute distress HEENT:  Normocephalic and atramatic Neck:  No JVD.  Lungs: Clear bilaterally to auscultation and percussion. Heart: HRRR . Normal S1 and S2 without gallops or  murmurs.  Abdomen: Bowel sounds are positive, abdomen soft and non-tender  Msk:  Back normal, normal gait. Normal strength and tone for age. Extremities: No clubbing, cyanosis or edema.   Neuro: Alert and oriented X 3. 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 Agree with enalapril and metoprolol along with lasix. Advise cessation of alcohol use and rehab. Active Problems:   Acute CHF (HCC)   Atrial fibrillation (HCC)    Adrian BlackwaterKHAN,Mry Lamia A, MD, [facility] 08/02/2016 10:57 AM

## 2016-08-02 NOTE — Evaluation (Signed)
Physical Therapy Evaluation Patient Details Name: Leanna SatoMichael Rance MRN: [id number] DOB: 04/30/1951 Today's Date: 08/02/2016   History of Present Illness  Pt is a 65 y/o M who presents from his group home with a 2 wk h/o lower leg swelling and SOB with exertion.  Admitting dx: acute CHF.  Pt's PMH includes alcohol abuse.    Clinical Impression  Pt admitted with above diagnosis. Pt currently with functional limitations due to the deficits listed below (see PT Problem List). [name] is from a group home where he does cooking, cleaning.  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  BUE/LE strength is grossly WFL and pt scored a 53/56 on the Berg Balance Scale indicating that the pt has a low risk for falling. He would benefit from OPPT with focus on high level balance exercises and endurance training. Pt will benefit from skilled PT to increase their independence and safety with mobility to allow discharge to the venue listed below.      Follow Up Recommendations Outpatient PT    Equipment Recommendations  None recommended by PT    Recommendations for Other Services       Precautions / Restrictions Precautions Precautions: Fall Restrictions Weight Bearing Restrictions: No      Mobility  Bed Mobility Overal bed mobility: Independent             General bed mobility comments: No physical assist or cues needed  Transfers Overall transfer level: Independent Equipment used: None             General transfer comment: No cues or physical assist needed. No instability noted.  Ambulation/Gait Ambulation/Gait assistance: Modified independent (Device/Increase time) Ambulation Distance (Feet): 300 Feet Assistive device: None Gait Pattern/deviations: Step-through pattern;Drifts right/left   Gait velocity interpretation: at or above normal speed for age/gender General Gait Details: Very mildly unsteady drifting L to R at times but no LOB and no  physical assist needed.  SpO2 remains at or above 90% on RA.    Stairs            Wheelchair Mobility    Modified Rankin (Stroke Patients Only)       Balance Overall balance assessment: Needs assistance Sitting-balance support: No upper extremity supported;Feet unsupported Sitting balance-Leahy Scale: Normal     Standing balance support: No upper extremity supported;During functional activity Standing balance-Leahy Scale: Good               High level balance activites: Backward walking;Direction changes;Turns;Sudden stops;Head turns High Level Balance Comments: No instability with high level balance activities  Standardized Balance Assessment Standardized Balance Assessment : Berg Balance Test Berg Balance Test Sit to Stand: Able to stand without using hands and stabilize independently Standing Unsupported: Able to stand safely 2 minutes Sitting with Back Unsupported but Feet Supported on Floor or Stool: Able to sit safely and securely 2 minutes Stand to Sit: Sits safely with minimal use of hands Transfers: Able to transfer safely, minor use of hands Standing Unsupported with Eyes Closed: Able to stand 10 seconds safely Standing Ubsupported with Feet Together: Able to place feet together independently and stand 1 minute safely From Standing, Reach Forward with Outstretched Arm: Can reach confidently >25 cm (10") From Standing Position, Pick up Object from Floor: Able to pick up shoe safely and easily From Standing Position, Turn to Look Behind Over each Shoulder: Looks behind from both sides and weight shifts well Turn 360 Degrees: Able to turn 360 degrees  safely in 4 seconds or less Standing Unsupported, Alternately Place Feet on Step/Stool: Able to stand independently and safely and complete 8 steps in 20 seconds Standing Unsupported, One Foot in Front: Able to plae foot ahead of the other independently and hold 30 seconds Standing on One Leg: Able to lift leg  independently and hold equal to or more than 3 seconds Total Score: 53         Pertinent Vitals/Pain Pain Assessment: 0-10 Pain Score: 5  Pain Location: Bil LEs due to swelling Pain Descriptors / Indicators: Aching;Discomfort Pain Intervention(s): Limited activity within patient's tolerance;Monitored during session    Home Living Family/patient expects to be discharged to:: Group home Living Arrangements: Group Home Available Help at Discharge: Other (Comment) ("if you can't do it yourself you're in trouble") Type of Home: Group Home Home Access: Ramped entrance     Home Layout: One level Home Equipment: None Additional Comments: The Group Home is in an old schoolhouse    Prior Function Level of Independence: Independent         Comments: Denies any falls over the past 6 months.  All residents do the cooking, cleaning.      Hand Dominance   Dominant Hand: Right    Extremity/Trunk Assessment   Upper Extremity Assessment: Overall WFL for tasks assessed           Lower Extremity Assessment: Overall WFL for tasks assessed      Cervical / Trunk Assessment: Normal  Communication   Communication: No difficulties  Cognition Arousal/Alertness: Awake/alert Behavior During Therapy: WFL for tasks assessed/performed Overall Cognitive Status: Within Functional Limits for tasks assessed                      General Comments General comments (skin integrity, edema, etc.): Pt scored a 53/56 on the Berg Balance Scale indicating that the pt has a low risk for falling.    Exercises Other Exercises Other Exercises: Squats x15 with UEs supported on counter Other Exercises: SLS 2x30 seconds on each LE with two fingers supported on counter Other Exercises: Marching in place x20 each side   Assessment/Plan    PT Assessment Patient needs continued PT services  PT Problem List Decreased strength;Decreased balance          PT Treatment Interventions Therapeutic  exercise;Balance training;Patient/family education    PT Goals (Current goals can be found in the Care Plan section)  Acute Rehab PT Goals Patient Stated Goal: to go back to the group home PT Goal Formulation: With patient Time For Goal Achievement: 08/09/16 Potential to Achieve Goals: Good    Frequency Min 2X/week   Barriers to discharge        Co-evaluation               End of Session Equipment Utilized During Treatment: Gait belt Activity Tolerance: Patient tolerated treatment well Patient left: in bed;with call bell/phone within reach;Other (comment) (sitting EOB) Nurse Communication: Mobility status         Time: [id number] PT Time Calculation (min) (ACUTE ONLY): 29 min   Charges:   PT Evaluation $PT Eval Low Complexity: 1 Procedure PT Treatments $Therapeutic Exercise: 8-22 mins   PT G Codes:       Encarnacion ChuAshley Abashian PT, DPT 08/02/2016, 10:22 AM

## 2016-08-02 NOTE — Clinical Social Work Note (Signed)
MS[facility] at [phone] to update them on patient's progress.  MSW spoke to Luis HeysWilliam [name] [phone] and updated him that patient was seen by PT, they are recommending outpatient PT.  [facility] said he will talk with his colleague to discuss patient's situation and make sure they can take him back.  MSW continuing to follow patient's progress throughout discharge planning.  Luis KnackEric R. Sylver [name], MSW [phone]  Mon-Fri 8a-4:30p 08/02/2016 11:42 AM

## 2016-08-02 NOTE — Care Management (Signed)
[facility] has [facility] a [facility].  He has history of alcohol abuse.  Feels he is doing "much better."  Patient was admitted with congestive heart failure due to cardiomyopathy related to alcohol use. This  is a new diagnosis for patient.  At present, the [facility] has informed csw that patient can return but they are reluctantly doing so stating that they can not manage patient's medical condition.  Discussed home health nursing services and social work with [place] with [facility].  Patient does not have a pcp. He would also benefit from heart failure clinic but unsure how patient would navigate the appointments. Patient continues on IV lasix.  He does not have functional deficits that would require home health therapy

## 2016-08-02 NOTE — Evaluation (Signed)
Occupational Therapy Evaluation Patient Details Name: Luis [name] MRN: [id number] DOB: 11/11/1950 Today's Date: 08/02/2016    History of Present Illness Pt is a 65 y/o M who presents from his group home with a 2 wk h/o lower leg swelling and SOB with exertion.  Admitting dx: acute CHF.  Pt's PMH includes alcohol abuse.   Clinical Impression   Patient is a 65 yo male admitted for acute CHF and evaluated this date for Occupational Therapy.  [facility] a [facility] setting [facility], works full time at [employer] 5-6 days a week.  He was previously independent with all basic self care and IADL tasks prior to admission.  Patient is able to complete basic self care tasks on evaluation however, he demonstrates shortness of breath with exertion when performing activities.  He has good strength, limited endurance and edema/pain in his bilateral LEs.  He would benefit from skilled OT to further instruct patient on energy conservation techniques for basic self care tasks, IADL tasks for homemaking skills required to stay at the Group home and to increase his safety and independence in daily performance.      Follow Up Recommendations  No OT follow up    Equipment Recommendations       Recommendations for Other Services       Precautions / Restrictions Precautions Precautions: Fall Restrictions Weight Bearing Restrictions: No      Mobility Bed Mobility Overal bed mobility: Independent             General bed mobility comments: No physical assist or cues needed  Transfers Overall transfer level: Independent Equipment used: None             General transfer comment: No cues or physical assist needed. No instability noted.    Balance Overall balance assessment: Needs assistance Sitting-balance support: No upper extremity supported;Feet unsupported Sitting balance-Leahy Scale: Normal     Standing balance support: No upper extremity supported;During  functional activity Standing balance-Leahy Scale: Good              PER PT: High level balance activites: Backward walking;Direction changes;Turns;Sudden stops;Head turns High Level Balance Comments: No instability with high level balance activities  Standardized Balance Assessment Standardized Balance Assessment : Berg Balance Test Berg Balance Test Sit to Stand: Able to stand without using hands and stabilize independently Standing Unsupported: Able to stand safely 2 minutes Sitting with Back Unsupported but Feet Supported on Floor or Stool: Able to sit safely and securely 2 minutes Stand to Sit: Sits safely with minimal use of hands Transfers: Able to transfer safely, minor use of hands Standing Unsupported with Eyes Closed: Able to stand 10 seconds safely Standing Ubsupported with Feet Together: Able to place feet together independently and stand 1 minute safely From Standing, Reach Forward with Outstretched Arm: Can reach confidently >25 cm (10") From Standing Position, Pick up Object from Floor: Able to pick up shoe safely and easily From Standing Position, Turn to Look Behind Over each Shoulder: Looks behind from both sides and weight shifts well Turn 360 Degrees: Able to turn 360 degrees safely in 4 seconds or less Standing Unsupported, Alternately Place Feet on Step/Stool: Able to stand independently and safely and complete 8 steps in 20 seconds Standing Unsupported, One Foot in Front: Able to plae foot ahead of the other independently and hold 30 seconds Standing on One Leg: Able to lift leg independently and hold equal to or more than 3 seconds Total  Score: 53        ADL Overall ADL's : Needs assistance/impaired Eating/Feeding: Independent   Grooming: Modified independent Grooming Details (indicate cue type and reason): rest breaks as needed.  Upper Body Bathing: Modified independent   Lower Body Bathing: Modified independent   Upper Body Dressing : Modified  independent   Lower Body Dressing: Modified independent   Toilet Transfer: Modified Independent   Toileting- Clothing Manipulation and Hygiene: Modified independent       Functional mobility during ADLs: Modified independent General ADL Comments: Patient with shortness of breath on exertion from self care tasks.  Patient able to complete basic self care however requires rest breaks and would likely benefit from information on energy conservation techniques and/or adaptive equipment.       Vision     Perception     Praxis      Pertinent Vitals/Pain Pain Assessment: 0-10 Pain Score: 3  Pain Location: LEs with edema Pain Descriptors / Indicators: Aching Pain Intervention(s): Limited activity within patient's tolerance;Repositioned     Hand Dominance Right   Extremity/Trunk Assessment Upper Extremity Assessment Upper Extremity Assessment: Overall WFL for tasks assessed   Lower Extremity Assessment Lower Extremity Assessment: Overall WFL for tasks assessed   Cervical / Trunk Assessment Cervical / Trunk Assessment: Normal   Communication Communication Communication: No difficulties   Cognition Arousal/Alertness: Awake/alert Behavior During Therapy: WFL for tasks assessed/performed Overall Cognitive Status: Within Functional Limits for tasks assessed                     General Comments       Exercises Exercises: Other exercises Other Exercises Other Exercises: Squats x15 with UEs supported on counter Other Exercises: SLS 2x30 seconds on each LE with two fingers supported on counter Other Exercises: Marching in place x20 each side   Shoulder Instructions      Home Living Family/patient expects to be discharged to:: Group home Living Arrangements: Group Home Available Help at Discharge: Other (Comment) Type of Home: Group Home Home Access: Ramped entrance     Home Layout: One level     Bathroom Shower/Tub: Walk-in shower;Curtain   Bathroom  Toilet: Handicapped height     Home Equipment: None   Additional Comments: The Group Home is in an old schoolhouse      Prior Functioning/Environment Level of Independence: Independent        Comments: Denies any falls over the past 6 months.  All residents do the cooking, cleaning.         OT Problem List: Decreased activity tolerance;Decreased knowledge of use of DME or AE;Pain   OT Treatment/Interventions: DME and/or AE instruction;Self-care/ADL training;Energy conservation;Patient/family education    OT Goals(Current goals can be found in the care plan section) Acute Rehab OT Goals Patient Stated Goal: to go back to the group home OT Goal Formulation: With patient Time For Goal Achievement: 08/13/16 Potential to Achieve Goals: Good  OT Frequency: Min 1X/week   Barriers to D/C:            Co-evaluation              End of Session    Activity Tolerance: Patient limited by fatigue Patient left: in bed;with call bell/phone within reach;with family/visitor present (care management in to see patient)   Time: 2130-8657 OT Time Calculation (min): 20 min Charges:  OT General Charges $OT Visit: 1 Procedure OT Evaluation $OT Eval Low Complexity: 1 Procedure G-Codes:    Krystalynn Ridgeway  Ashanti Ratti T Arne ClevelandLovett, OTR/L, CLT  08/02/2016, 12:02 PM

## 2016-08-03 LAB — CBC
HCT: 27.3 % — ABNORMAL LOW (ref 40.0–52.0)
Hemoglobin: 9 g/dL — ABNORMAL LOW (ref 13.0–18.0)
MCH: 29.8 pg (ref 26.0–34.0)
MCHC: 33.2 g/dL (ref 32.0–36.0)
MCV: 89.7 fL (ref 80.0–100.0)
PLATELETS: 448 10*3/uL — AB (ref 150–440)
RBC: 3.04 MIL/uL — AB (ref 4.40–5.90)
RDW: 17.1 % — AB (ref 11.5–14.5)
WBC: 11 10*3/uL — AB (ref 3.8–10.6)

## 2016-08-03 LAB — BASIC METABOLIC PANEL
ANION GAP: 10 (ref 5–15)
BUN: 28 mg/dL — ABNORMAL HIGH (ref 6–20)
CALCIUM: 8.6 mg/dL — AB (ref 8.9–10.3)
CO2: 29 mmol/L (ref 22–32)
Chloride: 104 mmol/L (ref 101–111)
Creatinine, Ser: 1.07 mg/dL (ref 0.61–1.24)
Glucose, Bld: 127 mg/dL — ABNORMAL HIGH (ref 65–99)
POTASSIUM: 3.5 mmol/L (ref 3.5–5.1)
Sodium: 143 mmol/L (ref 135–145)

## 2016-08-03 LAB — MAGNESIUM: MAGNESIUM: 1.4 mg/dL — AB (ref 1.7–2.4)

## 2016-08-03 LAB — VITAMIN B1: VITAMIN B1 (THIAMINE): 95.5 nmol/L (ref 66.5–200.0)

## 2016-08-03 MED ORDER — DILTIAZEM HCL 25 MG/5ML IV SOLN
10.0000 mg | Freq: Once | INTRAVENOUS | Status: AC
  Administered 2016-08-03: 10 mg via INTRAVENOUS
  Filled 2016-08-03: qty 5

## 2016-08-03 MED ORDER — MAGNESIUM SULFATE 4 GM/100ML IV SOLN
4.0000 g | Freq: Once | INTRAVENOUS | Status: AC
  Administered 2016-08-03: 4 g via INTRAVENOUS
  Filled 2016-08-03: qty 100

## 2016-08-03 MED ORDER — FUROSEMIDE 40 MG PO TABS
40.0000 mg | ORAL_TABLET | Freq: Two times a day (BID) | ORAL | Status: DC
  Administered 2016-08-03 – 2016-08-05 (×4): 40 mg via ORAL
  Filled 2016-08-03 (×5): qty 1

## 2016-08-03 MED ORDER — METOPROLOL TARTRATE 100 MG PO TABS
100.0000 mg | ORAL_TABLET | Freq: Two times a day (BID) | ORAL | Status: DC
  Administered 2016-08-03 – 2016-08-05 (×5): 100 mg via ORAL
  Filled 2016-08-03 (×5): qty 1

## 2016-08-03 MED ORDER — DILTIAZEM HCL 60 MG PO TABS
60.0000 mg | ORAL_TABLET | Freq: Once | ORAL | Status: AC
  Administered 2016-08-04: 60 mg via ORAL
  Filled 2016-08-03: qty 1

## 2016-08-03 NOTE — Progress Notes (Signed)
Initial Heart Failure Clinic appointment scheduled on August 25, 2016 at 10:00am. Thank you.

## 2016-08-03 NOTE — Progress Notes (Signed)
Episodes of Atrial fib with RVR noted this shift.  Dr Anne HahnWillis made aware.  IV Cardizem IV push given X 2 occurences.  Converted to NSR at 0400.  Pt  without complaints of palpitations or shortness of breath.

## 2016-08-03 NOTE — Progress Notes (Signed)
[facility] - [place] [facility]   PATIENT NAME: Luis SatoMichael [name]    MR#:  [id number]  DATE OF BIRTH:  04/20/1951  SUBJECTIVE:  CHIEF COMPLAINT:   Chief Complaint  Patient presents with  . Foot Swelling  . Rash   - feels tired, Received 1 unit packed RBC transfusion yesterday. Hemoglobin has improved appropriately. -Family worried about ataxia, CT of the head with no acute findings but showing moderate cerebral atrophy. Patient has had chronic alcohol abuse history  REVIEW OF SYSTEMS:  Review of Systems  Constitutional: Positive for malaise/fatigue. Negative for chills and fever.  HENT: Positive for hearing loss. Negative for ear discharge, ear pain and nosebleeds.   Respiratory: Positive for cough and shortness of breath. Negative for wheezing.   Cardiovascular: Positive for leg swelling. Negative for chest pain and palpitations.  Gastrointestinal: Negative for abdominal pain, constipation, diarrhea, nausea and vomiting.  Genitourinary: Negative for dysuria and urgency.  Musculoskeletal: Positive for falls. Negative for myalgias.  Neurological: Negative for dizziness, sensory change, speech change, focal weakness, seizures and headaches.  Psychiatric/Behavioral: Negative for depression.    DRUG ALLERGIES:  No Known Allergies  VITALS:  Blood pressure (!) 141/100, pulse 85, temperature 98.2 F (36.8 C), temperature source Oral, resp. rate 18, height 6' (1.829 m), weight 73.3 kg (161 lb 8 oz), SpO2 94 %.  PHYSICAL EXAMINATION:  Physical Exam  GENERAL:  65 y.o.-year-old patient lying in the bed with no acute distress.  EYES: Pupils equal, round, reactive to light and accommodation. No scleral icterus. Extraocular muscles intact.  HEENT: Head atraumatic, normocephalic. Oropharynx and nasopharynx clear.  NECK:  Supple, no jugular venous distention. No thyroid enlargement, no tenderness.  LUNGS: Normal breath sounds bilaterally, no wheezing, rales,rhonchi or  crepitation. Occasional coarse rhonchi at the bases noted. No use of accessory muscles of respiration.  CARDIOVASCULAR: S1, S2 normal. Norubs, or gallops. 2/6 systolic murmur is present ABDOMEN: Soft, nontender, nondistended. Bowel sounds present. No organomegaly or mass.  EXTREMITIES: No  cyanosis, or clubbing. 2+ lower extremity edema noted NEUROLOGIC: Cranial nerves II through XII are intact. Muscle strength 5/5 in all extremities. Sensation intact. Gait not checked.  PSYCHIATRIC: The patient is alert and oriented x 3.  SKIN: Improved rash on the arms and also upper back. Macular rash. Scratch marks and scabs from increased scratching noted. Significant dry skin noted   LABORATORY PANEL:   CBC  Recent Labs Lab 08/03/16 0418  WBC 11.0*  HGB 9.0*  HCT 27.3*  PLT 448*   ------------------------------------------------------------------------------------------------------------------  Chemistries   Recent Labs Lab 07/31/16 1733  08/03/16 0418  NA 141  < > 143  K 4.0  < > 3.5  CL 110  < > 104  CO2 24  < > 29  GLUCOSE 97  < > 127*  BUN 17  < > 28*  CREATININE 0.97  < > 1.07  CALCIUM 8.4*  < > 8.6*  MG  --   --  1.4*  AST 40  --   --   ALT 22  --   --   ALKPHOS 124  --   --   BILITOT <0.1*  --   --   < > = values in this interval not displayed. ------------------------------------------------------------------------------------------------------------------  Cardiac Enzymes  Recent Labs Lab 08/01/16 0519  TROPONINI <0.03   ------------------------------------------------------------------------------------------------------------------  RADIOLOGY:  Dg Chest 2 View  Result Date: 08/02/2016 CLINICAL DATA:  Wheezing and lower extremity edema.  CHF. EXAM: CHEST  2 VIEW  COMPARISON:  06/30/2016 FINDINGS: Normal heart size and mediastinal contours. There is diffuse interstitial opacity with Kerley lines and small bilateral pleural effusion that are layering. No air  bronchogram or pneumothorax. IMPRESSION: CHF.  Degree is unchanged from 2 days ago. Electronically Signed   By: Marnee Spring M.D.   On: 08/02/2016 10:34   Ct Head Wo Contrast  Result Date: 08/02/2016 CLINICAL DATA:  Falls. Decreased cognition lately. History of alcohol abuse. EXAM: CT HEAD WITHOUT CONTRAST TECHNIQUE: Contiguous axial images were obtained from the base of the skull through the vertex without intravenous contrast. COMPARISON:  03/03/2016 FINDINGS: Brain: Mild-to-moderate cerebral atrophy is unchanged. A chronic lacunar infarct is again seen in the right corona radiata. There is no evidence of acute cortical infarct, intracranial hemorrhage, mass, midline shift, or extra-axial fluid collection. Vascular: Calcified atherosclerosis at the skullbase. No hyperdense vessel. Skull: No fracture or focal osseous lesion. Sinuses/Orbits: Visualized paranasal sinuses are clear. Small right mastoid effusion, decreased from prior. Visualized orbits are unremarkable. Other: None. IMPRESSION: 1. No evidence of acute intracranial abnormality. 2. Unchanged cerebral atrophy and chronic right corona radiata lacunar infarct. Electronically Signed   By: Sebastian Ache M.D.   On: 08/02/2016 16:17    EKG:   Orders placed or performed during the hospital encounter of 07/31/16  . ED EKG  . ED EKG  . ED EKG  . ED EKG    ASSESSMENT AND PLAN:   65 year old male with past medical history significant for alcohol abuse, GERD presents to hospital secondary to worsening shortness of breath and also lower extremity edema.  #1 acute CHF-cardiomyopathy and pulmonary vascular congestion noted on chest x-ray. 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 Change to by mouth Lasix -Appreciate cardiology consult. -Tachycardia with arrhythmias noted to occasionally on the monitor. Metoprolol added. -Converted to  atrial fibrillation with rapid ventricular response last night requiring 2 IV Cardizem pushes. In normal sinus rhythm this morning. Not a candidate for anticoagulation due to GI ulcers and bleeding in the past and anemia. -We'll start enteric-coated aspirin at discharge. -Metoprolol dose has been increased by cardiology team  #2 acute on chronic anemia- hemoglobin dropped from 12 to 9 in June 2017 and patient had an EGD done by GI at the time that showed duodenitis and nonbleeding ulcers. -No variceal bleed noted at the time. He has a colonoscopy done in 2014 the results of which are not available but patient states he was reported to be normal -Iron levels are low. Received 3 doses of IV iron. -Due to cardiac symptoms, one unit packed RBC transfusion received and hemoglobin increased appropriately. No active bleeding noted.  #3 skin rash- dry skin, increased scratch marks Improving with Eucerin cream and prednisone-stop at discharge. -On Benadryl when necessary  #4 tobacco use disorder-nicotine patch Has underlying COPD. Added inhalers and nebulizers as needed  #5 duodenal ulcer and duodenitis-continue Protonix. Discontinue naproxen that patient was taking  #6 DVT prophylaxis-discontinue Lovenox due to anemia. -Do Ted's and SCDs  Anticipate discharge tomorrow. Physical therapy consulted. Recommended home health. Will be discharged back to group home.   All the records are reviewed and case discussed with Care Management/Social Workerr. Management plans discussed with the patient, family and they are in agreement.  CODE STATUS: Full code  TOTAL TIME TAKING CARE OF THIS PATIENT: 37 minutes.   POSSIBLE D/C TOMORROW, DEPENDING ON CLINICAL CONDITION.   Enid Baas M.D on 08/03/2016 at 2:59  PM  Between 7am to 6pm - Pager - [phone]  After 6pm go to [email or web address] - Social research officer, governmentpassword EPAS [facility]  [facility]  Office  [phone]  CC: Primary care physician; No PCP  Per Patient

## 2016-08-03 NOTE — Progress Notes (Signed)
MEDICATION RELATED CONSULT NOTE - INITIAL   Pharmacy Consult for Electrolyte Monitoring  Indication: Hypomagnesemia  No Known Allergies  Patient Measurements: Height: 6' (182.9 cm) Weight: 161 lb 8 oz (73.3 kg) IBW/kg (Calculated) : 77.6  Vital Signs: Temp: 98.2 F (36.8 C) (11/08 1143) Temp Source: Oral (11/08 1143) BP: 141/100 (11/08 1143) Pulse Rate: 85 (11/08 1143) Intake/Output from previous day: 11/07 0701 - 11/08 0700 In: 1880 [P.O.:1120; Blood:600; IV Piggyback:160] Out: 3675 [Urine:3675] Intake/Output from this shift: Total I/O In: 600 [P.O.:600] Out: 1850 [Urine:1850]  Labs:  Recent Labs  07/31/16 1733 08/01/16 0519 08/02/16 0418 08/03/16 0418  WBC 10.1 6.5 8.6 11.0*  HGB 9.0* 7.6* 7.8* 9.0*  HCT 26.7* 22.2* 23.9* 27.3*  PLT 478* 406 437 448*  CREATININE 0.97 0.99 0.96 1.07  MG  --   --   --  1.4*  ALBUMIN 3.0*  --   --   --   PROT 6.7  --   --   --   AST 40  --   --   --   ALT 22  --   --   --   ALKPHOS 124  --   --   --   BILITOT <0.1*  --   --   --    Estimated Creatinine Clearance: 70.4 mL/min (by C-G formula based on SCr of 1.07 mg/dL).   Microbiology: No results found for this or any previous visit (from the past 720 hour(s)).  Medical History: Past Medical History:  Diagnosis Date  . Alcohol abuse   . BPH (benign prostatic hyperplasia)   . GERD (gastroesophageal reflux disease)     Assessment: 65 y/o M admitted with AECHF developing sustained afib.    Plan:  Magnesium 4 g iv once and f/u AM labs.   Luisa HartChristy, Sande Pickert D 08/03/2016,2:22 PM

## 2016-08-03 NOTE — Progress Notes (Signed)
Physical Therapy Treatment Patient Details Name: Luis [name] MRN: [id number] DOB: 08/04/1951 Today's Date: 08/03/2016    History of Present Illness Pt is a 65 y/o M who presents from his group home with a 2 wk h/o lower leg swelling and SOB with exertion.  Admitting dx: acute CHF.  Pt's PMH includes alcohol abuse.    PT Comments    [name] is making good progress.  He tolerated all therapeutic exercises and was able to demonstrate proper technique with lifting and moving a weighted bag to simulate work environment.  No instability noted while ambulating.  OPPT recommendations remain appropriate for higher level balance training and functional strengthening.     Follow Up Recommendations  Outpatient PT     Equipment Recommendations  None recommended by PT    Recommendations for Other Services       Precautions / Restrictions Precautions Precautions: Fall Restrictions Weight Bearing Restrictions: No    Mobility  Bed Mobility Overal bed mobility: Independent             General bed mobility comments: No physical assist or cues needed  Transfers Overall transfer level: Independent Equipment used: None             General transfer comment: No cues or physical assist needed. No instability noted.  Ambulation/Gait Ambulation/Gait assistance: Supervision Ambulation Distance (Feet): 600 Feet Assistive device: None Gait Pattern/deviations: Step-through pattern   Gait velocity interpretation: at or above normal speed for age/gender General Gait Details: Supervision for safety with high level balance activities documented below   Stairs            Wheelchair Mobility    Modified Rankin (Stroke Patients Only)       Balance Overall balance assessment: Needs assistance Sitting-balance support: No upper extremity supported;Feet supported Sitting balance-Leahy Scale: Normal     Standing balance support: No upper extremity supported;During functional  activity Standing balance-Leahy Scale: Good               High level balance activites: Backward walking;Direction changes;Turns;Sudden stops;Head turns High Level Balance Comments: No instability with high level balance activities, supervision for safety    Cognition Arousal/Alertness: Awake/alert Behavior During Therapy: WFL for tasks assessed/performed Overall Cognitive Status: Within Functional Limits for tasks assessed                      Exercises Other Exercises Other Exercises: Squats 2x10 without UE support Other Exercises: SLS 2x30 seconds on each LE with two fingers intermittently supported on counter Other Exercises: Lifting ~10lb bag to/from countertop to bed x10 with cues for proper body mechanics.  Pt able to perform without any difficulties.  Other Exercises: Rhomberg with eyes closed 2x30 seconds without UE support    General Comments        Pertinent Vitals/Pain Pain Assessment: No/denies pain Pain Intervention(s): Limited activity within patient's tolerance;Monitored during session    Home Living                      Prior Function            PT Goals (current goals can now be found in the care plan section) Acute Rehab PT Goals Patient Stated Goal: to go back to the group home PT Goal Formulation: With patient Time For Goal Achievement: 08/09/16 Potential to Achieve Goals: Good Progress towards PT goals: Progressing toward goals    Frequency    Min 2X/week  PT Plan Current plan remains appropriate    Co-evaluation             End of Session Equipment Utilized During Treatment: Gait belt Activity Tolerance: Patient tolerated treatment well Patient left: in bed;with call bell/phone within reach;Other (comment) (sitting EOB)     Time: [id number] PT Time Calculation (min) (ACUTE ONLY): 21 min  Charges:  $Therapeutic Exercise: 8-22 mins                    G Codes:       Encarnacion ChuAshley Marycatherine Maniscalco PT,  DPT 08/03/2016, 4:18 PM

## 2016-08-03 NOTE — Progress Notes (Signed)
SUBJECTIVE: The patient is alert and oriented this morning and denies any chest pain or shortness of breath. He had an episode of atrial fibrillation with rapid ventricular response during the night and says that he felt some uneasiness in his chest but no chest pain.   Vitals:   08/03/16 0241 08/03/16 0246 08/03/16 0301 08/03/16 0327  BP: 125/86 127/89 125/81 129/79  Pulse: 93 80 95 79  Resp:      Temp:    97.8 F (36.6 C)  TempSrc:    Oral  SpO2: 94% 93% 93% 94%  Weight:      Height:        Intake/Output Summary (Last 24 hours) at 08/03/16 0931 Last data filed at 08/03/16 0900  Gross per 24 hour  Intake             1880 ml  Output             4775 ml  Net            -2895 ml    LABS: Basic Metabolic Panel:  Recent Labs  16/06/9610/07/17 0418 08/03/16 0418  NA 143 143  K 3.6 3.5  CL 109 104  CO2 28 29  GLUCOSE 95 127*  BUN 25* 28*  CREATININE 0.96 1.07  CALCIUM 8.5* 8.6*  MG  --  1.4*   Liver Function Tests:  Recent Labs  07/31/16 1733  AST 40  ALT 22  ALKPHOS 124  BILITOT <0.1*  PROT 6.7  ALBUMIN 3.0*   No results for input(s): LIPASE, AMYLASE in the last 72 hours. CBC:  Recent Labs  07/31/16 1733  08/02/16 0418 08/03/16 0418  WBC 10.1  < > 8.6 11.0*  NEUTROABS 6.9*  --   --   --   HGB 9.0*  < > 7.8* 9.0*  HCT 26.7*  < > 23.9* 27.3*  MCV 90.8  < > 90.8 89.7  PLT 478*  < > 437 448*  < > = values in this interval not displayed. Cardiac Enzymes:  Recent Labs  07/31/16 1733 07/31/16 2355 08/01/16 0519  TROPONINI <0.03 <0.03 <0.03   BNP: Invalid input(s): POCBNP D-Dimer: No results for input(s): DDIMER in the last 72 hours. Hemoglobin A1C: No results for input(s): HGBA1C in the last 72 hours. Fasting Lipid Panel: No results for input(s): CHOL, HDL, LDLCALC, TRIG, CHOLHDL, LDLDIRECT in the last 72 hours. Thyroid Function Tests:  Recent Labs  07/31/16 2355  TSH 3.364   Anemia Panel:  Recent Labs  07/31/16 1733  VITAMINB12 286   FOLATE 11.7  FERRITIN 23*  TIBC 368  IRON 18*     PHYSICAL EXAM General: Thin, in no acute distress HEENT:  Normocephalic and atramatic Neck:  No JVD.  Lungs: Clear bilaterally to auscultation and percussion. Heart: HRRR . Normal S1 and S2 without gallops or murmurs.  Abdomen: Bowel sounds are positive, abdomen soft and non-tender  Msk:  Back normal, normal gait. Normal strength and tone for age. Extremities: No clubbing, cyanosis or edema.   Neuro: Alert and oriented X 3. 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 Patient is being treated with IV Lasix and his breathing is much better.  The patient has had short bursts of paroxysmal atrial fibrillation since admission, however, he developed sustained atrial fibrillation with rapid ventricular response last night. He was given 2 doses of IV  diltiazem and he is now maintaining a normal sinus rhythm. She has difficult to treat with rhythm control medications as amiodarone may affect and already compromised liver related to alcohol intake and sotalol and Tikosyn would not be wise as his alcohol intake may alter his electrolyte levels. Will pursue rate control and increase his metoprolol dose. If needed, will consider adding some diltiazem as well.  The patient was anemic upon admission with a hemoglobin of 7.8. He received a unit of blood yesterday and is also receiving IV iron and hemoglobin is 9.0 today. He does have a history of GI bleed and thus would not recommend anticoagulation at this time. Would advise coated aspirin 81 mg.  The patient says that he has not had any alcohol to drink in one month. However, he has been drinking heavily since he was a young man in the [employer]. Have advised him to do his best to continue sobriety. I have also advised him on the need for continued cardiology  follow-up as an outpatient in order to treat his cardiomyopathy and arrhythmia. He verbalizes agreement.  Active Problems:   Acute CHF [facility](HCC)   Atrial fibrillation (HCC)    Berton BonJanine Paarth Cropper, NP 08/03/2016 9:31 AM

## 2016-08-03 NOTE — Care Management Important Message (Signed)
Important Message  Patient Details  Name: Luis SatoMichael [name] MRN: [id number] Date of Birth: 12/07/1950   Medicare Important Message Given:  Yes    Eber HongGreene, Susi Goslin R, RN 08/03/2016, 4:30 PM

## 2016-08-03 NOTE — Clinical Social Work Note (Addendum)
Clinical Social Work Assessment  Patient Details  Name: Luis [name] MRN: [id number] Date of Birth: 11/29/1950  Date of referral:  08/03/16               Reason for consult:  Other (Comment Required) (Patient is from [facility])                Permission sought to share information with:  Facility Medical sales representativeContact Representative Permission granted to share information::  Yes, Verbal Permission Granted  Name::        Agency::  [facility]  Relationship::  Saddle RidgeSurgeon,Kerry Daughter [phone] or Sofie HartiganLauer,Tina Relative [phone] or Tonette BihariKen Gardner or Courtney HeysWilliam Green staff at [facility] [phone] or [phone].  Contact Information:     Housing/Transportation Living arrangements for the past 2 months:  [facility] Source of Information:  Patient Patient Interpreter Needed:  None Criminal Activity/Legal Involvement Pertinent to Current Situation/Hospitalization:  No - Comment as needed Significant Relationships:  Adult Children, Other Family Members Lives with:  Other [facility] Do you feel safe going back to the place where you live?  Yes Need for family participation in patient care:  No (Coment)  Care giving concerns:  Patient does not feel like he has any concerns about returning back to [facility]   Social Worker assessment / plan:  Patient is a 65 year old male who is from [facility], patient is alert and oriented x 4 and able to express his feelings.  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  Patient states he plans to return back to [facility], and does not express any concerns or issues.  MSW contacted [facility]  and they said patient has to be independent with tasks and able to complete chores at [facility] .  Patient expresses that he is hopeful that he can return back to [facility]  as soon as he can.  Patient did not express any other concerns or questions.  Employment status:  Retired Database administratornsurance information:  Managed Medicare PT Recommendations:   (Outpatient Rehab) Information / Referral to community resources:  Acute Rehab  Patient/Family's Response to care:  Patient agreeable to plan to return back to [facility]   Patient/Family's Understanding of and Emotional Response to Diagnosis, Current Treatment, and Prognosis:  Patient expressed that he is hopeful he will not have to be in hospital much longer.  Patient states he enjoys staying at group home.  Emotional Assessment Appearance:  Appears older than stated age Attitude/Demeanor/Rapport:    Affect (typically observed):  Calm, Appropriate Orientation:  Oriented to Self, Oriented to Place, Oriented to  Time, Oriented to Situation Alcohol / Substance use:  Alcohol Use Psych involvement (Current and /or in the community):  Yes (Comment)  Discharge Needs  Concerns to be addressed:  No discharge needs identified Readmission within the last 30 days:  No Current discharge risk:  None Barriers to Discharge:  No Barriers Identified   Darleene Cleavernterhaus, Dustina Scoggin R 08/03/2016, 12:14 PM

## 2016-08-04 LAB — TYPE AND SCREEN
ABO/RH(D): A POS
ANTIBODY SCREEN: NEGATIVE
Unit division: 0

## 2016-08-04 LAB — CBC
HEMATOCRIT: 27.6 % — AB (ref 40.0–52.0)
Hemoglobin: 9.1 g/dL — ABNORMAL LOW (ref 13.0–18.0)
MCH: 29.2 pg (ref 26.0–34.0)
MCHC: 32.8 g/dL (ref 32.0–36.0)
MCV: 89 fL (ref 80.0–100.0)
PLATELETS: 478 10*3/uL — AB (ref 150–440)
RBC: 3.1 MIL/uL — ABNORMAL LOW (ref 4.40–5.90)
RDW: 17.6 % — AB (ref 11.5–14.5)
WBC: 12.7 10*3/uL — ABNORMAL HIGH (ref 3.8–10.6)

## 2016-08-04 LAB — BASIC METABOLIC PANEL
ANION GAP: 8 (ref 5–15)
BUN: 32 mg/dL — ABNORMAL HIGH (ref 6–20)
CALCIUM: 8.8 mg/dL — AB (ref 8.9–10.3)
CO2: 30 mmol/L (ref 22–32)
CREATININE: 1 mg/dL (ref 0.61–1.24)
Chloride: 103 mmol/L (ref 101–111)
Glucose, Bld: 97 mg/dL (ref 65–99)
Potassium: 3.3 mmol/L — ABNORMAL LOW (ref 3.5–5.1)
SODIUM: 141 mmol/L (ref 135–145)

## 2016-08-04 LAB — PHOSPHORUS: PHOSPHORUS: 3 mg/dL (ref 2.5–4.6)

## 2016-08-04 LAB — MAGNESIUM: MAGNESIUM: 1.7 mg/dL (ref 1.7–2.4)

## 2016-08-04 MED ORDER — LEVALBUTEROL HCL 1.25 MG/0.5ML IN NEBU
1.2500 mg | INHALATION_SOLUTION | Freq: Four times a day (QID) | RESPIRATORY_TRACT | Status: DC
  Administered 2016-08-04 – 2016-08-05 (×3): 1.25 mg via RESPIRATORY_TRACT
  Filled 2016-08-04 (×3): qty 0.5

## 2016-08-04 MED ORDER — AMIODARONE HCL 200 MG PO TABS
400.0000 mg | ORAL_TABLET | Freq: Every day | ORAL | Status: DC
  Administered 2016-08-04 – 2016-08-05 (×2): 400 mg via ORAL
  Filled 2016-08-04: qty 2
  Filled 2016-08-04: qty 1

## 2016-08-04 MED ORDER — POTASSIUM CHLORIDE CRYS ER 20 MEQ PO TBCR
40.0000 meq | EXTENDED_RELEASE_TABLET | Freq: Every day | ORAL | Status: DC
  Administered 2016-08-05: 40 meq via ORAL
  Filled 2016-08-04: qty 2

## 2016-08-04 MED ORDER — DILTIAZEM HCL 25 MG/5ML IV SOLN
10.0000 mg | Freq: Once | INTRAVENOUS | Status: AC
  Administered 2016-08-04: 10 mg via INTRAVENOUS
  Filled 2016-08-04: qty 5

## 2016-08-04 MED ORDER — ASPIRIN EC 81 MG PO TBEC
81.0000 mg | DELAYED_RELEASE_TABLET | Freq: Every day | ORAL | Status: DC
  Administered 2016-08-04 – 2016-08-05 (×2): 81 mg via ORAL
  Filled 2016-08-04 (×2): qty 1

## 2016-08-04 MED ORDER — METHYLPREDNISOLONE SODIUM SUCC 125 MG IJ SOLR
60.0000 mg | Freq: Once | INTRAMUSCULAR | Status: AC
  Administered 2016-08-04: 60 mg via INTRAVENOUS
  Filled 2016-08-04: qty 2

## 2016-08-04 MED ORDER — AMIODARONE HCL 200 MG PO TABS
200.0000 mg | ORAL_TABLET | Freq: Every day | ORAL | Status: DC
  Filled 2016-08-04: qty 1

## 2016-08-04 MED ORDER — POTASSIUM CHLORIDE CRYS ER 20 MEQ PO TBCR
40.0000 meq | EXTENDED_RELEASE_TABLET | Freq: Two times a day (BID) | ORAL | Status: AC
  Administered 2016-08-04 (×2): 40 meq via ORAL
  Filled 2016-08-04 (×3): qty 2

## 2016-08-04 MED ORDER — MAGNESIUM SULFATE 2 GM/50ML IV SOLN
2.0000 g | Freq: Once | INTRAVENOUS | Status: AC
  Administered 2016-08-04: 2 g via INTRAVENOUS
  Filled 2016-08-04: qty 50

## 2016-08-04 MED ORDER — LEVALBUTEROL HCL 1.25 MG/0.5ML IN NEBU: 1.2500 mg | INHALATION_SOLUTION | Freq: Four times a day (QID) | RESPIRATORY_TRACT | Status: DC

## 2016-08-04 NOTE — Progress Notes (Signed)
Pt continues to have a fib with rvr each night despite increased lopressor dose.  Responds to IV diltiazem.  May need PO diliazem.  Luis [name] FIELDING [facility] 08/04/2016, 1:51 AM

## 2016-08-04 NOTE — Progress Notes (Signed)
SUBJECTIVE: Patient is feeling much better   Vitals:   08/04/16 0145 08/04/16 0207 08/04/16 0214 08/04/16 0343  BP: (!) 141/95 137/79 116/74 113/68  Pulse: (!) 138 98 85 63  Resp:    18  Temp:    97.7 F (36.5 C)  TempSrc:    Oral  SpO2: 96% 93% 92% 90%  Weight:      Height:        Intake/Output Summary (Last 24 hours) at 08/04/16 0830 Last data filed at 08/04/16 0003  Gross per 24 hour  Intake              600 ml  Output             2150 ml  Net            -1550 ml    LABS: Basic Metabolic Panel:  Recent Labs  40/98/1110/05/12 0418 08/04/16 0414  NA 143 141  K 3.5 3.3*  CL 104 103  CO2 29 30  GLUCOSE 127* 97  BUN 28* 32*  CREATININE 1.07 1.00  CALCIUM 8.6* 8.8*  MG 1.4* 1.7  PHOS  --  3.0   Liver Function Tests: No results for input(s): AST, ALT, ALKPHOS, BILITOT, PROT, ALBUMIN in the last 72 hours. No results for input(s): LIPASE, AMYLASE in the last 72 hours. CBC:  Recent Labs  08/03/16 0418 08/04/16 0414  WBC 11.0* 12.7*  HGB 9.0* 9.1*  HCT 27.3* 27.6*  MCV 89.7 89.0  PLT 448* 478*   Cardiac Enzymes: No results for input(s): CKTOTAL, CKMB, CKMBINDEX, TROPONINI in the last 72 hours. BNP: Invalid input(s): POCBNP D-Dimer: No results for input(s): DDIMER in the last 72 hours. Hemoglobin A1C: No results for input(s): HGBA1C in the last 72 hours. Fasting Lipid Panel: No results for input(s): CHOL, HDL, LDLCALC, TRIG, CHOLHDL, LDLDIRECT in the last 72 hours. Thyroid Function Tests: No results for input(s): TSH, T4TOTAL, T3FREE, THYROIDAB in the last 72 hours.  Invalid input(s): FREET3 Anemia Panel: No results for input(s): VITAMINB12, FOLATE, FERRITIN, TIBC, IRON, RETICCTPCT in the last 72 hours.   PHYSICAL EXAM General: Well developed, well nourished, in no acute distress HEENT:  Normocephalic and atramatic Neck:  No JVD.  Lungs: Clear bilaterally to auscultation and percussion. Heart: HRRR . Normal S1 and S2 without gallops or murmurs.   Abdomen: Bowel sounds are positive, abdomen soft and non-tender  Msk:  Back normal, normal gait. Normal strength and tone for age. Extremities: No clubbing, cyanosis or edema.   Neuro: Alert and oriented X 3. Psych:  Good affect, responds appropriately  TELEMETRY:Paroxysmal atrial fibrillation  ASSESSMENT AND PLAN: Paroxysmal atrial fibrillation in the setting of CHF. Metoprolol is not enough to control ventricular rate as well as the A. fib recurrence thus will start the patient on amiodarone 400 once a day.  Active Problems:   Acute CHF (HCC)   Atrial fibrillation (HCC)    Steel Kerney A, MD, [facility] 08/04/2016 8:30 AM

## 2016-08-04 NOTE — Progress Notes (Signed)
Pt In A-fib this shift with HR 130's and 140's, pt asymptomatic, MD aware, Cardizem PO given x 1 and Cardizem IV x 2 given with improvement this am.

## 2016-08-04 NOTE — Care Management (Signed)
Spoke with Gerrie NordmannKent Gardner from the [facility].  suggested if agency had any concerns to come to the unit and assess patient. Called CM back and stated that agency would give patient a chance.  Agreeable to assist with transportation to MD appts to the drug store to get medications.  Agreeable for home health nurse if knows when they are coming.  Rudell CobbKent mentioned that patient stated he was going to live with his sister in [place].  AM nor CSW know anything about this.

## 2016-08-04 NOTE — Care Management (Signed)
Barrier to discharge- uncontrolled heart rate requiring change in cardiac medications.  Patient is agreeable to have CM assist with obtaining PCP. Will ask for an appointment to be made at [facility] with Dr French Anaracy McLean-Scocuzza.   Left voicemail  with [facility]  to discuss home health nurse,  assisting patient with transportation to MD appointments and  to get his medications.  He does have insurance coverage for mediations through [facility] and has the ability to pay copays "if they are not too much."  Discussed plan if for some reason the [facility] would not allow him to go back.  He says that CM can call his wife (whom he has stated during this admission no discussion with his wife) and get the phone number of his daughter.  [place] in [place].  CM instructed patient he needs to be the one to call his wife and get his daughter's phone number.  Otherwise, may have to consider shelter.  At present, there are no functional or cognitive issues that would affect patient's ability to continue to [facility]

## 2016-08-04 NOTE — Progress Notes (Addendum)
Occupational Therapy Treatment Patient Details Name: Luis [name] MRN: [id number] DOB: August 29, 1951 Today's Date: 08/04/2016    History of present illness Pt is a 65 y/o M who presents from his group home with a 2 wk h/o lower leg swelling and SOB with exertion.  Admitting dx: acute CHF.  Pt's PMH includes alcohol abuse.   OT comments   Reviewed handout about energy conserv tech and purse lip breathing with pt able to demonstrate teach back.  Pt presents with chest breathing and increased pattern when lying and sitting EOB.  He has been having decreased sats at night and this tech could help with this as well.  Pt states he works at [employer] which is by group home and is afraid they won't hire him back again since he has been sick and in hospital.   All goals met and pt not in need of any further OT services.  Follow Up Recommendations  No OT follow up    Equipment Recommendations       Recommendations for Other Services      Precautions / Restrictions Precautions Precautions: Fall Restrictions Weight Bearing Restrictions: No       Mobility Bed Mobility Overal bed mobility: Independent             General bed mobility comments: No physical assist or cues needed  Transfers Overall transfer level: Independent Equipment used: None             General transfer comment: No cues or physical assist needed. No instability noted.    Balance Overall balance assessment: Needs assistance Sitting-balance support: No upper extremity supported;Feet supported Sitting balance-Leahy Scale: Normal     Standing balance support: No upper extremity supported;During functional activity Standing balance-Leahy Scale: Good               High level balance activites: Side stepping;Backward walking;Direction changes;Turns;Sudden stops;Head turns High Level Balance Comments: No instability with high level balance activities, supervision for safety   ADL                                         General ADL Comments: Reviewed handout about energy conserv tech and purse lip breathing with pt able to demonstrate teach back.  Pt presents with chest breathing and increased pattern when lying and sitting EOB.  He has been having decreased sats at night and this tech could help with this as well.  Pt states he works at [employer] which is by group home and is afraid they won't hire him back again since he has been sick and in hospital.          Vision                     Perception     Praxis      Cognition   Behavior During Therapy: [facility] for tasks assessed/performed Overall Cognitive Status: Within Functional Limits for tasks assessed                       Extremity/Trunk Assessment               Exercises Other Exercises Other Exercises: Squats 3x15 without UE support Other Exercises: SLS 2x30 seconds on each LE with two fingers intermittently supported on counter Other Exercises: Rhomberg with spontaneous perturbations Lt/Rt/ant/post Other Exercises: Rhomberg with eyes  closed 2x30 seconds without UE support   Shoulder Instructions       General Comments      Pertinent Vitals/ Pain       Pain Assessment: 0-10 Pain Score: 1  Pain Location: LEs  Pain Descriptors / Indicators: Aching Pain Intervention(s): Limited activity within patient's tolerance;Monitored during session  Home Living                                          Prior Functioning/Environment              Frequency  Min 1X/week        Progress Toward Goals  OT Goals(current goals can now be found in the care plan section)  Progress towards OT goals: Progressing toward goals  Acute Rehab OT Goals Patient Stated Goal: to go back to the group home OT Goal Formulation: With patient Time For Goal Achievement: 08/13/16 Potential to Achieve Goals: Good  Plan      Co-evaluation                 End of  Session     Activity Tolerance Patient tolerated treatment well   Patient Left in bed;with call bell/phone within reach;with family/visitor present   Nurse Communication          Time: 1335-1400 OT Time Calculation (min): 25 min  Charges: OT General Charges $OT Visit: 1 Procedure OT Treatments $Therapeutic Activity: 23-37 mins  Chrys Racer, OTR/L ascom [phone] 08/04/16, 2:12 PM

## 2016-08-04 NOTE — Progress Notes (Signed)
Physical Therapy Treatment Patient Details Name: Luis [name] MRN: [id number] DOB: 09/01/1951 Today's Date: 08/04/2016    History of Present Illness Pt is a 65 y/o M who presents from his group home with a 2 wk h/o lower leg swelling and SOB with exertion.  Admitting dx: acute CHF.  Pt's PMH includes alcohol abuse.    PT Comments    Mr. Luis [name] completed therapeutic exercises and balance exercises well today without any issues.  Pt is making good progress.  Recommendation for OPPT remains appropriate.    Follow Up Recommendations  Outpatient PT     Equipment Recommendations  None recommended by PT    Recommendations for Other Services       Precautions / Restrictions Precautions Precautions: Fall Restrictions Weight Bearing Restrictions: No    Mobility  Bed Mobility Overal bed mobility: Independent             General bed mobility comments: No physical assist or cues needed  Transfers Overall transfer level: Independent Equipment used: None             General transfer comment: No cues or physical assist needed. No instability noted.  Ambulation/Gait Ambulation/Gait assistance: Supervision Ambulation Distance (Feet): 600 Feet Assistive device: None Gait Pattern/deviations: Step-through pattern   Gait velocity interpretation: at or above normal speed for age/gender General Gait Details: Supervision for safety with high level balance activities documented below   Stairs            Wheelchair Mobility    Modified Rankin (Stroke Patients Only)       Balance Overall balance assessment: Needs assistance Sitting-balance support: No upper extremity supported;Feet supported Sitting balance-Leahy Scale: Normal     Standing balance support: No upper extremity supported;During functional activity Standing balance-Leahy Scale: Good               High level balance activites: Side stepping;Backward walking;Direction changes;Turns;Sudden  stops;Head turns High Level Balance Comments: No instability with high level balance activities, supervision for safety    Cognition Arousal/Alertness: Awake/alert Behavior During Therapy: WFL for tasks assessed/performed Overall Cognitive Status: Within Functional Limits for tasks assessed                      Exercises Other Exercises Other Exercises: Squats 3x15 without UE support Other Exercises: SLS 2x30 seconds on each LE with two fingers intermittently supported on counter Other Exercises: Rhomberg with spontaneous perturbations Lt/Rt/ant/post Other Exercises: Rhomberg with eyes closed 2x30 seconds without UE support    General Comments General comments (skin integrity, edema, etc.): SpO2 remains at or above 94% on RA throughout session.  O2 reapplied at 2L as pt found at start of session as RN reports that pt desats when he sleeps..      Pertinent Vitals/Pain Pain Assessment: No/denies pain Pain Intervention(s): Limited activity within patient's tolerance;Monitored during session    Home Living                      Prior Function            PT Goals (current goals can now be found in the care plan section) Acute Rehab PT Goals Patient Stated Goal: to go back to the group home PT Goal Formulation: With patient Time For Goal Achievement: 08/09/16 Potential to Achieve Goals: Good Progress towards PT goals: Progressing toward goals    Frequency    Min 2X/week      PT Plan Current plan remains  appropriate    Co-evaluation             End of Session Equipment Utilized During Treatment: Gait belt Activity Tolerance: Patient tolerated treatment well Patient left: in bed;with call bell/phone within reach;Other (comment) (with O2 via Delta at 2L)     Time: 6962-95281143-1202 PT Time Calculation (min) (ACUTE ONLY): 19 min  Charges:  $Therapeutic Exercise: 8-22 mins                    G Codes:       Encarnacion ChuAshley Vaun Hyndman PT, DPT 08/04/2016, 12:34  PM

## 2016-08-04 NOTE — Progress Notes (Signed)
[facility] [place] [facility]   PATIENT NAME: Luis SatoMichael [name]    MR#:  [id number]  DATE OF BIRTH:  09/23/1951  SUBJECTIVE:  CHIEF COMPLAINT:   Chief Complaint  Patient presents with  . Foot Swelling  . Rash   - converted to afib last night, has been in and out since admission - wheezing today - feels better otherwise  REVIEW OF SYSTEMS:  Review of Systems  Constitutional: Positive for malaise/fatigue. Negative for chills and fever.  HENT: Negative for ear discharge, ear pain, hearing loss and nosebleeds.   Respiratory: Positive for cough and shortness of breath. Negative for wheezing.   Cardiovascular: Positive for leg swelling. Negative for chest pain and palpitations.  Gastrointestinal: Negative for abdominal pain, constipation, diarrhea, nausea and vomiting.  Genitourinary: Negative for dysuria and urgency.  Musculoskeletal: Positive for falls. Negative for myalgias.  Neurological: Negative for dizziness, sensory change, speech change, focal weakness, seizures and headaches.  Psychiatric/Behavioral: Negative for depression.    DRUG ALLERGIES:  No Known Allergies  VITALS:  Blood pressure 113/68, pulse 63, temperature 97.7 F (36.5 C), temperature source Oral, resp. rate 18, height 6' (1.829 m), weight 73.3 kg (161 lb 8 oz), SpO2 90 %.  PHYSICAL EXAMINATION:  Physical Exam  GENERAL:  65 y.o.-year-old patient lying in the bed with no acute distress.  EYES: Pupils equal, round, reactive to light and accommodation. No scleral icterus. Extraocular muscles intact.  HEENT: Head atraumatic, normocephalic. Oropharynx and nasopharynx clear.  NECK:  Supple, no jugular venous distention. No thyroid enlargement, no tenderness.  LUNGS: scattered wheezing, no rales,rhonchi or crepitation. Occasional coarse rhonchi at the bases noted. No use of accessory muscles of respiration.  CARDIOVASCULAR: S1, S2 normal. Norubs, or gallops. 2/6 systolic murmur is  present ABDOMEN: Soft, nontender, nondistended. Bowel sounds present. No organomegaly or mass.  EXTREMITIES: No  cyanosis, or clubbing. 2+ lower extremity edema noted NEUROLOGIC: Cranial nerves II through XII are intact. Muscle strength 5/5 in all extremities. Sensation intact. Gait not checked.  PSYCHIATRIC: The patient is alert and oriented x 3.  SKIN: Improved rash on the arms and also upper back. Macular rash. Scratch marks and scabs from increased scratching noted. Significant dry skin noted   LABORATORY PANEL:   CBC  Recent Labs Lab 08/04/16 0414  WBC 12.7*  HGB 9.1*  HCT 27.6*  PLT 478*   ------------------------------------------------------------------------------------------------------------------  Chemistries   Recent Labs Lab 07/31/16 1733  08/04/16 0414  NA 141  < > 141  K 4.0  < > 3.3*  CL 110  < > 103  CO2 24  < > 30  GLUCOSE 97  < > 97  BUN 17  < > 32*  CREATININE 0.97  < > 1.00  CALCIUM 8.4*  < > 8.8*  MG  --   < > 1.7  AST 40  --   --   ALT 22  --   --   ALKPHOS 124  --   --   BILITOT <0.1*  --   --   < > = values in this interval not displayed. ------------------------------------------------------------------------------------------------------------------  Cardiac Enzymes  Recent Labs Lab 08/01/16 0519  TROPONINI <0.03   ------------------------------------------------------------------------------------------------------------------  RADIOLOGY:  Dg Chest 2 View  Result Date: 08/02/2016 CLINICAL DATA:  Wheezing and lower extremity edema.  CHF. EXAM: CHEST  2 VIEW COMPARISON:  06/30/2016 FINDINGS: Normal heart size and mediastinal contours. There is diffuse interstitial opacity with Kerley lines and small bilateral pleural effusion that  are layering. No air bronchogram or pneumothorax. IMPRESSION: CHF.  Degree is unchanged from 2 days ago. Electronically Signed   By: Marnee SpringJonathon  Watts M.D.   On: 08/02/2016 10:34   Ct Head Wo  Contrast  Result Date: 08/02/2016 CLINICAL DATA:  Falls. Decreased cognition lately. History of alcohol abuse. EXAM: CT HEAD WITHOUT CONTRAST TECHNIQUE: Contiguous axial images were obtained from the base of the skull through the vertex without intravenous contrast. COMPARISON:  03/03/2016 FINDINGS: Brain: Mild-to-moderate cerebral atrophy is unchanged. A chronic lacunar infarct is again seen in the right corona radiata. There is no evidence of acute cortical infarct, intracranial hemorrhage, mass, midline shift, or extra-axial fluid collection. Vascular: Calcified atherosclerosis at the skullbase. No hyperdense vessel. Skull: No fracture or focal osseous lesion. Sinuses/Orbits: Visualized paranasal sinuses are clear. Small right mastoid effusion, decreased from prior. Visualized orbits are unremarkable. Other: None. IMPRESSION: 1. No evidence of acute intracranial abnormality. 2. Unchanged cerebral atrophy and chronic right corona radiata lacunar infarct. Electronically Signed   By: Sebastian AcheAllen  Grady M.D.   On: 08/02/2016 16:17    EKG:   Orders placed or performed during the hospital encounter of 07/31/16  . ED EKG  . ED EKG  . ED EKG  . ED EKG    ASSESSMENT AND PLAN:   65 year old male with past medical history significant for alcohol abuse, GERD presents to hospital secondary to worsening shortness of breath and also lower extremity edema.  #1 acute CHF-cardiomyopathy and pulmonary vascular congestion noted on chest x-ray. 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 -Appreciate cardiology consult. -Tachycardia with arrhythmias noted to occasionally on the monitor. Now converted to afib - started amiodarone 400mg  daily now- change to 200mg  qdaily at discharge, metoprolol dose increased - Not a candidate for anticoagulation due to GI ulcers and bleeding in the past and anemia. -We'll start  enteric-coated aspirin.  #2 acute on chronic anemia- hemoglobin dropped from 12 to 9 in June 2017 and patient had an EGD done by GI at the time that showed duodenitis and nonbleeding ulcers. -No variceal bleed noted at the time. He has a colonoscopy done in 2014 the results of which are not available but patient states he was reported to be normal -Iron levels are low. Received 3 doses of IV iron. -Due to cardiac symptoms, one unit packed RBC transfusion received and hemoglobin increased appropriately. No active bleeding noted.  #3 skin rash- dry skin, increased scratch marks Improving with Eucerin cream and prednisone-stop at discharge. -On Benadryl when necessary  #4 tobacco use disorder-nicotine patch Has underlying COPD. Added inhalers and nebulizers as needed 1 dose solumedrol today due to active wheezing  #5 duodenal ulcer and duodenitis-continue Protonix. Discontinue naproxen that patient was taking  #6 DVT prophylaxis-discontinue Lovenox due to anemia. -Do Ted's and SCDs  Anticipate discharge tomorrow. Physical therapy consulted. Recommended home health. Will be discharged back to group home.   All the records are reviewed and case discussed with Care Management/Social Workerr. Management plans discussed with the patient, family and they are in agreement.  CODE STATUS: Full code  TOTAL TIME TAKING CARE OF THIS PATIENT: 37 minutes.   POSSIBLE D/C TOMORROW, DEPENDING ON CLINICAL CONDITION.   Enid BaasKALISETTI,Kamuela Magos M.D on 08/04/2016 at 9:40 AM  Between 7am to 6pm - Pager - [phone]  After 6pm go to [email or web address] - Social research officer, governmentpassword EPAS [facility]  [facility]  Office  [phone]  CC: Primary care physician; No PCP  Per Patient

## 2016-08-04 NOTE — Progress Notes (Signed)
Dr. Nemiah CommanderKalisetti notified that patient's 02 SATs drop while sleeping to 89-90%.  When patient is awake and ambulatory patient 02 SATs 94-95%.

## 2016-08-04 NOTE — Progress Notes (Signed)
MEDICATION RELATED CONSULT NOTE - INITIAL   Pharmacy Consult for Electrolyte Monitoring  Indication: Hypomagnesemia  No Known Allergies  Patient Measurements: Height: 6' (182.9 cm) Weight: 161 lb 8 oz (73.3 kg) IBW/kg (Calculated) : 77.6  Vital Signs: Temp: 97.7 F (36.5 C) (11/09 0343) Temp Source: Oral (11/09 0343) BP: 113/68 (11/09 0343) Pulse Rate: 63 (11/09 0343) Intake/Output from previous day: 11/08 0701 - 11/09 0700 In: 600 [P.O.:600] Out: 2650 [Urine:2650] Intake/Output from this shift: Total I/O In: -  Out: 650 [Urine:650]  Labs:  Recent Labs  08/02/16 0418 08/03/16 0418 08/04/16 0414  WBC 8.6 11.0* 12.7*  HGB 7.8* 9.0* 9.1*  HCT 23.9* 27.3* 27.6*  PLT 437 448* 478*  CREATININE 0.96 1.07 1.00  MG  --  1.4* 1.7  PHOS  --   --  3.0   Estimated Creatinine Clearance: 75.3 mL/min (by C-G formula based on SCr of 1 mg/dL).   Microbiology: No results found for this or any previous visit (from the past 720 hour(s)).  Medical History: Past Medical History:  Diagnosis Date  . Alcohol abuse   . BPH (benign prostatic hyperplasia)   . GERD (gastroesophageal reflux disease)     Assessment: 65 y/o M admitted with AECHF developing sustained afib.    Plan:  Magnesium 4 g iv once and f/u AM labs.   11/9 0414 K 3.3, Ca 8.8, corrected Ca 9.6, Mg 1.7, phos 3. Increase Klor-Con to 40 mEq po BID with meals x 2 doses then resume 40 mg po daily. Recheck electrolytes with AM labs.  Carola FrostNathan A Indra Wolters, Pharm.D., BCPS Clinical Pharmacist 08/04/2016,6:24 AM

## 2016-08-05 LAB — BASIC METABOLIC PANEL
Anion gap: 6 (ref 5–15)
BUN: 36 mg/dL — AB (ref 6–20)
CO2: 29 mmol/L (ref 22–32)
CREATININE: 0.92 mg/dL (ref 0.61–1.24)
Calcium: 8.8 mg/dL — ABNORMAL LOW (ref 8.9–10.3)
Chloride: 106 mmol/L (ref 101–111)
GFR calc Af Amer: >60 mL/min (ref >60)
Glucose, Bld: 97 mg/dL (ref 65–99)
Potassium: 4 mmol/L (ref 3.5–5.1)
SODIUM: 141 mmol/L (ref 135–145)

## 2016-08-05 LAB — PHOSPHORUS: Phosphorus: 3 mg/dL (ref 2.5–4.6)

## 2016-08-05 LAB — MAGNESIUM: Magnesium: 1.8 mg/dL (ref 1.7–2.4)

## 2016-08-05 MED ORDER — MIRTAZAPINE 30 MG PO TABS: 30.0000 mg | ORAL_TABLET | Freq: Every day | ORAL | 0 refills | Status: AC

## 2016-08-05 MED ORDER — PREDNISONE 50 MG PO TABS: 50.0000 mg | ORAL_TABLET | Freq: Every day | ORAL | 0 refills | Status: DC

## 2016-08-05 MED ORDER — PREDNISONE 50 MG PO TABS
50.0000 mg | ORAL_TABLET | Freq: Once | ORAL | Status: AC
  Administered 2016-08-05: 50 mg via ORAL
  Filled 2016-08-05: qty 1

## 2016-08-05 MED ORDER — FUROSEMIDE 40 MG PO TABS: 40.0000 mg | ORAL_TABLET | Freq: Every day | ORAL | 0 refills | Status: AC

## 2016-08-05 MED ORDER — ASPIRIN 81 MG PO TBEC: 81.0000 mg | DELAYED_RELEASE_TABLET | Freq: Every day | ORAL | 0 refills | Status: AC

## 2016-08-05 MED ORDER — METOPROLOL TARTRATE 100 MG PO TABS: 100.0000 mg | ORAL_TABLET | Freq: Two times a day (BID) | ORAL | 0 refills | Status: AC

## 2016-08-05 MED ORDER — ENALAPRIL MALEATE 2.5 MG PO TABS: 5.0000 mg | ORAL_TABLET | Freq: Every day | ORAL | 0 refills | Status: AC

## 2016-08-05 MED ORDER — AMIODARONE HCL 400 MG PO TABS: 400.0000 mg | ORAL_TABLET | Freq: Every day | ORAL | 0 refills | Status: AC

## 2016-08-05 MED ORDER — POTASSIUM CHLORIDE CRYS ER 20 MEQ PO TBCR: 20.0000 meq | EXTENDED_RELEASE_TABLET | Freq: Every day | ORAL | 0 refills | Status: AC

## 2016-08-05 MED ORDER — MOMETASONE FURO-FORMOTEROL FUM 200-5 MCG/ACT IN AERO: 2.0000 | INHALATION_SPRAY | Freq: Two times a day (BID) | RESPIRATORY_TRACT | 0 refills | Status: AC

## 2016-08-05 NOTE — Clinical Social Work Note (Signed)
MSW received referral for SNF.  Case discussed with case manager and plan is to discharge back to [facility].  MSW to sign off please re-consult if social work needs arise.  Ervin KnackEric R. Brittian Renaldo, MSW Mon-Fri 8a-4:30p [phone]

## 2016-08-05 NOTE — Progress Notes (Addendum)
  SUBJECTIVE: Patient is feeling better this morning. Denies chest pain/pressue/tightness and is breathing better.   Vitals:   08/04/16 1723 08/04/16 1939 08/04/16 1958 08/05/16 0502  BP: 111/66 128/69  (!) 150/86  Pulse: 77 81  71  Resp:  18  18  Temp:  98 F (36.7 C)  97.6 F (36.4 C)  TempSrc:  Oral  Oral  SpO2:  97% 96% 94%  Weight:      Height:        Intake/Output Summary (Last 24 hours) at 08/05/16 0806 Last data filed at 08/05/16 0504  Gross per 24 hour  Intake             1180 ml  Output              350 ml  Net              830 ml    LABS: Basic Metabolic Panel:  Recent Labs  16/06/9610/09/17 0414 08/05/16 0516  NA 141 141  K 3.3* 4.0  CL 103 106  CO2 30 29  GLUCOSE 97 97  BUN 32* 36*  CREATININE 1.00 0.92  CALCIUM 8.8* 8.8*  MG 1.7 1.8  PHOS 3.0 3.0   Liver Function Tests: No results for input(s): AST, ALT, ALKPHOS, BILITOT, PROT, ALBUMIN in the last 72 hours. No results for input(s): LIPASE, AMYLASE in the last 72 hours. CBC:  Recent Labs  08/03/16 0418 08/04/16 0414  WBC 11.0* 12.7*  HGB 9.0* 9.1*  HCT 27.3* 27.6*  MCV 89.7 89.0  PLT 448* 478*   Cardiac Enzymes: No results for input(s): CKTOTAL, CKMB, CKMBINDEX, TROPONINI in the last 72 hours. BNP: Invalid input(s): POCBNP D-Dimer: No results for input(s): DDIMER in the last 72 hours. Hemoglobin A1C: No results for input(s): HGBA1C in the last 72 hours. Fasting Lipid Panel: No results for input(s): CHOL, HDL, LDLCALC, TRIG, CHOLHDL, LDLDIRECT in the last 72 hours. Thyroid Function Tests: No results for input(s): TSH, T4TOTAL, T3FREE, THYROIDAB in the last 72 hours.  Invalid input(s): FREET3 Anemia Panel: No results for input(s): VITAMINB12, FOLATE, FERRITIN, TIBC, IRON, RETICCTPCT in the last 72 hours.   PHYSICAL EXAM General: Well developed, well nourished, in no acute distress HEENT:  Normocephalic and atramatic Neck:  No JVD.  Lungs: Clear bilaterally to auscultation and  percussion. Heart: HRRR . Normal S1 and S2 without gallops or murmurs.  Abdomen: Bowel sounds are positive, abdomen soft and non-tender  Msk:  Back normal, normal gait. Normal strength and tone for age. Extremities: No clubbing, cyanosis or edema.   Neuro: Alert and oriented X 3. Psych:  Good affect, responds appropriately  TELEMETRY: Sinus rhythm with Occ PAC's, last episode of atrial fib on tele was last evening at 1840  ASSESSMENT AND PLAN: Cardiomyopathy, likely alcohol induced, with paroxysmal atrial fibrillation. Breathing and edema are better with diuresis. Atrial fibrillation is responding well to amiodarone with no episode since last evening. Pt is stable for discharge on amiodarone 200 mg daily for rhythm control and aspirin. Follow up appointment given for 08/11/16 at 10:00 [facility] for cardi[facility] and pt strongly urged to follow up with heart failure treatment.   Active Problems:   Acute CHF [facility](HCC)   Atrial fibrillation (HCC)    Berton BonJanine Kymberlee Viger, NP 08/05/2016 8:06 AM

## 2016-08-05 NOTE — Progress Notes (Signed)
Patient given discharge teaching and paperwork regarding medications, diet, follow-up appointments and activity. Patient understanding verbalized. No complaints at this time. IV and telemetry discontinued prior to leaving. Skin assessment as previously charted and vitals are stable; on room air. [facility] being [facility].  No further needs by Care Management or Social Work. Transportation provided by [facility]. Prescriptions handed to patient.

## 2016-08-05 NOTE — Care Management (Signed)
patient confirms to cm that he has the money to pay for medication copays.  Luis [name] from [facility].  Informed kent of the appointments with cardiology and heart failure clinic.  Unable to arrange for home health as there was not a physician that would sign orders and most importantly patient is not homebound. It does not take a taxing effort to leave his home.  These two resources should meet patient's needs.

## 2016-08-15 NOTE — Discharge Summary (Signed)
[employer] - [place] [facility]   PATIENT NAME: Luis [name]    MR#:  [id number]  DATE OF BIRTH:  12/30/1950  DATE OF ADMISSION:  07/31/2016 ADMITTING PHYSICIAN: Auburn BilberryShreyang Patel, MD  DATE OF DISCHARGE: 08/05/2016  1:23 PM  PRIMARY CARE PHYSICIAN: No PCP Per Patient   ADMISSION DIAGNOSIS:  Cardiovascular beri-beri [E51.11]  DISCHARGE DIAGNOSIS:  Active Problems:   Acute CHF (HCC)   Atrial fibrillation (HCC)   SECONDARY DIAGNOSIS:   Past Medical History:  Diagnosis Date  . Alcohol abuse   . BPH (benign prostatic hyperplasia)   . GERD (gastroesophageal reflux disease)      ADMITTING HISTORY  HISTORY OF PRESENT ILLNESS: Luis SatoMichael Arman  is a 65 y.o. male with a known history of  Alcohol abuse, GERD who has not drank in more than a month. Who presents to the ED with complaint of lower extremity swelling and shortness of breath. Patient reports that he was seen in the emergency room on October 23 at that time he was diagnosed with dehydration and was given aggressive IV fluids. Then he was discharged. After that episode he started noticing swelling of his lower extremity and progressive shortness of breath. Patient reports  no chest pain or palpitations. He does describe nocturnal dyspnea.  HOSPITAL COURSE:   65 year old male with past medical history significant for alcohol abuse, GERD presents to hospital secondary to worsening shortness of breath and also lower extremity edema.  #1 acute CHF-cardiomyopathy and pulmonary vascular congestion noted on chest x-ray. 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 -Tachycardia with arrhythmias noted to occasionally on the monitor. converted to afib. NSR on discharge - started amiodarone 400mg  daily now- change to 200mg  qdaily at discharge, metoprolol dose  increased - Not a candidate for anticoagulation due to GI ulcers and bleeding in the past and anemia. -We'll start enteric-coated aspirin.  #2 acute on chronic anemia- hemoglobin dropped from 12 to 9 in June 2017 and patient had an EGD done by GI at the time that showed duodenitis and nonbleeding ulcers. -No variceal bleed noted at the time. He has a colonoscopy done in 2014 the results of which are not available but patient states he was reported to be normal -Iron levels are low. Received 3 doses of IV iron. -Due to cardiac symptoms, one unit packed RBC transfusion received and hemoglobin increased appropriately. No active bleeding noted.  #3 skin rash- dry skin, increased scratch marks Improving with Eucerin cream and prednisone-stop at discharge. -On Benadryl when necessary  #4 Acute bronchitis With mild wheezing Started on prednisone  #5 duodenal ulcer and duodenitis-continue Protonix. Discontinue naproxen that patient was taking  Stable for discharge  CONSULTS OBTAINED:  Treatment Team:  Laurier NancyShaukat A Khan, MD  DRUG ALLERGIES:  No Known Allergies  DISCHARGE MEDICATIONS:   Discharge Medication List as of 08/05/2016 12:19 PM    START taking these medications   Details  amiodarone (PACERONE) 400 MG tablet Take 1 tablet (400 mg total) by mouth daily., Starting Sat 08/06/2016, Print    aspirin EC 81 MG EC tablet Take 1 tablet (81 mg total) by mouth daily., Starting Sat 08/06/2016, No Print    enalapril (VASOTEC) 2.5 MG tablet Take 2 tablets (5 mg total) by mouth daily., Starting Sat 08/06/2016, Print    furosemide (LASIX) 40 MG tablet Take 1 tablet (40 mg total) by mouth daily.,  Starting Fri 08/05/2016, Print    metoprolol (LOPRESSOR) 100 MG tablet Take 1 tablet (100 mg total) by mouth 2 (two) times daily., Starting Fri 08/05/2016, Print    mometasone-formoterol (DULERA) 200-5 MCG/ACT AERO Inhale 2 puffs into the lungs 2 (two) times daily., Starting Fri 08/05/2016, Print     potassium chloride SA (K-DUR,KLOR-CON) 20 MEQ tablet Take 1 tablet (20 mEq total) by mouth daily., Starting Sat 08/06/2016, Print    predniSONE (DELTASONE) 50 MG tablet Take 1 tablet (50 mg total) by mouth daily with breakfast., Starting Fri 08/05/2016, Print      CONTINUE these medications which have CHANGED   Details  mirtazapine (REMERON) 30 MG tablet Take 1 tablet (30 mg total) by mouth at bedtime., Starting Fri 08/05/2016, Print      CONTINUE these medications which have NOT CHANGED   Details  esomeprazole (NEXIUM) 20 MG capsule Take 20 mg by mouth daily at 12 noon. Reported on 03/03/2016, Historical Med    feeding supplement, ENSURE ENLIVE, (ENSURE ENLIVE) LIQD Take 237 mLs by mouth 2 (two) times daily between meals., Starting Mon 03/07/2016, Print    Skin Protectants, Misc. (EUCERIN) cream Apply topically as needed for wound care., Starting Thu 07/14/2016, Print    nicotine (NICODERM CQ - DOSED IN MG/24 HOURS) 21 mg/24hr patch Place 1 patch (21 mg total) onto the skin daily as needed (if patient is current tobacco user and requests patch)., Starting Mon 03/07/2016, Print    tamsulosin (FLOMAX) 0.4 MG CAPS capsule Take 1 capsule (0.4 mg total) by mouth daily., Starting Wed 12/09/2015, Print      STOP taking these medications     naproxen (NAPROSYN) 500 MG tablet         Today   VITAL SIGNS:  Blood pressure 137/73, pulse 71, temperature 97.8 F (36.6 C), temperature source Oral, resp. rate 14, height 6' (1.829 m), weight 73.3 kg (161 lb 8 oz), SpO2 93 %.  I/O:  No intake or output data in the 24 hours ending 08/15/16 1814  PHYSICAL EXAMINATION:  Physical Exam  GENERAL:  65 y.o.-year-old patient lying in the bed with no acute distress.  LUNGS: Normal breath sounds bilaterally, no wheezing, rales,rhonchi or crepitation. No use of accessory muscles of respiration.  CARDIOVASCULAR: S1, S2 normal. No murmurs, rubs, or gallops.  ABDOMEN: Soft, non-tender, non-distended.  Bowel sounds present. No organomegaly or mass.  NEUROLOGIC: Moves all 4 extremities. PSYCHIATRIC: The patient is alert and oriented x 3.  SKIN: No obvious rash, lesion, or ulcer.   DATA REVIEW:   CBC No results for input(s): WBC, HGB, HCT, PLT in the last 168 hours.  Chemistries  No results for input(s): NA, K, CL, CO2, GLUCOSE, BUN, CREATININE, CALCIUM, MG, AST, ALT, ALKPHOS, BILITOT in the last 168 hours.  Invalid input(s): GFRCGP  Cardiac Enzymes No results for input(s): TROPONINI in the last 168 hours.  Microbiology Results  Results for orders placed or performed in visit on 10/27/12  Culture, blood (single)     Status: None   Collection Time: 10/27/12  2:12 PM  Result Value Ref Range Status   Micro Text Report   Final       COMMENT                   NO GROWTH AEROBICALLY/ANAEROBICALLY IN 5 DAYS   ANTIBIOTIC  RADIOLOGY:  No results found.  Follow up with PCP in 1 week.  Management plans discussed with the patient, family and they are in agreement.  CODE STATUS:  Code Status History    Date Active Date Inactive Code Status Order ID Comments User Context   07/31/2016  9:06 PM 08/05/2016  4:24 PM Full Code [id number]  Auburn Bilberry, MD ED   03/10/2016  4:42 PM 03/14/2016  7:07 PM Full Code [id number]  Katha Hamming, MD ED   03/03/2016 11:09 PM 03/08/2016  4:01 PM Full Code [id number]  Gery Pray, MD Inpatient   12/03/2015  2:39 PM 12/09/2015 10:24 PM Full Code [id number]  Katha Hamming, MD ED   06/18/2015 10:38 AM 06/20/2015  2:52 PM Full Code [id number]  Alford Highland, MD ED      TOTAL TIME TAKING CARE OF THIS PATIENT ON DAY OF DISCHARGE: more than 30 minutes.   Milagros Loll R M.D on 08/15/2016 at 6:14 PM  Between 7am to 6pm - Pager - [phone]  After 6pm go to [email or web address] - password [email or web address] [facility]  [facility] [place] [facility]  Office  [phone]  CC: Primary care physician; No PCP Per  Patient  Note: This dictation was prepared with Dragon dictation along with smaller phrase technology. Any transcriptional errors that result from this process are unintentional.

## 2016-08-25 ENCOUNTER — Ambulatory Visit: Payer: Medicare HMO | Admitting: Family

## 2016-08-25 ENCOUNTER — Telehealth: Payer: Self-pay | Admitting: Family

## 2016-08-25 NOTE — Telephone Encounter (Signed)
Patient missed his initial appointment at [facility] on 08/25/16. Will attempt to reschedule.

## 2017-01-09 ENCOUNTER — Encounter: Payer: Self-pay | Admitting: *Deleted

## 2017-01-09 ENCOUNTER — Emergency Department
Admission: EM | Admit: 2017-01-09 | Discharge: 2017-01-09 | Disposition: A | Discharge status: Home / Self Care | Payer: Medicare HMO | Attending: Student in an Organized Health Care Education/Training Program | Admitting: Student in an Organized Health Care Education/Training Program

## 2017-01-09 ENCOUNTER — Emergency Department: Payer: Medicare HMO

## 2017-01-09 DIAGNOSIS — M79641 Pain in right hand: Secondary | ICD-10-CM | POA: Insufficient documentation

## 2017-01-09 DIAGNOSIS — Z7982 Long term (current) use of aspirin: Secondary | ICD-10-CM | POA: Diagnosis not present

## 2017-01-09 DIAGNOSIS — M79644 Pain in right finger(s): Secondary | ICD-10-CM | POA: Diagnosis not present

## 2017-01-09 DIAGNOSIS — I509 Heart failure, unspecified: Secondary | ICD-10-CM | POA: Insufficient documentation

## 2017-01-09 DIAGNOSIS — F17210 Nicotine dependence, cigarettes, uncomplicated: Secondary | ICD-10-CM | POA: Diagnosis not present

## 2017-01-09 LAB — COMPREHENSIVE METABOLIC PANEL
ALBUMIN: 4.2 g/dL (ref 3.5–5.0)
ALT: 35 U/L (ref 17–63)
ANION GAP: 8 (ref 5–15)
AST: 37 U/L (ref 15–41)
Alkaline Phosphatase: 100 U/L (ref 38–126)
BUN: 25 mg/dL — AB (ref 6–20)
CALCIUM: 9 mg/dL (ref 8.9–10.3)
CO2: 20 mmol/L — ABNORMAL LOW (ref 22–32)
CREATININE: 1.16 mg/dL (ref 0.61–1.24)
Chloride: 108 mmol/L (ref 101–111)
GFR calc Af Amer: >60 mL/min (ref >60)
GFR calc non Af Amer: >60 mL/min (ref >60)
GLUCOSE: 89 mg/dL (ref 65–99)
Potassium: 4.1 mmol/L (ref 3.5–5.1)
Sodium: 136 mmol/L (ref 135–145)
TOTAL PROTEIN: 7 g/dL (ref 6.5–8.1)
Total Bilirubin: 0.7 mg/dL (ref 0.3–1.2)

## 2017-01-09 LAB — CBC
HEMATOCRIT: 36.4 % — AB (ref 40.0–52.0)
Hemoglobin: 12.2 g/dL — ABNORMAL LOW (ref 13.0–18.0)
MCH: 29.1 pg (ref 26.0–34.0)
MCHC: 33.4 g/dL (ref 32.0–36.0)
MCV: 87.2 fL (ref 80.0–100.0)
Platelets: 249 10*3/uL (ref 150–440)
RBC: 4.18 MIL/uL — ABNORMAL LOW (ref 4.40–5.90)
RDW: 15.9 % — ABNORMAL HIGH (ref 11.5–14.5)
WBC: 9.8 10*3/uL (ref 3.8–10.6)

## 2017-01-09 LAB — URIC ACID: Uric Acid, Serum: 6 mg/dL (ref 4.4–7.6)

## 2017-01-09 MED ORDER — HYDROCODONE-ACETAMINOPHEN 5-325 MG PO TABS
ORAL_TABLET | ORAL | Status: AC
  Filled 2017-01-09: qty 1

## 2017-01-09 MED ORDER — DOXYLAMINE SUCCINATE (SLEEP) 25 MG PO TABS: 25.0000 mg | ORAL_TABLET | Freq: Every evening | ORAL | 0 refills | Status: AC | PRN

## 2017-01-09 MED ORDER — NAPROXEN 500 MG PO TABS: 500.0000 mg | ORAL_TABLET | Freq: Two times a day (BID) | ORAL | 0 refills | Status: DC

## 2017-01-09 MED ORDER — PREDNISONE 20 MG PO TABS
60.0000 mg | ORAL_TABLET | Freq: Once | ORAL | Status: AC
  Administered 2017-01-09: 60 mg via ORAL
  Filled 2017-01-09: qty 3

## 2017-01-09 MED ORDER — PREDNISONE 10 MG PO TABS: 10.0000 mg | ORAL_TABLET | Freq: Every day | ORAL | 0 refills | Status: DC

## 2017-01-09 MED ORDER — HYDROCODONE-ACETAMINOPHEN 5-325 MG PO TABS
1.0000 | ORAL_TABLET | Freq: Once | ORAL | Status: AC
  Administered 2017-01-09: 1 via ORAL

## 2017-01-09 NOTE — ED Triage Notes (Signed)
States 3 days ago he began to have right middle finger pain, at present right middle finger swollen and swelling goes down to hand,  Denies any injury or bite

## 2017-01-09 NOTE — ED Provider Notes (Addendum)
[facility] Emergency Department Provider Note    First MD Initiated Contact with Patient 01/09/17 1142     (approximate)  I have reviewed the triage vital signs and the nursing notes.   HISTORY  Chief Complaint Hand Pain    HPI Luis [name] is a 66 y.o. male with acute right middle finger pain that started roughly 3 days ago. Patient does have a history of gout but has never had it in his hand. He denies any fevers. No trauma. States he does have a nodule on the knuckle that he thinks he bumped against some weights while he was at work.Since then has had worsening pain and swelling on the middle finger. He is right-hand dominant. States he is unable to sleep last night due to pain.   Past Medical History:  Diagnosis Date  . Alcohol abuse   . BPH (benign prostatic hyperplasia)   . GERD (gastroesophageal reflux disease)    Family History  Problem Relation Age of Onset  . Alcohol abuse Mother   . Alcohol abuse Father    Past Surgical History:  Procedure Laterality Date  . CHOLECYSTECTOMY    . ESOPHAGOGASTRODUODENOSCOPY N/A 03/12/2016   Procedure: ESOPHAGOGASTRODUODENOSCOPY (EGD);  Surgeon: Scot Jun, MD;  Location: [facility] ENDOSCOPY;  Service: Endoscopy;  Laterality: N/A;   Patient Active Problem List   Diagnosis Date Noted  . Atrial fibrillation (HCC) 08/01/2016  . Acute CHF (HCC) 07/31/2016  . Pressure ulcer 03/12/2016  . Failure to thrive (0-17) 03/10/2016  . Protein-calorie malnutrition, severe 03/04/2016  . 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 Alcohol abuse 12/07/2015  . Delirium tremens (HCC) 12/07/2015  . Malnutrition of moderate degree 12/04/2015  . Hyponatremia 06/18/2015      Prior to Admission medications   Medication Sig Start Date End Date Taking? Authorizing Provider  amiodarone (PACERONE) 400 MG tablet Take 1 tablet (400 mg total) by mouth daily. 08/06/16   Milagros Loll, MD    aspirin EC 81 MG EC tablet Take 1 tablet (81 mg total) by mouth daily. 08/06/16   Milagros Loll, MD  doxylamine, Sleep, (UNISOM) 25 MG tablet Take 1 tablet (25 mg total) by mouth at bedtime as needed. 01/09/17   Willy Eddy, MD  enalapril (VASOTEC) 2.5 MG tablet Take 2 tablets (5 mg total) by mouth daily. 08/06/16   Srikar Sudini, MD  esomeprazole (NEXIUM) 20 MG capsule Take 20 mg by mouth daily at 12 noon. Reported on 03/03/2016    Historical Provider, MD  feeding supplement, ENSURE ENLIVE, (ENSURE ENLIVE) LIQD Take 237 mLs by mouth 2 (two) times daily between meals. 03/07/16   Adrian Saran, MD  furosemide (LASIX) 40 MG tablet Take 1 tablet (40 mg total) by mouth daily. 08/05/16   Milagros Loll, MD  metoprolol (LOPRESSOR) 100 MG tablet Take 1 tablet (100 mg total) by mouth 2 (two) times daily. 08/05/16   Srikar Sudini, MD  mirtazapine (REMERON) 30 MG tablet Take 1 tablet (30 mg total) by mouth at bedtime. 08/05/16   Srikar Sudini, MD  mometasone-formoterol (DULERA) 200-5 MCG/ACT AERO Inhale 2 puffs into the lungs 2 (two) times daily. 08/05/16   Milagros Loll, MD  naproxen (NAPROSYN) 500 MG tablet Take 1 tablet (500 mg total) by mouth 2 (two) times daily with a meal. 01/09/17 01/09/18  Willy Eddy, MD  nicotine (NICODERM CQ - DOSED IN MG/24 HOURS) 21 mg/24hr patch Place 1 patch (21 mg total) onto the skin daily as needed (  if patient is current tobacco user and requests patch). Patient not taking: Reported on 07/31/2016 03/07/16   Adrian Saran, MD  potassium chloride SA (K-DUR,KLOR-CON) 20 MEQ tablet Take 1 tablet (20 mEq total) by mouth daily. 08/06/16   Srikar Sudini, MD  predniSONE (DELTASONE) 10 MG tablet Take 1 tablet (10 mg total) by mouth daily. Day 1-2: Take 50 mg  ( 5 pills) Day 3-4 : Take 40 mg (4pills) Day 5-6: Take 30 mg (3 pills) Day 7-8:  Take 20 mg (2 pills) Day 9:  Take  (1 pill) 01/09/17   Willy Eddy, MD  predniSONE (DELTASONE) 50 MG tablet Take 1 tablet (50 mg total) by  mouth daily with breakfast. 08/05/16   Milagros Loll, MD  Skin Protectants, Misc. (EUCERIN) cream Apply topically as needed for wound care. 07/14/16   Jene Every, MD  tamsulosin (FLOMAX) 0.4 MG CAPS capsule Take 1 capsule (0.4 mg total) by mouth daily. Patient not taking: Reported on 07/31/2016 12/09/15   Enid Baas, MD    Allergies Patient has no known allergies.    Social History Social History  Substance Use Topics  . Smoking status: Current Every Day Smoker    Packs/day: 0.50    Types: Cigarettes  . Smokeless tobacco: Never Used  . Alcohol use No     Comment: stopped etoh 2 weeks ago    Review of Systems Patient denies headaches, rhinorrhea, blurry vision, numbness, shortness of breath, chest pain, edema, cough, abdominal pain, nausea, vomiting, diarrhea, dysuria, fevers, rashes or hallucinations unless otherwise stated above in HPI. ____________________________________________   PHYSICAL EXAM:  VITAL SIGNS: Vitals:   01/09/17 1042  BP: (!) 147/63  Pulse: 62  Resp: 18  Temp: 97.8 F (36.6 C)    Constitutional: Alert and oriented. Well appearing and in no acute distress. Eyes: Conjunctivae are normal. PERRL. EOMI. Head: Atraumatic. Nose: No congestion/rhinnorhea. Mouth/Throat: Mucous membranes are moist.  Oropharynx non-erythematous. Neck: No stridor. Painless ROM. No cervical spine tenderness to palpation Hematological/Lymphatic/Immunilogical: No cervical lymphadenopathy. Cardiovascular: Normal rate, regular rhythm. Grossly normal heart sounds.  Good peripheral circulation. Respiratory: Normal respiratory effort.  No retractions. Lungs CTAB. Gastrointestinal: Soft and nontender. No distention. No abdominal bruits. No CVA tenderness. Genitourinary:  Musculoskeletal: large swelling and ttp to posterior aspect of right PIP of third digit.  Pain with palpaion of lateral aspect of joint.  No streaking erythema. No tenderness to palpation along the flexor  tendon sheath. There is no fusiform swelling. Pain is relieved with extension. There is no pain with palpation to the palm. There is no laceration. Neurologic:  Normal speech and language. No gross focal neurologic deficits are appreciated. No gait instability. Skin:  Skin is warm, dry and intact. No rash noted. Psychiatric: Mood and affect are normal. Speech and behavior are normal.  ____________________________________________   LABS (all labs ordered are listed, but only abnormal results are displayed)  Results for orders placed or performed during the hospital encounter of 01/09/17 (from the past 24 hour(s))  CBC     Status: Abnormal   Collection Time: 01/09/17 10:54 AM  Result Value Ref Range   WBC 9.8 3.8 - 10.6 K/uL   RBC 4.18 (L) 4.40 - 5.90 MIL/uL   Hemoglobin 12.2 (L) 13.0 - 18.0 g/dL   HCT 16.1 (L) 09.6 - 04.5 %   MCV 87.2 80.0 - 100.0 fL   MCH 29.1 26.0 - 34.0 pg   MCHC 33.4 32.0 - 36.0 g/dL   RDW 40.9 (H) 81.1 -  14.5 %   Platelets 249 150 - 440 K/uL  Comprehensive metabolic panel     Status: Abnormal   Collection Time: 01/09/17 10:54 AM  Result Value Ref Range   Sodium 136 135 - 145 mmol/L   Potassium 4.1 3.5 - 5.1 mmol/L   Chloride 108 101 - 111 mmol/L   CO2 20 (L) 22 - 32 mmol/L   Glucose, Bld 89 65 - 99 mg/dL   BUN 25 (H) 6 - 20 mg/dL   Creatinine, Ser 9.60 0.61 - 1.24 mg/dL   Calcium 9.0 8.9 - 45.4 mg/dL   Total Protein 7.0 6.5 - 8.1 g/dL   Albumin 4.2 3.5 - 5.0 g/dL   AST 37 15 - 41 U/L   ALT 35 17 - 63 U/L   Alkaline Phosphatase 100 38 - 126 U/L   Total Bilirubin 0.7 0.3 - 1.2 mg/dL   GFR calc non Af Amer >60 >60 mL/min   GFR calc Af Amer >60 >60 mL/min   Anion gap 8 5 - 15  Uric acid     Status: None   Collection Time: 01/09/17 10:54 AM  Result Value Ref Range   Uric Acid, Serum 6.0 4.4 - 7.6 mg/dL   ____________________________________________ ____________________________________________  RADIOLOGY  I personally reviewed all radiographic  images ordered to evaluate for the above acute complaints and reviewed radiology reports and findings.  These findings were personally discussed with the patient.  Please see medical record for radiology report.   EMERGENCY DEPARTMENT US SOFT TISSUE INTERPRETATION "Study: Limited Soft Tissue Ultrasound"  INDICATIONS: Pain Multiple views of the body part were obtained in real-time with a multi-frequency linear probe  PERFORMED BY: Myself IMAGES ARCHIVED?: No SIDE:Right  BODY PART:Upper extremity INTERPRETATION:  No abcess noted, No cellulitis noted and Normal soft tissue ultrasound    ____________________________________________   PROCEDURES  Procedure(s) performed:  Procedures    Critical Care performed: no ____________________________________________   INITIAL IMPRESSION / ASSESSMENT AND PLAN / ED COURSE  Pertinent labs & imaging results that were available during my care of the patient were reviewed by me and considered in my medical decision making (see chart for details).  DDX: gout, flexor teno, fracture, arthritis  Luis [name] is a 66 y.o. who presents to the ED with swelling and pain to the right middle finger as described above. He is afebrile and nontoxic. Blood work is reassuring. This is not consistent with acute flexor Tina synovitis. He has no Kanavels signs.  No evidence of fracture on x-ray. Ultrasound shows no abscess. There is no evidence of edema along the flexor tendon sheath. Given his history of gout I do suspect this may be recurrent gout. Will treat pain.    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 As he has no fever and no white count at this time do not feel that antibiotics clinically indicated. We will do steroid taper as well as anti-inflammatories. Patient also requested  something to help him with sleep. We'll give him prescription for Unisom.  Have discussed with the patient and available family all diagnostics and treatments performed thus far and all questions were answered to the best of my ability. The patient demonstrates understanding and agreement with plan.   ____________________________________________   FINAL CLINICAL IMPRESSION(S) / ED DIAGNOSES  Final diagnoses:  Right hand pain  Pain of right middle finger      NEW MEDICATIONS STARTED  DURING THIS VISIT:  New Prescriptions   DOXYLAMINE, SLEEP, (UNISOM) 25 MG TABLET    Take 1 tablet (25 mg total) by mouth at bedtime as needed.   NAPROXEN (NAPROSYN) 500 MG TABLET    Take 1 tablet (500 mg total) by mouth 2 (two) times daily with a meal.   PREDNISONE (DELTASONE) 10 MG TABLET    Take 1 tablet (10 mg total) by mouth daily. Day 1-2: Take 50 mg  ( 5 pills) Day 3-4 : Take 40 mg (4pills) Day 5-6: Take 30 mg (3 pills) Day 7-8:  Take 20 mg (2 pills) Day 9:  Take  (1 pill)     Note:  This document was prepared using Dragon voice recognition software and may include unintentional dictation errors.    Willy Eddy, MD 01/09/17 1413    Willy Eddy, MD 01/23/17 [phone]

## 2017-01-20 ENCOUNTER — Emergency Department: Payer: Medicare HMO

## 2017-01-20 ENCOUNTER — Encounter: Payer: Self-pay | Admitting: Emergency Medicine

## 2017-01-20 ENCOUNTER — Emergency Department
Admission: EM | Admit: 2017-01-20 | Discharge: 2017-01-20 | Disposition: A | Discharge status: Home / Self Care | Payer: Medicare HMO | Attending: Emergency Medicine | Admitting: Emergency Medicine

## 2017-01-20 DIAGNOSIS — F17210 Nicotine dependence, cigarettes, uncomplicated: Secondary | ICD-10-CM | POA: Diagnosis not present

## 2017-01-20 DIAGNOSIS — Z7982 Long term (current) use of aspirin: Secondary | ICD-10-CM | POA: Insufficient documentation

## 2017-01-20 DIAGNOSIS — Z79899 Other long term (current) drug therapy: Secondary | ICD-10-CM | POA: Diagnosis not present

## 2017-01-20 DIAGNOSIS — M79644 Pain in right finger(s): Secondary | ICD-10-CM | POA: Diagnosis present

## 2017-01-20 DIAGNOSIS — I509 Heart failure, unspecified: Secondary | ICD-10-CM | POA: Diagnosis not present

## 2017-01-20 DIAGNOSIS — M109 Gout, unspecified: Secondary | ICD-10-CM | POA: Diagnosis not present

## 2017-01-20 LAB — COMPREHENSIVE METABOLIC PANEL
ALT: 36 U/L (ref 17–63)
AST: 30 U/L (ref 15–41)
Albumin: 4.1 g/dL (ref 3.5–5.0)
Alkaline Phosphatase: 75 U/L (ref 38–126)
Anion gap: 8 (ref 5–15)
BUN: 27 mg/dL — ABNORMAL HIGH (ref 6–20)
CHLORIDE: 105 mmol/L (ref 101–111)
CO2: 25 mmol/L (ref 22–32)
CREATININE: 1.16 mg/dL (ref 0.61–1.24)
Calcium: 9.4 mg/dL (ref 8.9–10.3)
GFR calc Af Amer: >60 mL/min (ref >60)
Glucose, Bld: 98 mg/dL (ref 65–99)
Potassium: 4 mmol/L (ref 3.5–5.1)
Sodium: 138 mmol/L (ref 135–145)
Total Bilirubin: 0.5 mg/dL (ref 0.3–1.2)
Total Protein: 6.9 g/dL (ref 6.5–8.1)

## 2017-01-20 LAB — CBC
HCT: 37 % — ABNORMAL LOW (ref 40.0–52.0)
Hemoglobin: 12.7 g/dL — ABNORMAL LOW (ref 13.0–18.0)
MCH: 29.9 pg (ref 26.0–34.0)
MCHC: 34.4 g/dL (ref 32.0–36.0)
MCV: 87 fL (ref 80.0–100.0)
PLATELETS: 299 10*3/uL (ref 150–440)
RBC: 4.25 MIL/uL — AB (ref 4.40–5.90)
RDW: 16.3 % — ABNORMAL HIGH (ref 11.5–14.5)
WBC: 14.4 10*3/uL — ABNORMAL HIGH (ref 3.8–10.6)

## 2017-01-20 LAB — SEDIMENTATION RATE: SED RATE: 14 mm/h (ref 0–20)

## 2017-01-20 LAB — URIC ACID: Uric Acid, Serum: 5.6 mg/dL (ref 4.4–7.6)

## 2017-01-20 LAB — C-REACTIVE PROTEIN: CRP: 1.4 mg/dL — ABNORMAL HIGH (ref <1.0)

## 2017-01-20 MED ORDER — HYDROCODONE-ACETAMINOPHEN 5-325 MG PO TABS: 1.0000 | ORAL_TABLET | Freq: Four times a day (QID) | ORAL | 0 refills | Status: AC | PRN

## 2017-01-20 MED ORDER — INDOMETHACIN 50 MG PO CAPS: 50.0000 mg | ORAL_CAPSULE | Freq: Two times a day (BID) | ORAL | 0 refills | Status: DC

## 2017-01-20 MED ORDER — NAPROXEN 500 MG PO TABS: 500.0000 mg | ORAL_TABLET | Freq: Two times a day (BID) | ORAL | 0 refills | Status: AC

## 2017-01-20 MED ORDER — PREDNISONE 10 MG PO TABS: 50.0000 mg | ORAL_TABLET | Freq: Every day | ORAL | 0 refills | Status: DC

## 2017-01-20 MED ORDER — HYDROCODONE-ACETAMINOPHEN 5-325 MG PO TABS
1.0000 | ORAL_TABLET | Freq: Once | ORAL | Status: AC
  Administered 2017-01-20: 1 via ORAL
  Filled 2017-01-20: qty 1

## 2017-01-20 MED ORDER — NAPROXEN 500 MG PO TABS
500.0000 mg | ORAL_TABLET | Freq: Once | ORAL | Status: AC
  Administered 2017-01-20: 500 mg via ORAL
  Filled 2017-01-20: qty 1

## 2017-01-20 MED ORDER — PREDNISONE 20 MG PO TABS
60.0000 mg | ORAL_TABLET | Freq: Once | ORAL | Status: AC
  Administered 2017-01-20: 60 mg via ORAL
  Filled 2017-01-20: qty 3

## 2017-01-20 MED ORDER — PREDNISONE 10 MG PO TABS: 50.0000 mg | ORAL_TABLET | Freq: Every day | ORAL | 0 refills | Status: AC

## 2017-01-20 MED ORDER — HYDROCODONE-ACETAMINOPHEN 5-325 MG PO TABS: 1.0000 | ORAL_TABLET | Freq: Four times a day (QID) | ORAL | 0 refills | Status: DC | PRN

## 2017-01-20 MED ORDER — INDOMETHACIN 50 MG PO CAPS: 50.0000 mg | ORAL_CAPSULE | Freq: Once | ORAL | Status: DC

## 2017-01-20 NOTE — ED Notes (Signed)
See triage note   States he was seen about 1-2 weeks ago for same  Pain and swelling to right middle finger  Denies any injury  But thinks it is gout

## 2017-01-20 NOTE — ED Provider Notes (Signed)
[facility] Emergency Department Provider Note  ____________________________________________  Time seen: Approximately 10:17 AM  I have reviewed the triage vital signs and the nursing notes.   HISTORY  Chief Complaint Hand Problem    HPI Luis [name] is a 66 y.o. male that presents to the emergency department with right middle finger pain since Monday. Patient states that he was seen here 2 weeks ago for the same thing. He was discharged with prednisone and NSAIDs. He states that the first couple days on prednisone his symptoms improved but then once he got down to the lower dosage, symptoms quit getting better. When he left the emergency department last time he continued to work using hand. On Monday pain started to get worse again. Pain feels the same as it did 2 weeks ago. He is having difficulty bending finger. He states that he has an enlarged finger normally from an accident several years ago. 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 He denies fever, shortness of breath, chest pain, nausea, vomiting, abdominal pain.   Past Medical History:  Diagnosis Date  . Alcohol abuse   . BPH (benign prostatic hyperplasia)   . GERD (gastroesophageal reflux disease)     Patient Active Problem List   Diagnosis Date Noted  . Atrial fibrillation (HCC) 08/01/2016  . Acute CHF (HCC) 07/31/2016  . Pressure ulcer 03/12/2016  . Failure to thrive (0-17) 03/10/2016  . Protein-calorie malnutrition, severe 03/04/2016  . 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 Alcohol abuse 12/07/2015  . Delirium tremens (HCC) 12/07/2015  . Malnutrition of moderate degree 12/04/2015  . Hyponatremia 06/18/2015    Past Surgical History:  Procedure Laterality Date  . CHOLECYSTECTOMY    . ESOPHAGOGASTRODUODENOSCOPY N/A 03/12/2016   Procedure: ESOPHAGOGASTRODUODENOSCOPY (EGD);  Surgeon: Scot Jun, MD;  Location:  [facility] ENDOSCOPY;  Service: Endoscopy;  Laterality: N/A;    Prior to Admission medications   Medication Sig Start Date End Date Taking? Authorizing Provider  amiodarone (PACERONE) 400 MG tablet Take 1 tablet (400 mg total) by mouth daily. 08/06/16   Milagros Loll, MD  aspirin EC 81 MG EC tablet Take 1 tablet (81 mg total) by mouth daily. 08/06/16   Milagros Loll, MD  doxylamine, Sleep, (UNISOM) 25 MG tablet Take 1 tablet (25 mg total) by mouth at bedtime as needed. 01/09/17   Willy Eddy, MD  doxylamine, Sleep, (UNISOM) 25 MG tablet Take 1 tablet (25 mg total) by mouth at bedtime as needed. 01/09/17   Willy Eddy, MD  enalapril (VASOTEC) 2.5 MG tablet Take 2 tablets (5 mg total) by mouth daily. 08/06/16   Srikar Sudini, MD  esomeprazole (NEXIUM) 20 MG capsule Take 20 mg by mouth daily at 12 noon. Reported on 03/03/2016    Historical Provider, MD  feeding supplement, ENSURE ENLIVE, (ENSURE ENLIVE) LIQD Take 237 mLs by mouth 2 (two) times daily between meals. 03/07/16   Adrian Saran, MD  furosemide (LASIX) 40 MG tablet Take 1 tablet (40 mg total) by mouth daily. 08/05/16   Milagros Loll, MD  HYDROcodone-acetaminophen (NORCO/VICODIN) 5-325 MG tablet Take 1 tablet by mouth every 6 (six) hours as needed for moderate pain. 01/20/17   Enid Derry, PA-C  metoprolol (LOPRESSOR) 100 MG tablet Take 1 tablet (100 mg total) by mouth 2 (two) times daily. 08/05/16   Srikar Sudini, MD  mirtazapine (REMERON) 30 MG tablet Take 1 tablet (30 mg total) by mouth at bedtime. 08/05/16  Srikar Sudini, MD  mometasone-formoterol (DULERA) 200-5 MCG/ACT AERO Inhale 2 puffs into the lungs 2 (two) times daily. 08/05/16   Milagros Loll, MD  naproxen (NAPROSYN) 500 MG tablet Take 1 tablet (500 mg total) by mouth 2 (two) times daily with a meal. 01/20/17 01/20/18  Enid Derry, PA-C  nicotine (NICODERM CQ - DOSED IN MG/24 HOURS) 21 mg/24hr patch Place 1 patch (21 mg total) onto the skin daily as needed (if patient is current  tobacco user and requests patch). Patient not taking: Reported on 07/31/2016 03/07/16   Adrian Saran, MD  potassium chloride SA (K-DUR,KLOR-CON) 20 MEQ tablet Take 1 tablet (20 mEq total) by mouth daily. 08/06/16   Srikar Sudini, MD  predniSONE (DELTASONE) 10 MG tablet Take 5 tablets (50 mg total) by mouth daily. 01/20/17 01/25/17  Enid Derry, PA-C  Skin Protectants, Misc. (EUCERIN) cream Apply topically as needed for wound care. 07/14/16   Jene Every, MD  tamsulosin (FLOMAX) 0.4 MG CAPS capsule Take 1 capsule (0.4 mg total) by mouth daily. Patient not taking: Reported on 07/31/2016 12/09/15   Enid Baas, MD    Allergies Patient has no known allergies.  Family History  Problem Relation Age of Onset  . Alcohol abuse Mother   . Alcohol abuse Father     Social History Social History  Substance Use Topics  . Smoking status: Current Every Day Smoker    Packs/day: 0.50    Types: Cigarettes  . Smokeless tobacco: Never Used  . Alcohol use No     Comment: stopped etoh 2 weeks ago     Review of Systems  Constitutional: No fever/chills Cardiovascular: No chest pain. Respiratory: No SOB. Gastrointestinal: No abdominal pain.  No nausea, no vomiting.  Genitourinary: Negative for dysuria, urgency, frequency. Skin: Negative for rash, abrasions, lacerations, ecchymosis. Neurological: Negative for headaches, numbness or tingling   ____________________________________________   PHYSICAL EXAM:  VITAL SIGNS: ED Triage Vitals  Enc Vitals Group     BP 01/20/17 0922 (!) 142/77     Pulse Rate 01/20/17 0922 74     Resp 01/20/17 0922 16     Temp 01/20/17 0922 98 F (36.7 C)     Temp Source 01/20/17 0922 Oral     SpO2 01/20/17 0922 99 %     Weight 01/20/17 0919 170 lb (77.1 kg)     Height 01/20/17 0919 6' (1.829 m)     Head Circumference --      Peak Flow --      Pain Score 01/20/17 0918 9     Pain Loc --      Pain Edu? --      Excl. in GC? --      Constitutional: Alert and  oriented. Well appearing and in no acute distress. Eyes: Conjunctivae are normal. PERRL. EOMI. Head: Atraumatic. ENT:      Ears:      Nose: No congestion/rhinnorhea.      Mouth/Throat: Mucous membranes are moist.  Neck: No stridor.  Cardiovascular: Normal rate, regular rhythm.  Good peripheral circulation. Respiratory: Normal respiratory effort without tachypnea or retractions. Lungs CTAB. Good air entry to the bases with no decreased or absent breath sounds. Gastrointestinal: Bowel sounds 4 quadrants. Soft and nontender to palpation. No guarding or rigidity. No palpable masses. No distention. No CVA tenderness. Musculoskeletal: Swelling and tenderness to palpation at PIP digit on dorsal side of right middle finger. No erythema. No tenderness to palpation along the flexor tendon sheath. Neurologic:  Normal speech and  language. No gross focal neurologic deficits are appreciated.  Skin:  Skin is warm, dry and intact. No rash noted.    ____________________________________________   LABS (all labs ordered are listed, but only abnormal results are displayed)  Labs Reviewed  CBC - Abnormal; Notable for the following:       Result Value   WBC 14.4 (*)    RBC 4.25 (*)    Hemoglobin 12.7 (*)    HCT 37.0 (*)    RDW 16.3 (*)    All other components within normal limits  COMPREHENSIVE METABOLIC PANEL - Abnormal; Notable for the following:    BUN 27 (*)    All other components within normal limits  SEDIMENTATION RATE  URIC ACID  C-REACTIVE PROTEIN   ____________________________________________  EKG   ____________________________________________  RADIOLOGY   No results found.  ____________________________________________    PROCEDURES  Procedure(s) performed:    Procedures    Medications  HYDROcodone-acetaminophen (NORCO/VICODIN) 5-325 MG per tablet 1 tablet (1 tablet Oral Given 01/20/17 1116)  predniSONE (DELTASONE) tablet 60 mg (60 mg Oral Given 01/20/17 1117)   naproxen (NAPROSYN) tablet 500 mg (500 mg Oral Given 01/20/17 1117)     ____________________________________________   INITIAL IMPRESSION / ASSESSMENT AND PLAN / ED COURSE  Pertinent labs & imaging results that were available during my care of the patient were reviewed by me and considered in my medical decision making (see chart for details).  Review of the [place] CSRS was performed in accordance of the [facility] prior to dispensing any controlled drugs.  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 Vital signs and exam are reassuring. Patient refused repeat x-ray. White count elevated at 14.4, which could be because symptoms have been going on for 1 month. We will do a high-dose of prednisone for a couple of days since this helped patient initially. Symptoms quit improving once he was on the lower dose of steroids. Patient will be discharged home with prescriptions for prednisone and naprosyn. Patient will follow up with PCP to see if daily medication as necessary. Patient is to follow up with PCP as directed. Patient is given ED precautions to return to the ED for any worsening or new symptoms.   ____________________________________________  FINAL CLINICAL IMPRESSION(S) / ED DIAGNOSES  Final diagnoses:  Gouty arthritis      NEW MEDICATIONS STARTED DURING THIS VISIT:  Discharge Medication List as of 01/20/2017 11:16 AM          This chart was dictated using voice recognition software/Dragon. Despite best efforts to proofread, errors can occur which can change the meaning. Any change was purely unintentional.    Enid Derry, PA-C 01/20/17 1143    Sharyn Creamer, MD 01/20/17 [phone]

## 2017-01-20 NOTE — ED Triage Notes (Addendum)
Pt reports had gout to 3rd digit right hand.  Flared up again this past Monday.  Has swelling to one side of finger normally but other side has swelled now also.  No redness noted. Had flare a couple weeks ago that went away but reports it came back. No fevers

## 2017-01-26 ENCOUNTER — Telehealth: Payer: Self-pay | Admitting: Emergency Medicine

## 2017-01-26 NOTE — Telephone Encounter (Signed)
Called patient to ask about follow up plans.  Late result for crp is elevated.  No pcp is listed.  The number for patient is [facility], so I left a message with my number.

## 2017-01-31 ENCOUNTER — Encounter: Payer: Self-pay | Admitting: Emergency Medicine

## 2017-01-31 ENCOUNTER — Emergency Department
Admission: EM | Admit: 2017-01-31 | Discharge: 2017-01-31 | Disposition: A | Discharge status: Home / Self Care | Payer: Medicare HMO | Attending: Emergency Medicine | Admitting: Emergency Medicine

## 2017-01-31 DIAGNOSIS — F17210 Nicotine dependence, cigarettes, uncomplicated: Secondary | ICD-10-CM | POA: Diagnosis not present

## 2017-01-31 DIAGNOSIS — B029 Zoster without complications: Secondary | ICD-10-CM | POA: Insufficient documentation

## 2017-01-31 DIAGNOSIS — I509 Heart failure, unspecified: Secondary | ICD-10-CM | POA: Diagnosis not present

## 2017-01-31 DIAGNOSIS — Z7982 Long term (current) use of aspirin: Secondary | ICD-10-CM | POA: Insufficient documentation

## 2017-01-31 DIAGNOSIS — Z79899 Other long term (current) drug therapy: Secondary | ICD-10-CM | POA: Insufficient documentation

## 2017-01-31 DIAGNOSIS — R21 Rash and other nonspecific skin eruption: Secondary | ICD-10-CM | POA: Diagnosis present

## 2017-01-31 NOTE — ED Provider Notes (Signed)
[facility] Emergency Department Provider Note  ____________________________________________  Time seen: Approximately 5:56 PM  I have reviewed the triage vital signs and the nursing notes.   HISTORY  Chief Complaint Rash   HPI Luis [name] is a 66 y.o. male presents with rash on his upper back and left axilla. He believes the rash on the upper back developed approximately 2-3 weeks ago and the one in the axilla region approximately a 2 weeks ago. Patient describes the rash as painful and stinging. Patient states self treating with showers, soap and water and alcohol. Patient denies past history of shingles episodes. He denies any contact allergies or possible insect bite.   Past Medical History:  Diagnosis Date  . Alcohol abuse   . BPH (benign prostatic hyperplasia)   . GERD (gastroesophageal reflux disease)     Patient Active Problem List   Diagnosis Date Noted  . Atrial fibrillation (HCC) 08/01/2016  . Acute CHF (HCC) 07/31/2016  . Pressure ulcer 03/12/2016  . Failure to thrive (0-17) 03/10/2016  . Protein-calorie malnutrition, severe 03/04/2016  . 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 Alcohol abuse 12/07/2015  . Delirium tremens (HCC) 12/07/2015  . Malnutrition of moderate degree 12/04/2015  . Hyponatremia 06/18/2015    Past Surgical History:  Procedure Laterality Date  . CHOLECYSTECTOMY    . ESOPHAGOGASTRODUODENOSCOPY N/A 03/12/2016   Procedure: ESOPHAGOGASTRODUODENOSCOPY (EGD);  Surgeon: Scot Junobert T Elliott, MD;  [facility] ENDOSCOPY;  Service: Endoscopy;  Laterality: N/A;    Prior to Admission medications   Medication Sig Start Date End Date Taking? Authorizing Provider  amiodarone (PACERONE) 400 MG tablet Take 1 tablet (400 mg total) by mouth daily. 08/06/16   Milagros LollSudini, Srikar, MD  aspirin EC 81 MG EC tablet Take 1 tablet (81 mg total) by mouth daily. 08/06/16   Milagros LollSudini, Srikar, MD  doxylamine,  Sleep, (UNISOM) 25 MG tablet Take 1 tablet (25 mg total) by mouth at bedtime as needed. 01/09/17   Willy Eddyobinson, Patrick, MD  doxylamine, Sleep, (UNISOM) 25 MG tablet Take 1 tablet (25 mg total) by mouth at bedtime as needed. 01/09/17   Willy Eddyobinson, Patrick, MD  enalapril (VASOTEC) 2.5 MG tablet Take 2 tablets (5 mg total) by mouth daily. 08/06/16   Milagros LollSudini, Srikar, MD  esomeprazole (NEXIUM) 20 MG capsule Take 20 mg by mouth daily at 12 noon. Reported on 03/03/2016    [provider]  feeding supplement, ENSURE ENLIVE, (ENSURE ENLIVE) LIQD Take 237 mLs by mouth 2 (two) times daily between meals. 03/07/16   Adrian SaranMody, Sital, MD  furosemide (LASIX) 40 MG tablet Take 1 tablet (40 mg total) by mouth daily. 08/05/16   Milagros LollSudini, Srikar, MD  HYDROcodone-acetaminophen (NORCO/VICODIN) 5-325 MG tablet Take 1 tablet by mouth every 6 (six) hours as needed for moderate pain. 01/20/17   Enid DerryWagner, Ashley, PA-C  metoprolol (LOPRESSOR) 100 MG tablet Take 1 tablet (100 mg total) by mouth 2 (two) times daily. 08/05/16   Milagros LollSudini, Srikar, MD  mirtazapine (REMERON) 30 MG tablet Take 1 tablet (30 mg total) by mouth at bedtime. 08/05/16   Milagros LollSudini, Srikar, MD  mometasone-formoterol (DULERA) 200-5 MCG/ACT AERO Inhale 2 puffs into the lungs 2 (two) times daily. 08/05/16   Milagros LollSudini, Srikar, MD  naproxen (NAPROSYN) 500 MG tablet Take 1 tablet (500 mg total) by mouth 2 (two) times daily with a meal. 01/20/17 01/20/18  Enid DerryWagner, Ashley, PA-C  nicotine (NICODERM CQ - DOSED IN MG/24 HOURS) 21 mg/24hr patch Place 1 patch (21 mg total)  onto the skin daily as needed (if patient is current tobacco user and requests patch). Patient not taking: Reported on 07/31/2016 03/07/16   Adrian Saran, MD  potassium chloride SA (K-DUR,KLOR-CON) 20 MEQ tablet Take 1 tablet (20 mEq total) by mouth daily. 08/06/16   Milagros Loll, MD  Skin Protectants, Misc. (EUCERIN) cream Apply topically as needed for wound care. 07/14/16   Jene Every, MD  tamsulosin (FLOMAX) 0.4 MG CAPS  capsule Take 1 capsule (0.4 mg total) by mouth daily. Patient not taking: Reported on 07/31/2016 12/09/15   Enid Baas, MD    Allergies Patient has no known allergies.  Family History  Problem Relation Age of Onset  . Alcohol abuse Mother   . Alcohol abuse Father     Social History Social History  Substance Use Topics  . Smoking status: Current Every Day Smoker    Packs/day: 0.50    Types: Cigarettes  . Smokeless tobacco: Never Used  . Alcohol use No     Comment: stopped etoh 2 weeks ago    Review of Systems  Constitutional: Negative for fever/chills Respiratory: Negative for shortness of breath. Musculoskeletal: Negativefor pain. Skin: Rash along upper back and left axilla Neurological: Negative for headaches, focal weakness or numbness. ____________________________________________   PHYSICAL EXAM:  VITAL SIGNS: ED Triage Vitals  Enc Vitals Group     BP 01/31/17 0938 (!) 158/71     Pulse Rate 01/31/17 0938 68     Resp 01/31/17 0938 15     Temp 01/31/17 0938 98.1 F (36.7 C)     Temp Source 01/31/17 0938 Oral     SpO2 01/31/17 0938 97 %     Weight 01/31/17 0932 170 lb (77.1 kg)     Height --      Head Circumference --      Peak Flow --      Pain Score 01/31/17 1134 2     Pain Loc --      Pain Edu? --      Excl. in GC? --      Constitutional: Alert and oriented. Well appearing and in no acute distress. Eyes: Conjunctivae are normal. EOMI. Nose: No congestion/rhinnorhea. Mouth/Throat: Mucous membranes are moist.   Neck: No stridor. Lymphatic: Negative cervical lymphadenopathy. Cardiovascular: Good peripheral circulation. Respiratory: Normal respiratory effort.  No retractions. Lungs CTA Musculoskeletal: FROM throughout. Neurologic:  Normal speech and language. No gross focal neurologic deficits are appreciated. Skin:  Healing zoster blisters along upper back and left axilla  ____________________________________________   LABS (all labs  ordered are listed, but only abnormal results are displayed)  Labs Reviewed - No data to display ____________________________________________  EKG none ____________________________________________  RADIOLOGY none ____________________________________________   PROCEDURES  Procedure(s) performed: no ____________________________________________   INITIAL IMPRESSION / ASSESSMENT AND PLAN / ED COURSE   Pertinent labs & imaging results that were available during my care of the patient were reviewed by me and considered in my medical decision making (see chart for details).  Patients rash consistent with healing zoster without complication. presents with rash on his upper back and left axilla. Zoster blister are self-limiting and nearly healed. Recommended he continue taking Ibuprofen for pain relief until blisters resolve. Advised to follow up with PCP as needed and return to emergency department if symptoms worsened.   ____________________________________________   FINAL CLINICAL IMPRESSION(S) / ED DIAGNOSES  Final diagnoses:  Herpes zoster without complication    Discharge Medication List as of 01/31/2017 11:14 AM  If controlled substance prescribed during this visit, 12 month history viewed on the [place] prior to issuing an initial prescription for Schedule II or III opiod.   Note:  This document was prepared using Dragon voice recognition software and may include unintentional dictation errors.I reviewed the patient's prescription history over the last 12 months in the multi-state controlled substances database(s) that includes [place], [place], [place], [place], [place], [place], [place], [place], [place], [place], [place], [place], [place], and [place].  The patient has filled no controlled substances during that time.   Clois Comber, PA-C 01/31/17 1900    Jene Every, MD 02/01/17 1257

## 2017-01-31 NOTE — ED Triage Notes (Signed)
Rash to upper back for two weeks.

## 2017-01-31 NOTE — ED Notes (Signed)
FIRST NURSE: scabbed rash noted to upper back and left axilla for two weeks.

## 2017-01-31 NOTE — ED Notes (Signed)
See triage note  States he developed rash to upper back about 2 weeks ago.Marland Kitchen..Marland Kitchen

## 2017-04-24 ENCOUNTER — Encounter: Payer: Self-pay | Admitting: Emergency Medicine

## 2017-04-24 ENCOUNTER — Emergency Department: Payer: Medicare HMO

## 2017-04-24 ENCOUNTER — Emergency Department
Admission: EM | Admit: 2017-04-24 | Discharge: 2017-04-24 | Disposition: A | Discharge status: Home / Self Care | Payer: Medicare HMO | Attending: Emergency Medicine | Admitting: Emergency Medicine

## 2017-04-24 DIAGNOSIS — Z79899 Other long term (current) drug therapy: Secondary | ICD-10-CM | POA: Diagnosis not present

## 2017-04-24 DIAGNOSIS — Z7982 Long term (current) use of aspirin: Secondary | ICD-10-CM | POA: Diagnosis not present

## 2017-04-24 DIAGNOSIS — F17210 Nicotine dependence, cigarettes, uncomplicated: Secondary | ICD-10-CM | POA: Insufficient documentation

## 2017-04-24 DIAGNOSIS — M25522 Pain in left elbow: Secondary | ICD-10-CM | POA: Diagnosis present

## 2017-04-24 DIAGNOSIS — M7021 Olecranon bursitis, right elbow: Secondary | ICD-10-CM

## 2017-04-24 MED ORDER — OXYCODONE-ACETAMINOPHEN 5-325 MG PO TABS: 1.0000 | ORAL_TABLET | Freq: Four times a day (QID) | ORAL | 0 refills | Status: AC | PRN

## 2017-04-24 MED ORDER — NAPROXEN 500 MG PO TABS: 500.0000 mg | ORAL_TABLET | Freq: Two times a day (BID) | ORAL | Status: AC

## 2017-04-24 NOTE — ED Provider Notes (Signed)
[facility] Emergency Department Provider Note   ____________________________________________   First MD Initiated Contact with Patient 04/24/17 1203     (approximate)  I have reviewed the triage vital signs and the nursing notes.   HISTORY  Chief Complaint Arm Pain    HPI Luis [name] is a 66 y.o. male patient complaining of left elbow edema and pain secondary to contusion. Patient state he hit his elbow against a wall 2 days ago. Patient state increasing pain and swelling. Patient stated pain increases with extension of the elbow. Patient denies loss of sensation.   Past Medical History:  Diagnosis Date  . Alcohol abuse   . BPH (benign prostatic hyperplasia)   . GERD (gastroesophageal reflux disease)     Patient Active Problem List   Diagnosis Date Noted  . Atrial fibrillation (HCC) 08/01/2016  . Acute CHF (HCC) 07/31/2016  . Pressure ulcer 03/12/2016  . Failure to thrive (0-17) 03/10/2016  . Protein-calorie malnutrition, severe 03/04/2016  . 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 Alcohol abuse 12/07/2015  . Delirium tremens (HCC) 12/07/2015  . Malnutrition of moderate degree 12/04/2015  . Hyponatremia 06/18/2015    Past Surgical History:  Procedure Laterality Date  . CHOLECYSTECTOMY    . ESOPHAGOGASTRODUODENOSCOPY N/A 03/12/2016   Procedure: ESOPHAGOGASTRODUODENOSCOPY (EGD);  Surgeon: Scot Jun, MD;  Location: [facility] ENDOSCOPY;  Service: Endoscopy;  Laterality: N/A;    Prior to Admission medications   Medication Sig Start Date End Date Taking? Authorizing Provider  amiodarone (PACERONE) 400 MG tablet Take 1 tablet (400 mg total) by mouth daily. 08/06/16   Milagros Loll, MD  aspirin EC 81 MG EC tablet Take 1 tablet (81 mg total) by mouth daily. 08/06/16   Milagros Loll, MD  doxylamine, Sleep, (UNISOM) 25 MG tablet Take 1 tablet (25 mg total) by mouth at bedtime as needed. 01/09/17    Willy Eddy, MD  doxylamine, Sleep, (UNISOM) 25 MG tablet Take 1 tablet (25 mg total) by mouth at bedtime as needed. 01/09/17   Willy Eddy, MD  enalapril (VASOTEC) 2.5 MG tablet Take 2 tablets (5 mg total) by mouth daily. 08/06/16   Milagros Loll, MD  esomeprazole (NEXIUM) 20 MG capsule Take 20 mg by mouth daily at 12 noon. Reported on 03/03/2016    [provider]  feeding supplement, ENSURE ENLIVE, (ENSURE ENLIVE) LIQD Take 237 mLs by mouth 2 (two) times daily between meals. 03/07/16   Adrian Saran, MD  furosemide (LASIX) 40 MG tablet Take 1 tablet (40 mg total) by mouth daily. 08/05/16   Milagros Loll, MD  HYDROcodone-acetaminophen (NORCO/VICODIN) 5-325 MG tablet Take 1 tablet by mouth every 6 (six) hours as needed for moderate pain. 01/20/17   Enid Derry, PA-C  metoprolol (LOPRESSOR) 100 MG tablet Take 1 tablet (100 mg total) by mouth 2 (two) times daily. 08/05/16   Milagros Loll, MD  mirtazapine (REMERON) 30 MG tablet Take 1 tablet (30 mg total) by mouth at bedtime. 08/05/16   Milagros Loll, MD  mometasone-formoterol (DULERA) 200-5 MCG/ACT AERO Inhale 2 puffs into the lungs 2 (two) times daily. 08/05/16   Milagros Loll, MD  naproxen (NAPROSYN) 500 MG tablet Take 1 tablet (500 mg total) by mouth 2 (two) times daily with a meal. 01/20/17 01/20/18  Enid Derry, PA-C  naproxen (NAPROSYN) 500 MG tablet Take 1 tablet (500 mg total) by mouth 2 (two) times daily with a meal. 04/24/17   Joni Reining, PA-C  nicotine (NICODERM CQ -  DOSED IN MG/24 HOURS) 21 mg/24hr patch Place 1 patch (21 mg total) onto the skin daily as needed (if patient is current tobacco user and requests patch). Patient not taking: Reported on 07/31/2016 03/07/16   Adrian SaranMody, Sital, MD  oxyCODONE-acetaminophen (ROXICET) 5-325 MG tablet Take 1 tablet by mouth every 6 (six) hours as needed. 04/24/17 04/24/18  Joni ReiningSmith, Mackenze Grandison K, PA-C  potassium chloride SA (K-DUR,KLOR-CON) 20 MEQ tablet Take 1 tablet (20 mEq total) by  mouth daily. 08/06/16   Milagros LollSudini, Srikar, MD  Skin Protectants, Misc. (EUCERIN) cream Apply topically as needed for wound care. 07/14/16   Jene EveryKinner, Robert, MD  tamsulosin (FLOMAX) 0.4 MG CAPS capsule Take 1 capsule (0.4 mg total) by mouth daily. Patient not taking: Reported on 07/31/2016 12/09/15   Enid BaasKalisetti, Radhika, MD    Allergies Patient has no known allergies.  Family History  Problem Relation Age of Onset  . Alcohol abuse Mother   . Alcohol abuse Father     Social History Social History  Substance Use Topics  . Smoking status: Current Every Day Smoker    Packs/day: 0.50    Types: Cigarettes  . Smokeless tobacco: Never Used  . Alcohol use No     Comment: stopped etoh 2 weeks ago    Review of Systems  Constitutional: No fever/chills Eyes: No visual changes. ENT: No sore throat. Cardiovascular: Denies chest pain. Respiratory: Denies shortness of breath. Gastrointestinal: No abdominal pain.  No nausea, no vomiting.  No diarrhea.  No constipation. Genitourinary: Negative for dysuria. Musculoskeletal: Left elbow pain and edema  Skin: Negative for rash. Edema to the left elbow Neurological: Negative for headaches, focal weakness or numbness. Psychiatric:Alcohol abuse and Delirium [place] ____________________________________________   PHYSICAL EXAM:  VITAL SIGNS: ED Triage Vitals  Enc Vitals Group     BP 04/24/17 1117 (!) 131/106     Pulse Rate 04/24/17 1117 85     Resp 04/24/17 1117 16     Temp 04/24/17 1117 97.8 F (36.6 C)     Temp Source 04/24/17 1117 Oral     SpO2 04/24/17 1117 98 %     Weight 04/24/17 1115 165 lb (74.8 kg)     Height 04/24/17 1115 6' (1.829 m)     Head Circumference --      Peak Flow --      Pain Score 04/24/17 1115 9     Pain Loc --      Pain Edu? --      Excl. in GC? --     Constitutional: Alert and oriented. Well appearing and in no acute distress. Cardiovascular: Normal rate, regular rhythm. Grossly normal heart sounds.  Good  peripheral circulation. Elevated diastolic blood pressure Respiratory: Normal respiratory effort.  No retractions. Lungs CTAB. Gastrointestinal: Soft and nontender. No distention. No abdominal bruits. No CVA tenderness. Musculoskeletal: No edema decreased range of motion to the left elbow.  Neurologic:  Normal speech and language. No gross focal neurologic deficits are appreciated. No gait instability. Skin:  Skin is warm, dry and intact. No rash noted. Edematous bursa left elbow. Psychiatric: Mood and affect are normal. Speech and behavior are normal.  ____________________________________________   LABS (all labs ordered are listed, but only abnormal results are displayed)  Labs Reviewed - No data to display ____________________________________________  EKG   ____________________________________________  RADIOLOGY  Dg Elbow Complete Left  Result Date: 04/24/2017 CLINICAL DATA:  Left elbow pain after injury 3 days ago. EXAM: LEFT ELBOW - COMPLETE 3+ VIEW COMPARISON:  None.  FINDINGS: There is no evidence of fracture, dislocation, or joint effusion. There is no evidence of arthropathy or other focal bone abnormality. Soft tissues are unremarkable. IMPRESSION: Normal left elbow. Electronically Signed   By: Lupita RaiderJames  Green Jr, M.D.   On: 04/24/2017 12:16    ____________________________________________   PROCEDURES  Procedure(s) performed: None  Procedures  Critical Care performed: No  ____________________________________________   INITIAL IMPRESSION / ASSESSMENT AND PLAN / ED COURSE  Pertinent labs & imaging results that were available during my care of the patient were reviewed by me and considered in my medical decision making (see chart for details).  Traumatic olecranon bursitis. Patient given discharge care instructions. Patient advised follow orthopedics clinic. Patient advised take medication as directed.      ____________________________________________   FINAL  CLINICAL IMPRESSION(S) / ED DIAGNOSES  Final diagnoses:  Olecranon bursitis of right elbow      NEW MEDICATIONS STARTED DURING THIS VISIT:  New Prescriptions   NAPROXEN (NAPROSYN) 500 MG TABLET    Take 1 tablet (500 mg total) by mouth 2 (two) times daily with a meal.   OXYCODONE-ACETAMINOPHEN (ROXICET) 5-325 MG TABLET    Take 1 tablet by mouth every 6 (six) hours as needed.     Note:  This document was prepared using Dragon voice recognition software and may include unintentional dictation errors.    Joni ReiningSmith, Stefhanie Kachmar K, PA-C 04/24/17 1251    Emily FilbertWilliams, Jonathan E, MD 04/24/17 [phone]

## 2017-04-24 NOTE — ED Triage Notes (Signed)
States he hit his left elbow on Friday  Now having increased pain with some swelling

## 2022-11-25 DEATH — deceased
